# Patient Record
Sex: Female | Born: 1960 | Race: Black or African American | Hispanic: No | Smoking: Former smoker
Health system: Southern US, Community
[De-identification: ages and names within clinical notes are randomized; demographics above are authoritative.]

## PROBLEM LIST (undated history)

## (undated) DIAGNOSIS — F0390 Unspecified dementia without behavioral disturbance: Secondary | ICD-10-CM

## (undated) DIAGNOSIS — E0590 Thyrotoxicosis, unspecified without thyrotoxic crisis or storm: Secondary | ICD-10-CM

## (undated) DIAGNOSIS — I10 Essential (primary) hypertension: Secondary | ICD-10-CM

## (undated) DIAGNOSIS — E119 Type 2 diabetes mellitus without complications: Secondary | ICD-10-CM

## (undated) DIAGNOSIS — T7840XA Allergy, unspecified, initial encounter: Secondary | ICD-10-CM

## (undated) HISTORY — PX: NECK SURGERY: SHX720

## (undated) HISTORY — DX: Allergy, unspecified, initial encounter: T78.40XA

## (undated) HISTORY — DX: Essential (primary) hypertension: I10

## (undated) HISTORY — PX: KNEE SURGERY: SHX244

## (undated) HISTORY — PX: FRACTURE SURGERY: SHX138

## (undated) HISTORY — PX: SHOULDER SURGERY: SHX246

---

## 2016-05-14 NOTE — Congregational Nurse Program (Unsigned)
Congregational Nurse Program Note  Date of Encounter: 05/14/2016  Past Medical History: No past medical history on file.  Encounter Details:     CNP Questionnaire - 05/07/16 1143      Patient Demographics   Is this a new or existing patient? New   Patient is considered a/an Not Applicable   Race Caucasian/White     Patient Assistance   Location of Patient Assistance [facility]   Patient's financial/insurance status Low Income   Uninsured Patient ([place] [facility]/Care Connects) No   Food insecurities addressed Provided food supplies   Transportation assistance No   Assistance securing medications No   Educational health offerings Safety;Nutrition     Encounter Details   Primary purpose of visit Safety;Education/Health Concerns   Was an Emergency Department visit averted? Not Applicable   Does patient have a medical provider? No   Patient referred to Follow up with established PCP   Was a mental health screening completed? (GAINS tool) No   Does patient have dental issues? No   Does patient have vision issues? No   Does your patient have an abnormal blood pressure today? Yes   Since previous encounter, have you referred patient for abnormal blood pressure that resulted in a new diagnosis or medication change? No   Does your patient have an abnormal blood glucose today? No   Since previous encounter, have you referred patient for abnormal blood glucose that resulted in a new diagnosis or medication change? No   Was there a life-saving intervention made? No     Advised to see her Dr.( [place] and check on her B/p meds.  Bp 168/96  Pulse 96 [facility] Setting/Yutaka Holberg,RN  [phone]

## 2016-06-29 NOTE — Congregational Nurse Program (Unsigned)
Congregational Nurse Program Note  Date of Encounter: 06/29/2016  Past Medical History: No past medical history on file.  Encounter Details:     CNP Questionnaire - 06/26/16 1120      Patient Demographics   Is this a new or existing patient? Existing   Patient is considered a/an Not Applicable   Race Caucasian/White     Patient Assistance   Location of Patient Assistance [facility]   Patient's financial/insurance status Private Insurance Coverage   Uninsured Patient ([place] Card/Care Connects) No   Transportation assistance No   Assistance securing medications No   Product/process development scientistducational health offerings Navigating the healthcare system;Safety;Nutrition     Encounter Details   Primary purpose of visit Education/Health Concerns;Safety   Was an Emergency Department visit averted? Not Applicable   Does patient have a medical provider? No   Patient referred to [facility] Agency   Was a mental health screening completed? (GAINS tool) No   Does patient have dental issues? No   Does patient have vision issues? No   Does your patient have an abnormal blood pressure today? Yes   Since previous encounter, have you referred patient for abnormal blood pressure that resulted in a new diagnosis or medication change? Yes   Does your patient have an abnormal blood glucose today? No   Was there a life-saving intervention made? No    06/26/16- Trying to find Insurance Card(says she has 2 cards). Came here from [place]. [facility] Been walking and busy this AM  B/P 158/94 Pulse 98 . Return for recheck of B/P.  Benson SettingLeanna Lawson,RN [phone]

## 2016-09-01 ENCOUNTER — Ambulatory Visit: Payer: Self-pay | Admitting: Physician Assistant

## 2016-10-02 ENCOUNTER — Ambulatory Visit (INDEPENDENT_AMBULATORY_CARE_PROVIDER_SITE_OTHER): Payer: Self-pay | Admitting: Physician Assistant

## 2016-10-02 ENCOUNTER — Encounter (INDEPENDENT_AMBULATORY_CARE_PROVIDER_SITE_OTHER): Payer: Self-pay

## 2016-10-02 ENCOUNTER — Encounter: Payer: Self-pay | Admitting: Physician Assistant

## 2016-10-02 VITALS — BP 157/96 | HR 104 | Temp 97.7°F | Ht 67.0 in | Wt 183.8 lb

## 2016-10-02 DIAGNOSIS — I10 Essential (primary) hypertension: Secondary | ICD-10-CM | POA: Insufficient documentation

## 2016-10-02 DIAGNOSIS — T7840XA Allergy, unspecified, initial encounter: Secondary | ICD-10-CM

## 2016-10-02 DIAGNOSIS — J301 Allergic rhinitis due to pollen: Secondary | ICD-10-CM

## 2016-10-02 HISTORY — DX: Allergy, unspecified, initial encounter: T78.40XA

## 2016-10-02 MED ORDER — PROPRANOLOL HCL ER 80 MG PO CP24: 80.0000 mg | ORAL_CAPSULE | Freq: Every day | ORAL | 1 refills | Status: DC

## 2016-10-02 MED ORDER — LORATADINE 10 MG PO TABS: 10.0000 mg | ORAL_TABLET | Freq: Every day | ORAL | 11 refills | Status: DC

## 2016-10-02 NOTE — Patient Instructions (Signed)
Allergic Rhinitis Allergic rhinitis is when the mucous membranes in the nose respond to allergens. Allergens are particles in the air that cause your body to have an allergic reaction. This causes you to release allergic antibodies. Through a chain of events, these eventually cause you to release histamine into the blood stream. Although meant to protect the body, it is this release of histamine that causes your discomfort, such as frequent sneezing, congestion, and an itchy, runny nose. What are the causes? Seasonal allergic rhinitis (hay fever) is caused by pollen allergens that may come from grasses, trees, and weeds. Year-round allergic rhinitis (perennial allergic rhinitis) is caused by allergens such as house dust mites, pet dander, and mold spores. What are the signs or symptoms?  Nasal stuffiness (congestion).  Itchy, runny nose with sneezing and tearing of the eyes. How is this diagnosed? Your health care provider can help you determine the allergen or allergens that trigger your symptoms. If you and your health care provider are unable to determine the allergen, skin or blood testing may be used. Your health care provider will diagnose your condition after taking your health history and performing a physical exam. Your health care provider may assess you for other related conditions, such as asthma, pink eye, or an ear infection. How is this treated? Allergic rhinitis does not have a cure, but it can be controlled by:  Medicines that block allergy symptoms. These may include allergy shots, nasal sprays, and oral antihistamines.  Avoiding the allergen. Hay fever may often be treated with antihistamines in pill or nasal spray forms. Antihistamines block the effects of histamine. There are over-the-counter medicines that may help with nasal congestion and swelling around the eyes. Check with your health care provider before taking or giving this medicine. If avoiding the allergen or the  medicine prescribed do not work, there are many new medicines your health care provider can prescribe. Stronger medicine may be used if initial measures are ineffective. Desensitizing injections can be used if medicine and avoidance does not work. Desensitization is when a patient is given ongoing shots until the body becomes less sensitive to the allergen. Make sure you follow up with your health care provider if problems continue. Follow these instructions at home: It is not possible to completely avoid allergens, but you can reduce your symptoms by taking steps to limit your exposure to them. It helps to know exactly what you are allergic to so that you can avoid your specific triggers. Contact a health care provider if:  You have a fever.  You develop a cough that does not stop easily (persistent).  You have shortness of breath.  You start wheezing.  Symptoms interfere with normal daily activities. This information is not intended to replace advice given to you by your health care provider. Make sure you discuss any questions you have with your health care provider. Document Released: 01/13/2001 Document Revised: 12/20/2015 Document Reviewed: 12/26/2012 Elsevier Interactive Patient Education  2017 Elsevier Inc.  

## 2016-10-04 NOTE — Progress Notes (Signed)
BP (!) 157/96   Pulse (!) 104   Temp 97.7 F (36.5 C) (Oral)   Ht 5\' 7"  (1.702 m)   Wt 183 lb 12.8 oz (83.4 kg)   BMI 28.79 kg/m    Subjective:    Patient ID: Monica Ayers, female    DOB: Jul 12, 1960, 56 y.o.   MRN: [id number]  Monica Ayers is a 56 y.o. female presenting on 10/02/2016 for New Patient (Initial Visit)  HPI She is a new patient. Prior history positive for hypertension controlled by propranolol. States that she has had some rapid heart rate in the past.  Tolerated the medication well.  Also allergies are quite severe and needs claritin refilled.  There are no other complaints at this time.  Past Medical History:  Diagnosis Date  . Allergy   . Hypertension    Relevant past medical, surgical, family and social history reviewed and updated as indicated. Interim medical history since our last visit reviewed. Allergies and medications reviewed and updated.   Data reviewed from any sources in EPIC.  Review of Systems  Constitutional: Negative.  Negative for activity change, fatigue and fever.  HENT: Negative.   Eyes: Negative.   Respiratory: Negative.  Negative for cough.   Cardiovascular: Negative.  Negative for chest pain.  Gastrointestinal: Negative.  Negative for abdominal pain.  Endocrine: Negative.   Genitourinary: Negative.  Negative for dysuria.  Musculoskeletal: Negative.   Skin: Negative.   Neurological: Negative.      Social History   Social History  . Marital status: Married    Spouse name: N/A  . Number of children: N/A  . Years of education: N/A   Occupational History  . Not on file.   Social History Main Topics  . Smoking status: Current Every Day Smoker    Types: Cigarettes  . Smokeless tobacco: Never Used  . Alcohol use No  . Drug use: No  . Sexual activity: Not on file   Other Topics Concern  . Not on file   Social History Narrative  . No narrative on file    Past Surgical History:  Procedure Laterality Date  .  FRACTURE SURGERY    . KNEE SURGERY Right   . NECK SURGERY    . SHOULDER SURGERY Left     History reviewed. No pertinent family history.  Allergies as of 10/02/2016   No Known Allergies     Medication List       Accurate as of 10/02/16 11:59 PM. Always use your most recent med list.          loratadine 10 MG tablet Commonly known as:  CLARITIN Take 1 tablet (10 mg total) by mouth daily.   propranolol ER 80 MG 24 hr capsule Commonly known as:  INDERAL LA Take 1 capsule (80 mg total) by mouth daily.          Objective:    BP (!) 157/96   Pulse (!) 104   Temp 97.7 F (36.5 C) (Oral)   Ht 5\' 7"  (1.702 m)   Wt 183 lb 12.8 oz (83.4 kg)   BMI 28.79 kg/m   No Known Allergies Wt Readings from Last 3 Encounters:  10/02/16 183 lb 12.8 oz (83.4 kg)    Physical Exam  Constitutional: She is oriented to person, place, and time. She appears well-developed and well-nourished.  HENT:  Head: Normocephalic and atraumatic.  Right Ear: Tympanic membrane, external ear and ear canal normal.  Left Ear: Tympanic membrane, external ear  and ear canal normal.  Nose: Nose normal. No rhinorrhea.  Mouth/Throat: Oropharynx is clear and moist and mucous membranes are normal. No oropharyngeal exudate or posterior oropharyngeal erythema.  Eyes: Conjunctivae and EOM are normal. Pupils are equal, round, and reactive to light.  Neck: Normal range of motion. Neck supple.  Cardiovascular: Normal rate, regular rhythm, normal heart sounds and intact distal pulses.   Pulmonary/Chest: Effort normal and breath sounds normal.  Abdominal: Soft. Bowel sounds are normal.  Neurological: She is alert and oriented to person, place, and time. She has normal reflexes.  Skin: Skin is warm and dry. No rash noted.  Psychiatric: She has a normal mood and affect. Her behavior is normal. Judgment and thought content normal.  Nursing note and vitals reviewed.   No results found for this or any previous visit.      Assessment & Plan:   1. Essential hypertension - propranolol ER (INDERAL LA) 80 MG 24 hr capsule; Take 1 capsule (80 mg total) by mouth daily.  Dispense: 30 capsule; Refill: 1  2. Allergic state, initial encounter - loratadine (CLARITIN) 10 MG tablet; Take 1 tablet (10 mg total) by mouth daily.  Dispense: 30 tablet; Refill: 11   Continue all other maintenance medications as listed above. Educational handout given for hypertension  Follow up plan: Return in about 2 months (around 12/02/2016) for recheck.  Remus Loffler PA-C [facility] [address]  [place], [place] [address] [phone]   10/04/2016, 10:00 PM

## 2016-11-02 ENCOUNTER — Encounter: Payer: Self-pay | Admitting: Physician Assistant

## 2016-11-02 ENCOUNTER — Ambulatory Visit (INDEPENDENT_AMBULATORY_CARE_PROVIDER_SITE_OTHER): Payer: Medicare Other | Admitting: Physician Assistant

## 2016-11-02 VITALS — BP 146/94 | HR 109 | Temp 98.9°F | Ht 67.0 in | Wt 184.8 lb

## 2016-11-02 DIAGNOSIS — N926 Irregular menstruation, unspecified: Secondary | ICD-10-CM

## 2016-11-02 LAB — PREGNANCY, URINE: Preg Test, Ur: NEGATIVE

## 2016-11-03 NOTE — Progress Notes (Signed)
BP (!) 146/94   Pulse (!) 109   Temp 98.9 F (37.2 C) (Oral)   Ht 5\' 7"  (1.702 m)   Wt 184 lb 12.8 oz (83.8 kg)   BMI 28.94 kg/m    Subjective:    Patient ID: Monica Ayers, female    DOB: [birth year], 56 y.o.   MRN: [id number]  HPI: Monica Ayers is a 56 y.o. female presenting on 11/02/2016 for Possible Pregnancy (Last menstrual cycle years ago )  She has been 5 years without a menstrual cycle. She is newly married in the past few months. She has become sexually active again. She was concerned about being pregnant. She states her belly felt full. She has not had any bleeding whatsoever. She has not done a pregnancy test at home. Pregnancy test is performed in the office and is negative. We've had a long discussion about postmenopausal females not been able to get pregnant due to the lack of hormones and eggs being released.  Relevant past medical, surgical, family and social history reviewed and updated as indicated. Allergies and medications reviewed and updated.  Past Medical History:  Diagnosis Date  . Allergy   . Hypertension     Past Surgical History:  Procedure Laterality Date  . FRACTURE SURGERY    . KNEE SURGERY Right   . NECK SURGERY    . SHOULDER SURGERY Left     Review of Systems  Constitutional: Negative.   HENT: Negative.   Eyes: Negative.   Respiratory: Negative.   Gastrointestinal: Negative.   Genitourinary: Negative.     Allergies as of 11/02/2016   No Known Allergies     Medication List       Accurate as of 11/02/16 11:59 PM. Always use your most recent med list.          loratadine 10 MG tablet Commonly known as:  CLARITIN Take 1 tablet (10 mg total) by mouth daily.   propranolol ER 80 MG 24 hr capsule Commonly known as:  INDERAL LA Take 1 capsule (80 mg total) by mouth daily.          Objective:    BP (!) 146/94   Pulse (!) 109   Temp 98.9 F (37.2 C) (Oral)   Ht 5\' 7"  (1.702 m)   Wt 184 lb 12.8 oz (83.8 kg)   BMI 28.94  kg/m   No Known Allergies  Physical Exam  Constitutional: She is oriented to person, place, and time. She appears well-developed and well-nourished.  HENT:  Head: Normocephalic and atraumatic.  Eyes: Conjunctivae and EOM are normal. Pupils are equal, round, and reactive to light.  Cardiovascular: Normal rate, regular rhythm, normal heart sounds and intact distal pulses.   Pulmonary/Chest: Effort normal and breath sounds normal.  Abdominal: Soft. Bowel sounds are normal.  Neurological: She is alert and oriented to person, place, and time. She has normal reflexes.  Skin: Skin is warm and dry. No rash noted.  Psychiatric: She has a normal mood and affect. Her behavior is normal. Judgment and thought content normal.  Nursing note and vitals reviewed.   Results for orders placed or performed in visit on 11/02/16  Pregnancy, urine  Result Value Ref Range   Preg Test, Ur Negative Negative      Assessment & Plan:   1. Irregular menses NEG pregnancy test - Pregnancy, urine Educated on postmenopausal female not getting pregnant. Contact us if any changes in cycle  Continue all other maintenance medications as  listed above.  Follow up plan: Follow-up as needed or worsening of symptoms. Call office for any issues.   Educational handout given for survey  Remus LofflerAngel S. Kieran Nachtigal PA-C [facility] [address]  [place], [place] [address] [phone]   11/03/2016, 12:50 PM

## 2016-12-07 ENCOUNTER — Ambulatory Visit: Payer: Self-pay | Admitting: Physician Assistant

## 2016-12-08 ENCOUNTER — Encounter: Payer: Self-pay | Admitting: Physician Assistant

## 2016-12-08 ENCOUNTER — Telehealth: Payer: Self-pay | Admitting: Physician Assistant

## 2016-12-08 NOTE — Telephone Encounter (Signed)
Detailed message for patient to please call us back to reschedule appointment.

## 2017-02-05 LAB — BASIC METABOLIC PANEL: GLUCOSE: 210

## 2018-10-28 ENCOUNTER — Ambulatory Visit: Payer: Medicare Other | Admitting: Family Medicine

## 2018-10-28 NOTE — Progress Notes (Deleted)
Subjective: WP:[id number] care, hypertension HPI: Monica Ayers is a 58 y.o. female presenting to clinic today for:  1. Hypertension ***  Past Medical History:  Diagnosis Date  . Allergy   . Hypertension    Past Surgical History:  Procedure Laterality Date  . FRACTURE SURGERY    . KNEE SURGERY Right   . NECK SURGERY    . SHOULDER SURGERY Left    Social History   Socioeconomic History  . Marital status: Married    Spouse name: Not on file  . Number of children: Not on file  . Years of education: Not on file  . Highest education level: Not on file  Occupational History  . Not on file  Social Needs  . Financial resource strain: Not on file  . Food insecurity    Worry: Not on file    Inability: Not on file  . Transportation needs    Medical: Not on file    Non-medical: Not on file  Tobacco Use  . Smoking status: Current Every Day Smoker    Types: Cigarettes  . Smokeless tobacco: Never Used  Substance and Sexual Activity  . Alcohol use: No  . Drug use: No  . Sexual activity: Not on file  Lifestyle  . Physical activity    Days per week: Not on file    Minutes per session: Not on file  . Stress: Not on file  Relationships  . Social Herbalist on phone: Not on file    Gets together: Not on file    Attends religious service: Not on file    Active member of club or organization: Not on file    Attends meetings of clubs or organizations: Not on file    Relationship status: Not on file  . Intimate partner violence    Fear of current or ex partner: Not on file    Emotionally abused: Not on file    Physically abused: Not on file    Forced sexual activity: Not on file  Other Topics Concern  . Not on file  Social History Narrative  . Not on file   No outpatient medications have been marked as taking for the 10/28/18 encounter (Appointment) with Janora Norlander, DO.   No family history on file. No Known Allergies   Health Maintenance: ***  Flu Vaccine: {YES/NO/WILD NKNLZ:76734}  Tdap Vaccine: {YES/NO/WILD LPFXT:02409}  - every 31yrs - (<3 lifetime doses or unknown): all wounds -- look up need for Tetanus IG - (>=3 lifetime doses): clean/minor wound if >75yrs from previous; all other wounds if >71yrs from previous Zoster Vaccine: {YES/NO/WILD CARDS:18581} (those >50yo, once) Pneumonia Vaccine: {YES/NO/WILD BDZHG:99242} (those w/ risk factors) - (<47yr) Both: Immunocompromised, cochlear implant, CSF leak, asplenic, sickle cell, Chronic Renal Failure - (<47yr) PPSV-23 only: Heart dz, lung disease, DM, tobacco abuse, alcoholism, cirrhosis/liver disease. - (>28yr): PPSV13 then PPSV23 in 6-12mths;  - (>59yr): repeat PPSV23 once if pt received prior to 58yo and 87yrs have passed  ROS: Per HPI  Objective: Office vital signs reviewed. There were no vitals taken for this visit.  Physical Examination:  General: Awake, alert, *** nourished, No acute distress HEENT: Normal    Neck: No masses palpated. No lymphadenopathy    Ears: Tympanic membranes intact, normal light reflex, no erythema, no bulging    Eyes: PERRLA, extraocular movement in tact, sclera ***    Nose: nasal turbinates moist, *** nasal discharge    Throat: moist mucus membranes, no  erythema, *** tonsillar exudate.  Airway is patent Cardio: regular rate and rhythm, S1S2 heard, no murmurs appreciated Pulm: clear to auscultation bilaterally, no wheezes, rhonchi or rales; normal work of breathing on room air GI: soft, non-tender, non-distended, bowel sounds present x4, no hepatomegaly, no splenomegaly, no masses GU: external vaginal tissue ***, cervix ***, *** punctate lesions on cervix appreciated, *** discharge from cervical os, *** bleeding, *** cervical motion tenderness, *** abdominal/ adnexal masses Extremities: warm, well perfused, No edema, cyanosis or clubbing; +*** pulses bilaterally MSK: *** gait and *** station Skin: dry; intact; no rashes or lesions Neuro: ***  Strength and light touch sensation grossly intact, *** DTRs ***/4  Assessment/ Plan: 58 y.o. female   No problem-specific Assessment & Plan notes found for this encounter.   Monica Ayers Mruk, DO [facility] [phone]

## 2018-11-16 ENCOUNTER — Other Ambulatory Visit: Payer: Self-pay

## 2018-11-17 ENCOUNTER — Encounter (HOSPITAL_COMMUNITY): Payer: Self-pay | Admitting: Emergency Medicine

## 2018-11-17 ENCOUNTER — Ambulatory Visit: Payer: Medicare Other | Admitting: Family Medicine

## 2018-11-17 ENCOUNTER — Other Ambulatory Visit: Payer: Self-pay

## 2018-11-17 ENCOUNTER — Emergency Department (HOSPITAL_COMMUNITY): Payer: Medicare Other

## 2018-11-17 ENCOUNTER — Inpatient Hospital Stay (HOSPITAL_COMMUNITY)
Admission: EM | Admit: 2018-11-17 | Discharge: 2018-11-20 | DRG: 871 | Disposition: A | Discharge status: Home Health | Payer: Medicare Other | Attending: Internal Medicine | Admitting: Internal Medicine

## 2018-11-17 DIAGNOSIS — E119 Type 2 diabetes mellitus without complications: Secondary | ICD-10-CM

## 2018-11-17 DIAGNOSIS — Z1159 Encounter for screening for other viral diseases: Secondary | ICD-10-CM

## 2018-11-17 DIAGNOSIS — F17210 Nicotine dependence, cigarettes, uncomplicated: Secondary | ICD-10-CM | POA: Diagnosis present

## 2018-11-17 DIAGNOSIS — E872 Acidosis, unspecified: Secondary | ICD-10-CM | POA: Diagnosis present

## 2018-11-17 DIAGNOSIS — R402242 Coma scale, best verbal response, confused conversation, at arrival to emergency department: Secondary | ICD-10-CM | POA: Diagnosis present

## 2018-11-17 DIAGNOSIS — G3109 Other frontotemporal dementia: Secondary | ICD-10-CM | POA: Diagnosis present

## 2018-11-17 DIAGNOSIS — G9341 Metabolic encephalopathy: Secondary | ICD-10-CM | POA: Diagnosis present

## 2018-11-17 DIAGNOSIS — L89322 Pressure ulcer of left buttock, stage 2: Secondary | ICD-10-CM | POA: Diagnosis present

## 2018-11-17 DIAGNOSIS — A419 Sepsis, unspecified organism: Secondary | ICD-10-CM

## 2018-11-17 DIAGNOSIS — R740 Nonspecific elevation of levels of transaminase and lactic acid dehydrogenase [LDH]: Secondary | ICD-10-CM | POA: Diagnosis present

## 2018-11-17 DIAGNOSIS — E1165 Type 2 diabetes mellitus with hyperglycemia: Secondary | ICD-10-CM | POA: Diagnosis present

## 2018-11-17 DIAGNOSIS — L89312 Pressure ulcer of right buttock, stage 2: Secondary | ICD-10-CM | POA: Diagnosis present

## 2018-11-17 DIAGNOSIS — L89152 Pressure ulcer of sacral region, stage 2: Secondary | ICD-10-CM | POA: Diagnosis present

## 2018-11-17 DIAGNOSIS — F0280 Dementia in other diseases classified elsewhere without behavioral disturbance: Secondary | ICD-10-CM | POA: Diagnosis present

## 2018-11-17 DIAGNOSIS — R4182 Altered mental status, unspecified: Secondary | ICD-10-CM | POA: Diagnosis present

## 2018-11-17 DIAGNOSIS — Z7984 Long term (current) use of oral hypoglycemic drugs: Secondary | ICD-10-CM

## 2018-11-17 DIAGNOSIS — I639 Cerebral infarction, unspecified: Secondary | ICD-10-CM | POA: Diagnosis not present

## 2018-11-17 DIAGNOSIS — I10 Essential (primary) hypertension: Secondary | ICD-10-CM | POA: Diagnosis present

## 2018-11-17 DIAGNOSIS — R402362 Coma scale, best motor response, obeys commands, at arrival to emergency department: Secondary | ICD-10-CM | POA: Diagnosis present

## 2018-11-17 DIAGNOSIS — R402142 Coma scale, eyes open, spontaneous, at arrival to emergency department: Secondary | ICD-10-CM | POA: Diagnosis present

## 2018-11-17 DIAGNOSIS — R404 Transient alteration of awareness: Secondary | ICD-10-CM

## 2018-11-17 DIAGNOSIS — L8990 Pressure ulcer of unspecified site, unspecified stage: Secondary | ICD-10-CM | POA: Diagnosis present

## 2018-11-17 DIAGNOSIS — J302 Other seasonal allergic rhinitis: Secondary | ICD-10-CM | POA: Diagnosis present

## 2018-11-17 DIAGNOSIS — R7401 Elevation of levels of liver transaminase levels: Secondary | ICD-10-CM | POA: Diagnosis present

## 2018-11-17 DIAGNOSIS — Z91040 Latex allergy status: Secondary | ICD-10-CM | POA: Diagnosis not present

## 2018-11-17 HISTORY — DX: Sepsis, unspecified organism: A41.9

## 2018-11-17 HISTORY — DX: Acidosis, unspecified: E87.20

## 2018-11-17 HISTORY — DX: Type 2 diabetes mellitus without complications: E11.9

## 2018-11-17 HISTORY — DX: Acidosis: E87.2

## 2018-11-17 LAB — RAPID URINE DRUG SCREEN, HOSP PERFORMED
Amphetamines: NOT DETECTED
Barbiturates: NOT DETECTED
Benzodiazepines: NOT DETECTED
Cocaine: NOT DETECTED
Opiates: NOT DETECTED
Tetrahydrocannabinol: NOT DETECTED

## 2018-11-17 LAB — CBC WITH DIFFERENTIAL/PLATELET
Abs Immature Granulocytes: 0.13 10*3/uL — ABNORMAL HIGH (ref 0.00–0.07)
Basophils Absolute: 0.1 10*3/uL (ref 0.0–0.1)
Basophils Relative: 0 %
Eosinophils Absolute: 0 10*3/uL (ref 0.0–0.5)
Eosinophils Relative: 0 %
HCT: 44.5 % (ref 36.0–46.0)
Hemoglobin: 15.3 g/dL — ABNORMAL HIGH (ref 12.0–15.0)
Immature Granulocytes: 1 %
Lymphocytes Relative: 7 %
Lymphs Abs: 1.1 10*3/uL (ref 0.7–4.0)
MCH: 28.9 pg (ref 26.0–34.0)
MCHC: 34.4 g/dL (ref 30.0–36.0)
MCV: 84.1 fL (ref 80.0–100.0)
Monocytes Absolute: 0.8 10*3/uL (ref 0.1–1.0)
Monocytes Relative: 5 %
Neutro Abs: 13.8 10*3/uL — ABNORMAL HIGH (ref 1.7–7.7)
Neutrophils Relative %: 87 %
Platelets: 384 10*3/uL (ref 150–400)
RBC: 5.29 MIL/uL — ABNORMAL HIGH (ref 3.87–5.11)
RDW: 13.6 % (ref 11.5–15.5)
WBC: 15.9 10*3/uL — ABNORMAL HIGH (ref 4.0–10.5)
nRBC: 0 % (ref 0.0–0.2)

## 2018-11-17 LAB — COMPREHENSIVE METABOLIC PANEL
ALT: 52 U/L — ABNORMAL HIGH (ref 0–44)
AST: 56 U/L — ABNORMAL HIGH (ref 15–41)
Albumin: 3.6 g/dL (ref 3.5–5.0)
Alkaline Phosphatase: 73 U/L (ref 38–126)
Anion gap: 13 (ref 5–15)
BUN: 14 mg/dL (ref 6–20)
CO2: 21 mmol/L — ABNORMAL LOW (ref 22–32)
Calcium: 8.9 mg/dL (ref 8.9–10.3)
Chloride: 100 mmol/L (ref 98–111)
Creatinine, Ser: 0.63 mg/dL (ref 0.44–1.00)
GFR calc Af Amer: >60 mL/min (ref >60)
GFR calc non Af Amer: >60 mL/min (ref >60)
Glucose, Bld: 306 mg/dL — ABNORMAL HIGH (ref 70–99)
Potassium: 3.8 mmol/L (ref 3.5–5.1)
Sodium: 134 mmol/L — ABNORMAL LOW (ref 135–145)
Total Bilirubin: 0.8 mg/dL (ref 0.3–1.2)
Total Protein: 7 g/dL (ref 6.5–8.1)

## 2018-11-17 LAB — URINALYSIS, ROUTINE W REFLEX MICROSCOPIC
Bacteria, UA: NONE SEEN
Bilirubin Urine: NEGATIVE
Glucose, UA: >=500 mg/dL — AB
Hgb urine dipstick: NEGATIVE
Ketones, ur: NEGATIVE mg/dL
Leukocytes,Ua: NEGATIVE
Nitrite: NEGATIVE
Protein, ur: NEGATIVE mg/dL
Specific Gravity, Urine: 1.021 (ref 1.005–1.030)
pH: 5 (ref 5.0–8.0)

## 2018-11-17 LAB — LACTIC ACID, PLASMA
Lactic Acid, Venous: 3.3 mmol/L (ref 0.5–1.9)
Lactic Acid, Venous: 3.4 mmol/L (ref 0.5–1.9)

## 2018-11-17 LAB — CBG MONITORING, ED: Glucose-Capillary: 138 mg/dL — ABNORMAL HIGH (ref 70–99)

## 2018-11-17 LAB — MAGNESIUM: Magnesium: 2.1 mg/dL (ref 1.7–2.4)

## 2018-11-17 LAB — PROTIME-INR
INR: 1.1 (ref 0.8–1.2)
Prothrombin Time: 14 seconds (ref 11.4–15.2)

## 2018-11-17 LAB — APTT: aPTT: 29 seconds (ref 24–36)

## 2018-11-17 LAB — PHOSPHORUS: Phosphorus: 4.2 mg/dL (ref 2.5–4.6)

## 2018-11-17 LAB — TROPONIN I (HIGH SENSITIVITY)
Troponin I (High Sensitivity): 8 ng/L (ref <18)
Troponin I (High Sensitivity): 10 ng/L (ref <18)

## 2018-11-17 LAB — ACETAMINOPHEN LEVEL: Acetaminophen (Tylenol), Serum: <10 ug/mL — ABNORMAL LOW (ref 10–30)

## 2018-11-17 LAB — ETHANOL: Alcohol, Ethyl (B): <10 mg/dL (ref <10)

## 2018-11-17 LAB — AMMONIA: Ammonia: 17 umol/L (ref 9–35)

## 2018-11-17 LAB — SARS CORONAVIRUS 2 BY RT PCR (HOSPITAL ORDER, PERFORMED IN ~~LOC~~ HOSPITAL LAB): SARS Coronavirus 2: NEGATIVE

## 2018-11-17 LAB — SALICYLATE LEVEL: Salicylate Lvl: <7 mg/dL (ref 2.8–30.0)

## 2018-11-17 MED ORDER — VANCOMYCIN HCL IN DEXTROSE 1-5 GM/200ML-% IV SOLN: 1000.0000 mg | Freq: Two times a day (BID) | INTRAVENOUS | Status: DC

## 2018-11-17 MED ORDER — CHLORHEXIDINE GLUCONATE CLOTH 2 % EX PADS
6.0000 | MEDICATED_PAD | Freq: Every day | CUTANEOUS | Status: DC
  Administered 2018-11-18: 6 via TOPICAL

## 2018-11-17 MED ORDER — VANCOMYCIN HCL 10 G IV SOLR
1500.0000 mg | Freq: Once | INTRAVENOUS | Status: AC
  Administered 2018-11-17: 1500 mg via INTRAVENOUS
  Filled 2018-11-17 (×2): qty 1500

## 2018-11-17 MED ORDER — METRONIDAZOLE IN NACL 5-0.79 MG/ML-% IV SOLN
500.0000 mg | Freq: Three times a day (TID) | INTRAVENOUS | Status: DC
  Administered 2018-11-17 – 2018-11-18 (×2): 500 mg via INTRAVENOUS
  Filled 2018-11-17 (×2): qty 100

## 2018-11-17 MED ORDER — SODIUM CHLORIDE 0.9 % IV SOLN
2.0000 g | Freq: Once | INTRAVENOUS | Status: AC
  Administered 2018-11-17: 2 g via INTRAVENOUS
  Filled 2018-11-17: qty 2

## 2018-11-17 MED ORDER — SODIUM CHLORIDE 0.9 % IV BOLUS
1000.0000 mL | Freq: Once | INTRAVENOUS | Status: AC
  Administered 2018-11-17: 1000 mL via INTRAVENOUS

## 2018-11-17 MED ORDER — VANCOMYCIN HCL IN DEXTROSE 1-5 GM/200ML-% IV SOLN: 1000.0000 mg | Freq: Once | INTRAVENOUS | Status: DC

## 2018-11-17 MED ORDER — INSULIN ASPART 100 UNIT/ML ~~LOC~~ SOLN: 0.0000 [IU] | Freq: Three times a day (TID) | SUBCUTANEOUS | Status: DC

## 2018-11-17 MED ORDER — SODIUM CHLORIDE 0.9 % IV SOLN
2.0000 g | Freq: Three times a day (TID) | INTRAVENOUS | Status: DC
  Administered 2018-11-18: 2 g via INTRAVENOUS
  Filled 2018-11-17: qty 2

## 2018-11-17 MED ORDER — CHLORHEXIDINE GLUCONATE CLOTH 2 % EX PADS: 6.0000 | MEDICATED_PAD | Freq: Every day | CUTANEOUS | Status: DC

## 2018-11-17 MED ORDER — ENOXAPARIN SODIUM 40 MG/0.4ML ~~LOC~~ SOLN: 40.0000 mg | SUBCUTANEOUS | Status: DC

## 2018-11-17 MED ORDER — POTASSIUM CHLORIDE IN NACL 20-0.45 MEQ/L-% IV SOLN
INTRAVENOUS | Status: DC
  Administered 2018-11-17: via INTRAVENOUS
  Filled 2018-11-17: qty 1000

## 2018-11-17 MED ORDER — SODIUM CHLORIDE 0.9 % IV BOLUS (SEPSIS)
1000.0000 mL | Freq: Once | INTRAVENOUS | Status: AC
  Administered 2018-11-17: 1000 mL via INTRAVENOUS

## 2018-11-17 NOTE — H&P (Addendum)
History and Physical    Monica MassonRochelle M Ashby RUE:[id number] DOB: Sep 15, 1960 DOA: 11/17/2018  PCP: Remus LofflerJones, Angel S, PA-C   Patient coming from: Home.  I have personally briefly reviewed [facility]  Chief Complaint: AMS.  HPI: Monica Ayers is a 58 y.o. female with medical history significant of seasonal allergies, type 2 diabetes, hypertension who is coming to the emergency department brought by her husband due to 3 weeks of progressively worse mental status.  Apparently the patient has not gotten much out of bed, not eating much and not answering questions properly.  She is unable to provide further history.  Her husband denies any history of recent head trauma.  Fever, vomiting or diarrhea to his knowledge.  ED Course: Her initial vital signs were temperature  Her UDS was negative.  Urinalysis showed glucosuria more than 500 mg/dL, but was otherwise normal.  White count was 15.9 with 87% neutrophils, hemoglobin 15.3 g/dL and platelets [id number].  PT/INR/PTT within normal limits.  CMP shows a sodium of 134 and CO2 of 21 mmol/L.  The rest of the electrolytes and renal function are within normal limits.  Hepatic function shows mild elevation of AST and ALT of 56 and 52 units/L respectively, all other tests are within normal limits.  Her lactic acid has been elevated at 3.3 and later 3.4 mmol/L.  Alcohol, salicylate and acetaminophen were resulted within expected values.  SARS coronavirus swab was negative.  High-sensitivity troponin were negative.  TSH was normal.  Vitamin B12 was 315 which is low normal.  Imaging: Hypoventilatory changes and atelectasis were seen on chest radiograph, but no other cardiopulmonary abnormality.  CT head did not show any acute intracranial pathology, but show advanced frontotemporal volume loss.  She has moderate chronic small vessel ischemic disease.  Please see images and full radiology report for further detail.  Review of Systems:  As per HPI otherwise 10 point review of systems negative.   Past Medical History:  Diagnosis Date  . Allergy   . Diabetes mellitus without complication (HCC)   . Hypertension     Past Surgical History:  Procedure Laterality Date  . FRACTURE SURGERY    . KNEE SURGERY Right   . NECK SURGERY    . SHOULDER SURGERY Left      reports that she has been smoking cigarettes. She has never used smokeless tobacco. She reports that she does not drink alcohol or use drugs.  Allergies  Allergen Reactions  . Latex Rash   Family medical history. Unable to obtain.  Prior to Admission medications   Medication Sig Start Date End Date Taking? Authorizing Provider  metFORMIN (GLUCOPHAGE) 500 MG tablet Take 500 mg by mouth daily.  09/30/18 09/30/19 Yes [provider]  sulfamethoxazole-trimethoprim (BACTRIM DS) 800-160 MG tablet Take 1 tablet by mouth 2 (two) times daily. 5 day course prescribed on 11/08/2018   Yes [provider]    Physical Exam: Vitals:   11/17/18 2105 11/17/18 2115 11/17/18 2220 11/17/18 2300  BP:    (!) 151/91  Pulse: 96 100  94  Resp:    (!) 22  Temp:   98.8 F (37.1 C)   TempSrc:   Axillary   SpO2: 97% 95%  97%  Weight:   77.9 kg   Height:   5\' 7"  (1.702 m)     Constitutional: NAD, calm, comfortable Eyes: PERRL, lids and conjunctivae normal ENMT: Mucous membranes are mildly dry.  Partially absent and poor  state of repair dentition.  Posterior pharynx clear of any exudate or lesions. Neck: normal, supple, no masses, no thyromegaly Respiratory: Decreased breath sounds on bases, otherwise clear to auscultation bilaterally, no wheezing, no crackles. Normal respiratory effort. No accessory muscle use.  Cardiovascular: Regular rate and rhythm, no murmurs / rubs / gallops. No extremity edema. 2+ pedal pulses. No carotid bruits.  Abdomen: Soft, no tenderness, no masses palpated. No hepatosplenomegaly. Bowel sounds positive.  Musculoskeletal: no clubbing /  cyanosis. Good ROM, no contractures. Normal muscle tone.  Skin: Stage II  pressure injury of sacrum and buttocks. Neurologic: CN 2-12 grossly intact. Sensation intact, DTR normal. Strength 5/5 in all 4.  Psychiatric: Alert, did not answer questions appropriately.  She was unable to tell me her name.  Labs on Admission: I have personally reviewed following labs and imaging studies  CBC: Recent Labs  Lab 11/17/18 1529  WBC 15.9*  NEUTROABS 13.8*  HGB 15.3*  HCT 44.5  MCV 84.1  PLT 505   Basic Metabolic Panel: Recent Labs  Lab 11/17/18 1529  NA 134*  K 3.8  CL 100  CO2 21*  GLUCOSE 306*  BUN 14  CREATININE 0.63  CALCIUM 8.9  MG 2.1  PHOS 4.2   GFR: Estimated Creatinine Clearance: 82.4 mL/min (by C-G formula based on SCr of 0.63 mg/dL). Liver Function Tests: Recent Labs  Lab 11/17/18 1529  AST 56*  ALT 52*  ALKPHOS 73  BILITOT 0.8  PROT 7.0  ALBUMIN 3.6   No results for input(s): LIPASE, AMYLASE in the last 168 hours. Recent Labs  Lab 11/17/18 1752  AMMONIA 17   Coagulation Profile: Recent Labs  Lab 11/17/18 2000  INR 1.1   Cardiac Enzymes: No results for input(s): CKTOTAL, CKMB, CKMBINDEX, TROPONINI in the last 168 hours. BNP (last 3 results) No results for input(s): PROBNP in the last 8760 hours. HbA1C: No results for input(s): HGBA1C in the last 72 hours. CBG: Recent Labs  Lab 11/17/18 2123  GLUCAP 138*   Lipid Profile: No results for input(s): CHOL, HDL, LDLCALC, TRIG, CHOLHDL, LDLDIRECT in the last 72 hours. Thyroid Function Tests: No results for input(s): TSH, T4TOTAL, FREET4, T3FREE, THYROIDAB in the last 72 hours. Anemia Panel: No results for input(s): VITAMINB12, FOLATE, FERRITIN, TIBC, IRON, RETICCTPCT in the last 72 hours. Urine analysis:    Component Value Date/Time   COLORURINE YELLOW 11/17/2018 1747   APPEARANCEUR CLEAR 11/17/2018 1747   LABSPEC 1.021 11/17/2018 1747   PHURINE 5.0 11/17/2018 1747   GLUCOSEU >=500 (A)  11/17/2018 1747   HGBUR NEGATIVE 11/17/2018 1747   BILIRUBINUR NEGATIVE 11/17/2018 1747   KETONESUR NEGATIVE 11/17/2018 1747   PROTEINUR NEGATIVE 11/17/2018 1747   NITRITE NEGATIVE 11/17/2018 1747   LEUKOCYTESUR NEGATIVE 11/17/2018 1747    Radiological Exams on Admission: Ct Head Wo Contrast  Result Date: 11/17/2018 CLINICAL DATA:  Altered mental status. EXAM: CT HEAD WITHOUT CONTRAST TECHNIQUE: Contiguous axial images were obtained from the base of the skull through the vertex without intravenous contrast. COMPARISON:  11/05/2018 FINDINGS: Brain: There is no evidence of acute infarct, intracranial hemorrhage, mass, midline shift, or extra-axial fluid collection. Advanced bilateral frontal and temporal lobe volume loss is again seen. Patchy cerebral white matter hypodensities are unchanged and nonspecific but compatible with moderate chronic small vessel ischemic disease. 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 Vascular: Calcified atherosclerosis at the skull base. No hyperdense vessel. Skull: No fracture or focal osseous lesion. Sinuses/Orbits: Visualized paranasal sinuses and mastoid  air cells are clear. Visualized orbits are unremarkable. Other: None. IMPRESSION: 1. No evidence of acute intracranial abnormality. 2. Advanced frontotemporal volume loss. 3. Moderate chronic small vessel ischemic disease. Electronically Signed   By: Sebastian AcheAllen  Grady M.D.   On: 11/17/2018 19:05   Dg Chest Port 1 View  Result Date: 11/17/2018 CLINICAL DATA:  Altered level of consciousness EXAM: PORTABLE CHEST 1 VIEW COMPARISON:  Chest radiograph 10/06/2018 FINDINGS: Hypoventilatory changes in the lungs with vascular crowding. No consolidation, features of edema, pneumothorax, or effusion. Cardiomediastinal contours are unremarkable. No acute osseous or soft tissue abnormality. The likely postsurgical changes from prior left distal clavicular resection. Mass where the base the neck.  Cervical fusion hardware, largely collimated from view. IMPRESSION: Hypoventilatory changes and atelectasis, otherwise no acute cardiopulmonary abnormality Electronically Signed   By: Kreg ShropshirePrice  DeHay M.D.   On: 11/17/2018 16:39    EKG: Independently reviewed.  Vent. rate 100 BPM PR interval * ms QRS duration 87 ms QT/QTc 323/417 ms P-R-T axes 85 43 244 Sinus tachycardia Consider left atrial enlargement Abnormal R-wave progression, early transition Borderline repolarization abnormality  Assessment/Plan Principal Problem:   Sepsis due to undetermined organism (HCC) Admit to stepdown/inpatient. Continue IV fluids. Follow-up lactic acid level. Continue cefepime per pharmacy. Continue vancomycin per pharmacy. Continue metronidazole 500 mg IVPB every 8 hours. Follow-up blood cultures and sensitivity.  Active Problems:   Essential hypertension Not on medications at home. Monitor blood pressure. IV labetalol as needed.    Lactic acidosis Continue IV fluids. Follow-up lactic acid level.    Type 2 diabetes mellitus (HCC) Hyperglycemia has responded to IV fluids. Hold metformin due to lactic acidosis. Carbohydrate modified diet. CBG monitoring with regular insulin sliding scale.    Frontal lobe dementia (HCC) Likely the reason for the patient's inappropriate smiling/laughing. Check TSH and vitamin B12. Check MRI of brain.    Pressure injury of skin II   (present on admission) Continue local care.    Transaminasemia Follow AST and ALT.   DVT prophylaxis: On warfarin. Code Status: Full code. Family Communication: I spoke to her husband after seeing the patient in stepdown. Disposition Plan: Admit for sepsis work-up and treatment. Consults called: Admission status: Inpatient/stepdown.   Bobette Moavid Manuel Dmarius Reeder MD [facility]  If 7PM-7AM, please contact night-coverage [email or web address]  11/17/2018, 11:49 PM   This document was prepared using Dragon voice recognition  software and may contain some unintended transcription errors.

## 2018-11-17 NOTE — ED Provider Notes (Addendum)
[facility] EMERGENCY DEPARTMENT Provider Note   CSN: [id number] Arrival date & time: 11/17/18  1437     History   Chief Complaint Chief Complaint  Patient presents with  . Altered Mental Status    HPI Monica Ayers is a 58 y.o. female.     Patient is a 58 year old female with past medical history of NIDDM, HTN.  She was sent here from her PCP's office for evaluation of ALOC, possible UTI.  Patient appears confused and adds little useful history.  From what I understand, she [facility] and by her pcp for similar symptoms.  She appeared confused there, then was sent here.  Am I also told she is sleeping a lot at home and has been urinating on herself.  The history is provided by the patient.  Altered Mental Status Presenting symptoms: behavior changes and disorientation   Severity:  Moderate Episode history:  Continuous Timing:  Constant Progression:  Worsening Chronicity:  New   Past Medical History:  Diagnosis Date  . Allergy   . Diabetes mellitus without complication (HCC)   . Hypertension     Patient Active Problem List   Diagnosis Date Noted  . Essential hypertension 10/02/2016  . Allergic state 10/02/2016    Past Surgical History:  Procedure Laterality Date  . FRACTURE SURGERY    . KNEE SURGERY Right   . NECK SURGERY    . SHOULDER SURGERY Left      OB History   No obstetric history on file.      Home Medications    Prior to Admission medications   Medication Sig Start Date End Date Taking? Authorizing Provider  metFORMIN (GLUCOPHAGE) 500 MG tablet Take by mouth. 09/30/18 09/30/19 Yes [provider]  loratadine (CLARITIN) 10 MG tablet Take 1 tablet (10 mg total) by mouth daily. 10/02/16   Remus LofflerJones, Angel S, PA-C  propranolol ER (INDERAL LA) 80 MG 24 hr capsule Take 1 capsule (80 mg total) by mouth daily. 10/02/16   Remus LofflerJones, Angel S, PA-C    Family History No family history on file.  Social History Social History    Tobacco Use  . Smoking status: Current Every Day Smoker    Types: Cigarettes  . Smokeless tobacco: Never Used  Substance Use Topics  . Alcohol use: No  . Drug use: No     Allergies   Latex   Review of Systems Review of Systems  Unable to perform ROS: Mental status change     Physical Exam Updated Vital Signs BP (!) 145/84 (BP Location: Right Arm)   Pulse (!) 123   Temp 97.6 F (36.4 C) (Oral)   Resp 16   Ht 5\' 7"  (1.702 m)   Wt 83.9 kg   SpO2 96%   BMI 28.98 kg/m   Physical Exam Vitals signs and nursing note reviewed.  Constitutional:      General: She is not in acute distress.    Appearance: She is well-developed. She is ill-appearing and diaphoretic.  HENT:     Head: Normocephalic and atraumatic.     Mouth/Throat:     Mouth: Mucous membranes are moist.     Pharynx: No oropharyngeal exudate.  Neck:     Musculoskeletal: Normal range of motion and neck supple.  Cardiovascular:     Rate and Rhythm: Normal rate and regular rhythm.     Heart sounds: No murmur. No friction rub. No gallop.   Pulmonary:  Effort: Pulmonary effort is normal. No respiratory distress.     Breath sounds: Normal breath sounds. No wheezing.  Abdominal:     General: Bowel sounds are normal. There is no distension.     Palpations: Abdomen is soft.     Tenderness: There is no abdominal tenderness.  Musculoskeletal: Normal range of motion.  Skin:    General: Skin is warm.  Neurological:     Mental Status: She is alert.     Comments: Patient is awake and alert, but appears confused.  She moves all extremities, but remainder of neuro exam difficult secondary to aloc.      ED Treatments / Results  Labs (all labs ordered are listed, but only abnormal results are displayed) Labs Reviewed  COMPREHENSIVE METABOLIC PANEL  CBC WITH DIFFERENTIAL/PLATELET  ETHANOL  SALICYLATE LEVEL  ACETAMINOPHEN LEVEL  LACTIC ACID, PLASMA  LACTIC ACID, PLASMA  URINALYSIS, ROUTINE W REFLEX  MICROSCOPIC  RAPID URINE DRUG SCREEN, HOSP PERFORMED    EKG EKG Interpretation  Date/Time:  Thursday November 17 2018 20:18:41 EDT Ventricular Rate:  100 PR Interval:    QRS Duration: 87 QT Interval:  323 QTC Calculation: 417 R Axis:   43 Text Interpretation:  Sinus tachycardia Consider left atrial enlargement Abnormal R-wave progression, early transition Borderline repolarization abnormality Confirmed by Geoffery LyonseLo, Almena Hokenson ([id number]) on 11/17/2018 8:47:10 PM   Radiology No results found.  Procedures Procedures (including critical care time)  Medications Ordered in ED Medications  sodium chloride 0.9 % bolus 1,000 mL (has no administration in time range)     Initial Impression / Assessment and Plan / ED Course  I have reviewed the triage vital signs and the nursing notes.  Pertinent labs & imaging results that were available during my care of the patient were reviewed by me and considered in my medical decision making (see chart for details).  Patient is a 58 year old female presenting with a 3-week history of progressive weakness and confusion.  According to the patient's husband, she has not gotten out of bed in 3 weeks.  At one point, he attempted to get her to the couch in the living room, however she was so weak she fell over and landed on the floor.  Patient was seen at the doctor's office today, then referred here for further evaluation.  Upon my initial assessment, the patient appeared very pale and diaphoretic.  She has been tachycardic, but not febrile or hypotensive.  Patient's neurologic exam shows generalized slowing and her affect is somewhat unusual.  She has episodes of laughter that are somewhat inappropriate, but neurologic exam is otherwise nonfocal.  Her work-up shows no obvious abnormality.  Her blood sugar is somewhat elevated, but electrolytes are otherwise unremarkable.  She does have a white count of 15.9, the significance of which I am uncertain.  Her chest x-ray is  clear and urinalysis shows no infection.  She also has no drugs of abuse showing positive in her urine and alcohol level is undetectable.  At this point, patient has altered mental status, the etiology of which I am uncertain.  It does not appear as though this patient can go home secondary to the degree of her weakness.  I have discussed the care with Dr. Robb Matarrtiz who agrees to admit for further work-up and observation.  Final Clinical Impressions(s) / ED Diagnoses   Final diagnoses:  None    ED Discharge Orders    None       Geoffery Lyonselo, Lc Joynt, MD 11/17/18 2042  Veryl Speak, MD 11/17/18 2047

## 2018-11-17 NOTE — ED Triage Notes (Signed)
Pt brought in for aloc she is not walking she just voids on herself she was seen at [facility] on 11/08/2018 dx with a uti her doctor sent her over to be checked out

## 2018-11-17 NOTE — Progress Notes (Signed)
Lab called to make aware pts Lactic acid is critical at 3.4- from 1830. Heather RN, made aware via this nurse. Pt is due for another lactic acid any time.

## 2018-11-17 NOTE — ED Notes (Addendum)
CRITICAL VALUE ALERT  Critical Value:  Lactic 3.3  Date & Time Notied:  11/17/2018 1620  Provider Notified: Dr. Stark Jock  Orders Received/Actions taken: None yet

## 2018-11-17 NOTE — Progress Notes (Signed)
Pharmacy Antibiotic Note  MAIMOUNA RONDEAU is a 58 y.o. female admitted on 11/17/2018 with infection- unknown source.  Pharmacy has been consulted for Vancomycin and Cefepime dosing.  Plan: Vancomycin 1500 mg IV x 1 dose. Vancomycin 1000 mg IV every 12 hours.  Goal trough 15-20 mcg/mL. Cefepime 2000 mg IV every 8 hours. Monitor labs, c/s, and vanco levels as indicated.  Height: 5\' 7"  (170.2 cm) Weight: 185 lb (83.9 kg) IBW/kg (Calculated) : 61.6  Temp (24hrs), Avg:97.6 F (36.4 C), Min:97.6 F (36.4 C), Max:97.6 F (36.4 C)  Recent Labs  Lab 11/17/18 1529  WBC 15.9*  CREATININE 0.63  LATICACIDVEN 3.3*    Estimated Creatinine Clearance: 85.3 mL/min (by C-G formula based on SCr of 0.63 mg/dL).    Allergies  Allergen Reactions  . Latex Rash    Antimicrobials this admission: Vanco 7/16 >>  Cefepime 7/16 >>  Flagyl 7/16 >>  Dose adjustments this admission: N/A  Microbiology results: 7/16 BCx: pending     Thank you for allowing pharmacy to be a part of this patient's care.  Ramond Craver 11/17/2018 9:30 PM

## 2018-11-18 ENCOUNTER — Inpatient Hospital Stay (HOSPITAL_COMMUNITY): Payer: Medicare Other

## 2018-11-18 DIAGNOSIS — R7401 Elevation of levels of liver transaminase levels: Secondary | ICD-10-CM | POA: Diagnosis present

## 2018-11-18 DIAGNOSIS — I639 Cerebral infarction, unspecified: Secondary | ICD-10-CM

## 2018-11-18 DIAGNOSIS — L8990 Pressure ulcer of unspecified site, unspecified stage: Secondary | ICD-10-CM | POA: Diagnosis present

## 2018-11-18 HISTORY — DX: Pressure ulcer of unspecified site, unspecified stage: L89.90

## 2018-11-18 LAB — COMPREHENSIVE METABOLIC PANEL
ALT: 42 U/L (ref 0–44)
AST: 40 U/L (ref 15–41)
Albumin: 3.5 g/dL (ref 3.5–5.0)
Alkaline Phosphatase: 66 U/L (ref 38–126)
Anion gap: 10 (ref 5–15)
BUN: 7 mg/dL (ref 6–20)
CO2: 21 mmol/L — ABNORMAL LOW (ref 22–32)
Calcium: 8.4 mg/dL — ABNORMAL LOW (ref 8.9–10.3)
Chloride: 108 mmol/L (ref 98–111)
Creatinine, Ser: 0.51 mg/dL (ref 0.44–1.00)
GFR calc Af Amer: >60 mL/min (ref >60)
GFR calc non Af Amer: >60 mL/min (ref >60)
Glucose, Bld: 169 mg/dL — ABNORMAL HIGH (ref 70–99)
Potassium: 3.9 mmol/L (ref 3.5–5.1)
Sodium: 139 mmol/L (ref 135–145)
Total Bilirubin: 0.5 mg/dL (ref 0.3–1.2)
Total Protein: 7 g/dL (ref 6.5–8.1)

## 2018-11-18 LAB — BLOOD CULTURE ID PANEL (REFLEXED)

## 2018-11-18 LAB — LIPID PANEL
Cholesterol: 184 mg/dL (ref 0–200)
HDL: 34 mg/dL — ABNORMAL LOW (ref >40)
LDL Cholesterol: 111 mg/dL — ABNORMAL HIGH (ref 0–99)
Total CHOL/HDL Ratio: 5.4 RATIO
Triglycerides: 195 mg/dL — ABNORMAL HIGH (ref <150)
VLDL: 39 mg/dL (ref 0–40)

## 2018-11-18 LAB — GLUCOSE, CAPILLARY
Glucose-Capillary: 162 mg/dL — ABNORMAL HIGH (ref 70–99)
Glucose-Capillary: 186 mg/dL — ABNORMAL HIGH (ref 70–99)
Glucose-Capillary: 148 mg/dL — ABNORMAL HIGH (ref 70–99)
Glucose-Capillary: 146 mg/dL — ABNORMAL HIGH (ref 70–99)

## 2018-11-18 LAB — CBC WITH DIFFERENTIAL/PLATELET
Abs Immature Granulocytes: 0.06 10*3/uL (ref 0.00–0.07)
Basophils Absolute: 0 10*3/uL (ref 0.0–0.1)
Basophils Relative: 0 %
Eosinophils Absolute: 0.1 10*3/uL (ref 0.0–0.5)
Eosinophils Relative: 1 %
HCT: 42.3 % (ref 36.0–46.0)
Hemoglobin: 13.8 g/dL (ref 12.0–15.0)
Immature Granulocytes: 1 %
Lymphocytes Relative: 19 %
Lymphs Abs: 1.6 10*3/uL (ref 0.7–4.0)
MCH: 28.2 pg (ref 26.0–34.0)
MCHC: 32.6 g/dL (ref 30.0–36.0)
MCV: 86.5 fL (ref 80.0–100.0)
Monocytes Absolute: 0.6 10*3/uL (ref 0.1–1.0)
Monocytes Relative: 7 %
Neutro Abs: 6.1 10*3/uL (ref 1.7–7.7)
Neutrophils Relative %: 72 %
Platelets: 341 10*3/uL (ref 150–400)
RBC: 4.89 MIL/uL (ref 3.87–5.11)
RDW: 13.8 % (ref 11.5–15.5)
WBC: 8.5 10*3/uL (ref 4.0–10.5)
nRBC: 0 % (ref 0.0–0.2)

## 2018-11-18 LAB — MRSA PCR SCREENING: MRSA by PCR: NEGATIVE

## 2018-11-18 LAB — HEMOGLOBIN A1C
Hgb A1c MFr Bld: 6.7 % — ABNORMAL HIGH (ref 4.8–5.6)
Mean Plasma Glucose: 145.59 mg/dL

## 2018-11-18 LAB — LACTIC ACID, PLASMA
Lactic Acid, Venous: 1 mmol/L (ref 0.5–1.9)
Lactic Acid, Venous: 1.6 mmol/L (ref 0.5–1.9)

## 2018-11-18 LAB — PROCALCITONIN: Procalcitonin: <0.1 ng/mL

## 2018-11-18 LAB — VITAMIN B12: Vitamin B-12: 315 pg/mL (ref 180–914)

## 2018-11-18 LAB — TSH: TSH: 0.427 u[IU]/mL (ref 0.350–4.500)

## 2018-11-18 MED ORDER — INSULIN ASPART 100 UNIT/ML ~~LOC~~ SOLN
0.0000 [IU] | Freq: Three times a day (TID) | SUBCUTANEOUS | Status: DC
  Administered 2018-11-18: 1 [IU] via SUBCUTANEOUS
  Administered 2018-11-19: 2 [IU] via SUBCUTANEOUS
  Administered 2018-11-19: 3 [IU] via SUBCUTANEOUS
  Administered 2018-11-20: 2 [IU] via SUBCUTANEOUS

## 2018-11-18 MED ORDER — INSULIN ASPART 100 UNIT/ML ~~LOC~~ SOLN: 0.0000 [IU] | Freq: Every day | SUBCUTANEOUS | Status: DC

## 2018-11-18 MED ORDER — ASPIRIN EC 325 MG PO TBEC
325.0000 mg | DELAYED_RELEASE_TABLET | Freq: Every day | ORAL | Status: DC
  Administered 2018-11-18 – 2018-11-20 (×3): 325 mg via ORAL
  Filled 2018-11-18 (×3): qty 1

## 2018-11-18 MED ORDER — LORAZEPAM 2 MG/ML IJ SOLN
1.0000 mg | Freq: Once | INTRAMUSCULAR | Status: AC
  Administered 2018-11-18: 1 mg via INTRAMUSCULAR
  Filled 2018-11-18: qty 1

## 2018-11-18 MED ORDER — SODIUM CHLORIDE 0.9 % IV SOLN: INTRAVENOUS | Status: DC | PRN

## 2018-11-18 MED ORDER — ENOXAPARIN SODIUM 40 MG/0.4ML ~~LOC~~ SOLN
40.0000 mg | SUBCUTANEOUS | Status: DC
  Administered 2018-11-18 – 2018-11-19 (×2): 40 mg via SUBCUTANEOUS
  Filled 2018-11-18 (×2): qty 0.4

## 2018-11-18 MED ORDER — ATORVASTATIN CALCIUM 40 MG PO TABS
40.0000 mg | ORAL_TABLET | Freq: Every day | ORAL | Status: DC
  Administered 2018-11-18 – 2018-11-19 (×2): 40 mg via ORAL
  Filled 2018-11-18 (×2): qty 1

## 2018-11-18 MED ORDER — SODIUM CHLORIDE 0.9 % IV SOLN
INTRAVENOUS | Status: DC | PRN
  Administered 2018-11-18: 500 mL via INTRAVENOUS

## 2018-11-18 NOTE — Progress Notes (Signed)
PROGRESS NOTE    Monica MassonRochelle M Raus  ZOX:[id number] DOB: 06/24/1960 DOA: 11/17/2018 PCP: Remus LofflerJones, Angel S, PA-C   Brief Narrative:  Per HPI: Monica Ayers is a 58 y.o. female with medical history significant of seasonal allergies, type 2 diabetes, hypertension who is coming to the emergency department brought by her husband due to 3 weeks of progressively worse mental status.  Apparently the patient has not gotten much out of bed, not eating much and not answering questions properly.  She is unable to provide further history.  Her husband denies any history of recent head trauma.  Fever, vomiting or diarrhea to his knowledge.  Patient was admitted with acute encephalopathy suspected to be metabolic secondary to sepsis and was started on cefepime, vancomycin, and Flagyl.  She was noted to have elevated lactic acidosis as well.  She had remained on some IV fluids.  She has undergone MRI of the brain demonstrating acute, deep white matter CVAs this morning.  She is already noted to have frontal lobe dementia with prior closed head injury.  Assessment & Plan:   Principal Problem:   Sepsis due to undetermined organism [facility](HCC) Active Problems:   Essential hypertension   Lactic acidosis   Type 2 diabetes mellitus (HCC)   Frontal lobe dementia (HCC)   Pressure injury of skin   Transaminasemia   Acute encephalopathy secondary to bilateral deep white matter CVA in the setting of frontal lobe dementia -Further evaluation with 2D echocardiogram, carotid ultrasound, and lipid panel -Hemoglobin A1c noted to be 6.7% -We will start on full dose aspirin as well as statin -Appreciate neurology evaluation -Vitamin B12 and TSH within normal limits  Lactic acidosis-improving -Likely secondary to above with no sign of sepsis currently noted -We will continue to trend and discontinue antibiotics -Maintain on IV fluid  Type 2 diabetes with mild hyperglycemia -Continue to hold metformin due to lactic  acidosis -Maintain on carb modified diet -Started SSI  Essential hypertension -Allow permissive hypertension for now with no medications noted at home  Transaminitis -Improved on repeat labs and will start statin -Recheck levels in a.m.   DVT prophylaxis: Lovenox Code Status: Full Family Communication: We will plan to call husband Disposition Plan: Continue CVA evaluation with neurology consulted.  Discontinue antibiotics.   Consultants:   Neurology  Procedures:   Brain MRI 7/17  Antimicrobials:  Anti-infectives (From admission, onward)   Start     Dose/Rate Route Frequency Ordered Stop   11/18/18 1100  vancomycin (VANCOCIN) IVPB 1000 mg/200 mL premix  Status:  Discontinued     1,000 mg 200 mL/hr over 60 Minutes Intravenous Every 12 hours 11/17/18 2126 11/18/18 0942   11/18/18 0600  ceFEPIme (MAXIPIME) 2 g in sodium chloride 0.9 % 100 mL IVPB  Status:  Discontinued     2 g 200 mL/hr over 30 Minutes Intravenous Every 8 hours 11/17/18 2123 11/18/18 1206   11/17/18 2100  ceFEPIme (MAXIPIME) 2 g in sodium chloride 0.9 % 100 mL IVPB     2 g 200 mL/hr over 30 Minutes Intravenous  Once 11/17/18 2009 11/17/18 2335   11/17/18 2100  metroNIDAZOLE (FLAGYL) IVPB 500 mg  Status:  Discontinued     500 mg 100 mL/hr over 60 Minutes Intravenous Every 8 hours 11/17/18 2009 11/18/18 1206   11/17/18 2030  vancomycin (VANCOCIN) IVPB 1000 mg/200 mL premix  Status:  Discontinued     1,000 mg 200 mL/hr over 60 Minutes Intravenous  Once 11/17/18 2009 11/17/18 2015  11/17/18 2015  vancomycin (VANCOCIN) 1,500 mg in sodium chloride 0.9 % 500 mL IVPB     1,500 mg 250 mL/hr over 120 Minutes Intravenous  Once 11/17/18 2015 11/18/18 0111      Subjective: Patient seen and evaluated today and appears to be in good spirits.  Difficult to assess actual clinical condition given patient condition.  Objective: Vitals:   11/18/18 0600 11/18/18 0700 11/18/18 0815 11/18/18 1049  BP: (!) 155/83 (!)  164/79  (!) 167/86  Pulse: 89 89  94  Resp: 15 15  20   Temp:   98.2 F (36.8 C) 98.4 F (36.9 C)  TempSrc:   Oral   SpO2: 95% 94%  98%  Weight:      Height:        Intake/Output Summary (Last 24 hours) at 11/18/2018 1211 Last data filed at 11/18/2018 0900 Gross per 24 hour  Intake 2761.84 ml  Output --  Net 2761.84 ml   Filed Weights   11/17/18 1441 11/17/18 2220 11/18/18 0500  Weight: 83.9 kg 77.9 kg 77.9 kg    Examination:  General exam: Appears calm and comfortable, appears confused Respiratory system: Clear to auscultation. Respiratory effort normal. Cardiovascular system: S1 & S2 heard, RRR. No JVD, murmurs, rubs, gallops or clicks. No pedal edema. Gastrointestinal system: Abdomen is nondistended, soft and nontender. No organomegaly or masses felt. Normal bowel sounds heard. Central nervous system: Alert and awake.  Confused Extremities: Symmetric 5 x 5 power. Skin: No rashes, lesions or ulcers Psychiatry: Difficult to assess    Data Reviewed: I have personally reviewed following labs and imaging studies  CBC: Recent Labs  Lab 11/17/18 1529 11/18/18 0800  WBC 15.9* 8.5  NEUTROABS 13.8* 6.1  HGB 15.3* 13.8  HCT 44.5 42.3  MCV 84.1 86.5  PLT 384 341   Basic Metabolic Panel: Recent Labs  Lab 11/17/18 1529 11/18/18 0800  NA 134* 139  K 3.8 3.9  CL 100 108  CO2 21* 21*  GLUCOSE 306* 169*  BUN 14 7  CREATININE 0.63 0.51  CALCIUM 8.9 8.4*  MG 2.1  --   PHOS 4.2  --    GFR: Estimated Creatinine Clearance: 82.4 mL/min (by C-G formula based on SCr of 0.51 mg/dL). Liver Function Tests: Recent Labs  Lab 11/17/18 1529 11/18/18 0800  AST 56* 40  ALT 52* 42  ALKPHOS 73 66  BILITOT 0.8 0.5  PROT 7.0 7.0  ALBUMIN 3.6 3.5   No results for input(s): LIPASE, AMYLASE in the last 168 hours. Recent Labs  Lab 11/17/18 1752  AMMONIA 17   Coagulation Profile: Recent Labs  Lab 11/17/18 2000  INR 1.1   Cardiac Enzymes: No results for input(s):  CKTOTAL, CKMB, CKMBINDEX, TROPONINI in the last 168 hours. BNP (last 3 results) No results for input(s): PROBNP in the last 8760 hours. HbA1C: Recent Labs    11/17/18 1529  HGBA1C 6.7*   CBG: Recent Labs  Lab 11/17/18 2123 11/18/18 0806 11/18/18 1122  GLUCAP 138* 162* 186*   Lipid Profile: No results for input(s): CHOL, HDL, LDLCALC, TRIG, CHOLHDL, LDLDIRECT in the last 72 hours. Thyroid Function Tests: Recent Labs    11/17/18 2059  TSH 0.427   Anemia Panel: Recent Labs    11/17/18 2059  VITAMINB12 315   Sepsis Labs: Recent Labs  Lab 11/17/18 1529 11/17/18 2057 11/18/18 0225 11/18/18 0800  PROCALCITON  --   --   --  <0.10  LATICACIDVEN 3.3* 3.4* 1.0 1.6    Recent  Results (from the past 240 hour(s))  SARS Coronavirus 2 (CEPHEID - Performed in [facility] hospital lab), Hosp Order     Status: None   Collection Time: 11/17/18  8:40 PM   Specimen: Nasopharyngeal Swab  Result Value Ref Range Status   SARS Coronavirus 2 NEGATIVE NEGATIVE Final    Comment: (NOTE) If result is NEGATIVE SARS-CoV-2 target nucleic acids are NOT DETECTED. The SARS-CoV-2 RNA is generally detectable in upper and lower  respiratory specimens during the acute phase of infection. The lowest  concentration of SARS-CoV-2 viral copies this assay can detect is 250  copies / mL. A negative result does not preclude SARS-CoV-2 infection  and should not be used as the sole basis for treatment or other  patient management decisions.  A negative result may occur with  improper specimen collection / handling, submission of specimen other  than nasopharyngeal swab, presence of viral mutation(s) within the  areas targeted by this assay, and inadequate number of viral copies  (<250 copies / mL). A negative result must be combined with clinical  observations, patient history, and epidemiological information. If result is POSITIVE SARS-CoV-2 target nucleic acids are DETECTED. The SARS-CoV-2 RNA is  generally detectable in upper and lower  respiratory specimens dur ing the acute phase of infection.  Positive  results are indicative of active infection with SARS-CoV-2.  Clinical  correlation with patient history and other diagnostic information is  necessary to determine patient infection status.  Positive results do  not rule out bacterial infection or co-infection with other viruses. If result is PRESUMPTIVE POSTIVE SARS-CoV-2 nucleic acids MAY BE PRESENT.   A presumptive positive result was obtained on the submitted specimen  and confirmed on repeat testing.  While 2019 novel coronavirus  (SARS-CoV-2) nucleic acids may be present in the submitted sample  additional confirmatory testing may be necessary for epidemiological  and / or clinical management purposes  to differentiate between  SARS-CoV-2 and other Sarbecovirus currently known to infect humans.  If clinically indicated additional testing with an alternate test  methodology [id number]) is advised. The SARS-CoV-2 RNA is generally  detectable in upper and lower respiratory sp ecimens during the acute  phase of infection. The expected result is Negative. Fact Sheet for Patients:  BoilerBrush.com.cy Fact Sheet for Healthcare Providers: https://pope.com/ This test is not yet approved or cleared by the [place] FDA and has been authorized for detection and/or diagnosis of SARS-CoV-2 by FDA under an Emergency Use Authorization (EUA).  This EUA will remain in effect (meaning this test can be used) for the duration of the COVID-19 declaration under Section 564(b)(1) of the Act, 21 U.S.C. section 360bbb-3(b)(1), unless the authorization is terminated or revoked sooner. Performed at [facility], [address] [place], [place] [address]   Culture, blood (x 2)     Status: None (Preliminary result)   Collection Time: 11/17/18  8:57 PM   Specimen: BLOOD  Result Value Ref Range  Status   Specimen Description BLOOD LEFT ANTECUBITAL  Final   Special Requests   Final    BOTTLES DRAWN AEROBIC AND ANAEROBIC Blood Culture adequate volume   Culture   Final    NO GROWTH < 12 HOURS Performed at [facility], [address] [place], [place] [address]    Report Status PENDING  Incomplete  Culture, blood (x 2)     Status: None (Preliminary result)   Collection Time: 11/17/18  8:59 PM   Specimen: BLOOD  Result Value Ref Range Status  Specimen Description BLOOD BLOOD RIGHT WRIST  Final   Special Requests   Final    BOTTLES DRAWN AEROBIC AND ANAEROBIC Blood Culture adequate volume   Culture   Final    NO GROWTH < 12 HOURS Performed at [facility], [address], [place], [place] [address]    Report Status PENDING  Incomplete  MRSA PCR Screening     Status: None   Collection Time: 11/17/18 10:22 PM   Specimen: Nasal Mucosa; Nasopharyngeal  Result Value Ref Range Status   MRSA by PCR NEGATIVE NEGATIVE Final    Comment:        The GeneXpert MRSA Assay (FDA approved for NASAL specimens only), is one component of a comprehensive MRSA colonization surveillance program. It is not intended to diagnose MRSA infection nor to guide or monitor treatment for MRSA infections. Performed at [facility], [address] [place], [place] [address]          Radiology Studies: Ct Head Wo Contrast  Result Date: 11/17/2018 CLINICAL DATA:  Altered mental status. EXAM: CT HEAD WITHOUT CONTRAST TECHNIQUE: Contiguous axial images were obtained from the base of the skull through the vertex without intravenous contrast. COMPARISON:  11/05/2018 FINDINGS: Brain: There is no evidence of acute infarct, intracranial hemorrhage, mass, midline shift, or extra-axial fluid collection. Advanced bilateral frontal and temporal lobe volume loss is again seen. Patchy cerebral white matter hypodensities are unchanged and nonspecific but compatible with moderate chronic small vessel ischemic disease.  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 Vascular: Calcified atherosclerosis at the skull base. No hyperdense vessel. Skull: No fracture or focal osseous lesion. Sinuses/Orbits: Visualized paranasal sinuses and mastoid air cells are clear. Visualized orbits are unremarkable. Other: None. IMPRESSION: 1. No evidence of acute intracranial abnormality. 2. Advanced frontotemporal volume loss. 3. Moderate chronic small vessel ischemic disease. Electronically Signed   By: Sebastian Ache M.D.   On: 11/17/2018 19:05   Mr Angio Head Wo Contrast  Result Date: 11/18/2018 CLINICAL DATA:  Acute presentation yesterday with altered mental status. EXAM: MRI HEAD WITHOUT CONTRAST MRA HEAD WITHOUT CONTRAST TECHNIQUE: Multiplanar, multiecho pulse sequences of the brain and surrounding structures were obtained without intravenous contrast. Angiographic images of the head were obtained using MRA technique without contrast. COMPARISON:  Head CT 11/17/2018 FINDINGS: MRI HEAD FINDINGS Brain: The study suffers from considerable motion degradation. Diffusion imaging suggests some acute or subacute deep white matter infarctions in both cerebral hemispheres in the parietal regions as might be seen with hypoperfusion/watershed infarction. There may be some T2 shine through effect, but I think at least 1 focus in the right parietal region convincingly relates to acute white matter infarction. No focal insult affects the brainstem or cerebellum. Cerebral hemispheres elsewhere show pronounced atrophy and gliosis of both frontal lobes and temporal tips suggesting previous close head injury. Chronic small-vessel ischemic changes affect the cerebral hemispheric white matter elsewhere. No evidence of obstructive hydrocephalus or extra-axial collection. Vascular: Major vessels at the base of the brain show flow. Skull and upper cervical spine: Negative Sinuses/Orbits: Clear/normal Other: None MRA HEAD FINDINGS Both  internal carotid arteries are patent through the skull base and siphon regions. There is artifactual signal loss in the circle-of-Willis region due to motion visible on the source images. I believe the anterior and middle cerebral vessels are widely patent. Both vertebral arteries are patent to the basilar. No basilar stenosis. As above, posterior circulation branch vessels appear patent but are poorly seen because of motion  artifact. IMPRESSION: Scattered foci restricted diffusion in the parietal white matter on both sides consistent with acute deep white matter infarctions. Bilateral nature suggests that this could be due to hypoperfusion or watershed insults. Some of the appearance may relate to T2 shine through, but at least 1 of the foci in the right parietal region show definite low signal on the ADC map. 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 Background pattern of chronic small vessel ischemic changes of the white matter as seen previously. Motion degraded MR angiography. I do not think there is any large or medium vessel occlusion or true proximal stenosis. Signal loss in the circle-of-Willis region relates to motion on the source images. Electronically Signed   By: Nelson Chimes M.D.   On: 11/18/2018 10:28   Mr Brain Wo Contrast  Result Date: 11/18/2018 CLINICAL DATA:  Acute presentation yesterday with altered mental status. EXAM: MRI HEAD WITHOUT CONTRAST MRA HEAD WITHOUT CONTRAST TECHNIQUE: Multiplanar, multiecho pulse sequences of the brain and surrounding structures were obtained without intravenous contrast. Angiographic images of the head were obtained using MRA technique without contrast. COMPARISON:  Head CT 11/17/2018 FINDINGS: MRI HEAD FINDINGS Brain: The study suffers from considerable motion degradation. Diffusion imaging suggests some acute or subacute deep white matter infarctions in both cerebral hemispheres in the parietal regions  as might be seen with hypoperfusion/watershed infarction. There may be some T2 shine through effect, but I think at least 1 focus in the right parietal region convincingly relates to acute white matter infarction. No focal insult affects the brainstem or cerebellum. Cerebral hemispheres elsewhere show pronounced atrophy and gliosis of both frontal lobes and temporal tips suggesting previous close head injury. Chronic small-vessel ischemic changes affect the cerebral hemispheric white matter elsewhere. No evidence of obstructive hydrocephalus or extra-axial collection. Vascular: Major vessels at the base of the brain show flow. Skull and upper cervical spine: Negative Sinuses/Orbits: Clear/normal Other: None MRA HEAD FINDINGS Both internal carotid arteries are patent through the skull base and siphon regions. There is artifactual signal loss in the circle-of-Willis region due to motion visible on the source images. I believe the anterior and middle cerebral vessels are widely patent. Both vertebral arteries are patent to the basilar. No basilar stenosis. As above, posterior circulation branch vessels appear patent but are poorly seen because of motion artifact. IMPRESSION: Scattered foci restricted diffusion in the parietal white matter on both sides consistent with acute deep white matter infarctions. Bilateral nature suggests that this could be due to hypoperfusion or watershed insults. Some of the appearance may relate to T2 shine through, but at least 1 of the foci in the right parietal region show definite low signal on the ADC map. 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 Background pattern of chronic small vessel ischemic changes of the white matter as seen previously. Motion degraded MR angiography. I do not think there is any large or medium vessel occlusion or true proximal stenosis. Signal loss in the circle-of-Willis region relates to motion on the  source images. Electronically Signed   By: Nelson Chimes M.D.   On: 11/18/2018 10:28   Dg Chest Port 1 View  Result Date: 11/17/2018 CLINICAL DATA:  Altered level of consciousness EXAM: PORTABLE CHEST 1 VIEW COMPARISON:  Chest radiograph 10/06/2018 FINDINGS: Hypoventilatory changes in the lungs with vascular crowding. No consolidation, features of edema, pneumothorax, or effusion. Cardiomediastinal contours are unremarkable. No acute osseous or soft tissue  abnormality. The likely postsurgical changes from prior left distal clavicular resection. Mass where the base the neck. Cervical fusion hardware, largely collimated from view. IMPRESSION: Hypoventilatory changes and atelectasis, otherwise no acute cardiopulmonary abnormality Electronically Signed   By: Kreg ShropshirePrice  DeHay M.D.   On: 11/17/2018 16:39        Scheduled Meds:  aspirin EC  325 mg Oral Daily   atorvastatin  40 mg Oral q1800   Chlorhexidine Gluconate Cloth  6 each Topical Q0600   insulin aspart  0-5 Units Subcutaneous QHS   insulin aspart  0-9 Units Subcutaneous TID WC   Continuous Infusions:  sodium chloride       LOS: 1 day    Time spent: 30 minutes    Cortlandt Capuano Hoover Brunette Lemon Whitacre, DO [facility] Pager [phone]  If 7PM-7AM, please contact night-coverage [email or web address] Password [email or web address] 11/18/2018, 12:11 PM

## 2018-11-18 NOTE — TOC Initial Note (Signed)
Transition of Care [facility]) - Initial/Assessment Note    Patient Details  Name: Monica Ayers MRN: [id number] Date of Birth: Mar 21, 1961  Transition of Care [facility]) CM/SW Contact:    Boneta Lucks, RN Phone Number: 11/18/2018, 9:38 AM  Clinical Narrative:  Patient admitted with sepsis. Patient has been confused, Spoke with husband. She lives at home with him.  Disabled by usually ambulatory. Patient has no DME's,  He is requesting a wheelchair due to weakness and needing to get her from room to room. PT will evaluated. Spouse also interested in [facility], has no preferences, list reviewed. CM called Tim with [facility], TOC will update him with plan of care and discharge date.                    Expected Discharge Plan: [place] Barriers to Discharge: Continued Medical Work up   Patient Goals and CMS Choice Patient states their goals for this hospitalization and ongoing recovery are:: to go home. CMS Medicare.gov Compare Post Acute Care list provided to:: Other (Comment Required)(husband) Choice offered to / list presented to : Spouse  Expected Discharge Plan and Services Expected Discharge Plan: [place]   Discharge Planning Services: CM Consult Post Acute Care Choice: [place] arrangements for the past 2 months: Single Family Home                           HH Arranged: RN, PT [place] Agency: [facility] (formerly [employer]) Date [place]: 11/18/18 Time Bud: [phone] Representative spoke with at [place]: Richrd Prime  Prior Living Arrangements/Services Living arrangements for the past 2 months: [place] with:: Spouse Patient language and need for interpreter reviewed:: Yes Do you feel safe going back to the place where you live?: Yes      Need for Family Participation in Patient Care: Yes (Comment) Care giver support system in place?: Yes (comment)   Criminal Activity/Legal  Involvement Pertinent to Current Situation/Hospitalization: No - Comment as needed  Activities of Daily Living      Permission Sought/Granted Permission sought to share information with : Case Manager, Family Supports, Other (comment)(HH)    Share Information with NAME: Automotive engineer  Permission granted to share info w AGENCY: [facility]        Emotional Assessment       Orientation: : Oriented to Self Alcohol / Substance Use: Not Applicable Psych Involvement: No (comment)  Admission diagnosis:  Altered consciousness [R40.4] Altered mental status, unspecified altered mental status type [R41.82] Patient Active Problem List   Diagnosis Date Noted  . Pressure injury of skin 11/18/2018  . Transaminasemia 11/18/2018  . Sepsis due to undetermined organism ([place]) 11/17/2018  . Lactic acidosis 11/17/2018  . Type 2 diabetes mellitus ([place]) 11/17/2018  . Frontal lobe dementia ([place]) 11/17/2018  . Essential hypertension 10/02/2016  . Allergic state 10/02/2016   PCP:  Terald Sleeper, PA-C Pharmacy:   [place], [place] [address] Phone: [phone] Fax: [phone]        Readmission Risk Interventions No flowsheet data found.

## 2018-11-18 NOTE — Care Management Important Message (Signed)
Important Message  Patient Details  Name: Monica Ayers MRN: [id number] Date of Birth: 1960/10/03   Medicare Important Message Given:  Yes     [name] 11/18/2018, 3:50 PM

## 2018-11-18 NOTE — Progress Notes (Signed)
CRITICAL VALUE ALERT  Critical Value:  Blood culture: gram positive cocci, staph positive, MSSA positive per lab  Date & Time Notied:  11/18/2018 [place]  Provider Notified: Dr. Manuella Ghazi  Orders Received/Actions taken: awaiting further orders

## 2018-11-18 NOTE — Progress Notes (Signed)
PT Cancellation Note  Patient Details Name: EVAMARIE RAETZ MRN: [id number] DOB: 1960/11/06   Cancelled Treatment:    Reason Eval/Treat Not Completed: Patient at procedure or test/unavailable.  Unable to see patient secondary to having a procedure.     3:39 PM, 11/18/18 Lonell Grandchild, MPT Physical Therapist with [facility] [phone] office [phone] mobile phone

## 2018-11-18 NOTE — Progress Notes (Signed)
*  PRELIMINARY RESULTS* Echocardiogram 2D Echocardiogram has been performed with saline bubble study.  Samuel Germany 11/18/2018, 3:22 PM

## 2018-11-18 NOTE — Consult Note (Signed)
[facility] Alanta Scobey A. Gerilyn Pilgrimoonquah, MD     [email or web address]          Vicente MassonRochelle M Alvi is an 58 y.o. female.   ASSESSMENT/PLAN: 1.  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  She does have bilateral small infarcts suggestive of embolic phenomena.  These are potentially causative but unlikely to explain the severe encephalopathy on examination.  It is more that the contribute to the encephalopathy. Aspirin 350 mg has been initiated which is appropriate.  Consider stenting also.  Additional labs for dementia will also be obtained.  She does have low vitamin B12 and I would suggest replacing this.  An EEG will also be obtained.  2.  There is some subtle suggestion of increased tone in the legs and leg weakness suggestive of myelopathy.  Cervical spine MRI will be considered.  3.  Remote bilateral infarcts  The patient is a 58 year old white female who presents with a 2 to 3-week history of progressive alteration in mentation.  The history is obtained from the husband via phone and also reviewing the chart.  It appears that she essentially did not want to get out of bed.  She also refused to eat and was overall not engaging in usual activity.  The husband tells me that he has been married to her for about 2 years.  She seems to have episodic spells of confusion over the last 2 years but this time it seems to be much worse.  There is no other significant history available.  She has 3 kids but they do not keep in contact with her.  They apparently have their own issues with drug abuse.  No other family history is available at this time.  She may have had a history of a head injury in the past but this is unclear.  The husband cannot really describe this.  He essentially knows very little about her medical history.  The patient is quite confused at this time and is only able to provide a adequate history.  GENERAL: Patient is being fed by  the nursing assistant.  She is quite confused and resist the evaluation at times but at times she does cooperate with the evaluation.  HEENT: The neck is supple and no obvious trauma appreciated.  ABDOMEN: soft  EXTREMITIES: No edema   BACK: Normal  SKIN: Normal by inspection.    MENTAL STATUS: She is awake and alert but but speech is completely nonsensical and completely disoriented.  She has significant limitation in comprehending and does not follow commands consistently.  She is unable to name any objects largely because she has severe impairment in comprehension.  There appears to be mild dysarthria.  CRANIAL NERVES: Pupils are equal, round and reactive to light and accomodation; extra ocular movements are full, there is no significant nystagmus; visual fields are full; upper and lower facial muscles are normal in strength and symmetric, there is no flattening of the nasolabial folds; tongue is midline; uvula is midline; shoulder elevation is normal.  MOTOR: Moves both sides well and spontaneously.  There appears to be somewhat increased tone involving the legs.  She does move them to painful stimuli.  She does not follow commands and therefore we are unable to assess strength.  COORDINATION: No tremors are noted.  There is no rigidity, cogwheeling or bradykinesia.  No dysmetria or myoclonus noted.  REFLEXES: Deep tendon reflexes are symmetrical and normal in the upper extremities but brisk in the legs.  Plantar reflexes are flexor bilaterally.   SENSATION: She responds to painful stimuli bilaterally.     Blood pressure (!) 167/86, pulse 94, temperature 98.4 F (36.9 C), resp. rate 20, height 5\' 7"  (1.702 m), weight 77.9 kg, SpO2 98 %.  Past Medical History:  Diagnosis Date  . Allergy   . Diabetes mellitus without complication ([place])   . Hypertension     Past Surgical History:  Procedure Laterality Date  . FRACTURE SURGERY    . KNEE SURGERY Right   . NECK SURGERY    .  SHOULDER SURGERY Left     History reviewed. No pertinent family history.  Social History:  reports that she has been smoking cigarettes. She has never used smokeless tobacco. She reports that she does not drink alcohol or use drugs.  Allergies:  Allergies  Allergen Reactions  . Latex Rash    Medications: Prior to Admission medications   Medication Sig Start Date End Date Taking? Authorizing Provider  metFORMIN (GLUCOPHAGE) 500 MG tablet Take 500 mg by mouth daily.  09/30/18 09/30/19 Yes [provider]  sulfamethoxazole-trimethoprim (BACTRIM DS) 800-160 MG tablet Take 1 tablet by mouth 2 (two) times daily. 5 day course prescribed on 11/08/2018   Yes [provider]    Scheduled Meds: . aspirin EC  325 mg Oral Daily  . atorvastatin  40 mg Oral q1800  . Chlorhexidine Gluconate Cloth  6 each Topical Q0600  . enoxaparin (LOVENOX) injection  40 mg Subcutaneous Q24H  . insulin aspart  0-5 Units Subcutaneous QHS  . insulin aspart  0-9 Units Subcutaneous TID WC   Continuous Infusions: . sodium chloride     PRN Meds:.sodium chloride     Results for orders placed or performed during the hospital encounter of 11/17/18 (from the past 48 hour(s))  Comprehensive metabolic panel     Status: Abnormal   Collection Time: 11/17/18  3:29 PM  Result Value Ref Range   Sodium 134 (L) 135 - 145 mmol/L   Potassium 3.8 3.5 - 5.1 mmol/L   Chloride 100 98 - 111 mmol/L   CO2 21 (L) 22 - 32 mmol/L   Glucose, Bld 306 (H) 70 - 99 mg/dL   BUN 14 6 - 20 mg/dL   Creatinine, Ser 0.63 0.44 - 1.00 mg/dL   Calcium 8.9 8.9 - 10.3 mg/dL   Total Protein 7.0 6.5 - 8.1 g/dL   Albumin 3.6 3.5 - 5.0 g/dL   AST 56 (H) 15 - 41 U/L   ALT 52 (H) 0 - 44 U/L   Alkaline Phosphatase 73 38 - 126 U/L   Total Bilirubin 0.8 0.3 - 1.2 mg/dL   GFR calc non Af Amer >60 >60 mL/min   GFR calc Af Amer >60 >60 mL/min   Anion gap 13 5 - 15    Comment: Performed at [facility], [address], [place],  [place] [address]  CBC with Differential     Status: Abnormal   Collection Time: 11/17/18  3:29 PM  Result Value Ref Range   WBC 15.9 (H) 4.0 - 10.5 K/uL   RBC 5.29 (H) 3.87 - 5.11 MIL/uL   Hemoglobin 15.3 (H) 12.0 - 15.0 g/dL   HCT 44.5 36.0 - 46.0 %   MCV 84.1 80.0 - 100.0 fL   MCH 28.9 26.0 - 34.0 pg   MCHC 34.4 30.0 - 36.0 g/dL   RDW 13.6 11.5 - 15.5 %   Platelets 384 150 - 400 K/uL   nRBC 0.0  0.0 - 0.2 %   Neutrophils Relative % 87 %   Neutro Abs 13.8 (H) 1.7 - 7.7 K/uL   Lymphocytes Relative 7 %   Lymphs Abs 1.1 0.7 - 4.0 K/uL   Monocytes Relative 5 %   Monocytes Absolute 0.8 0.1 - 1.0 K/uL   Eosinophils Relative 0 %   Eosinophils Absolute 0.0 0.0 - 0.5 K/uL   Basophils Relative 0 %   Basophils Absolute 0.1 0.0 - 0.1 K/uL   Immature Granulocytes 1 %   Abs Immature Granulocytes 0.13 (H) 0.00 - 0.07 K/uL    Comment: Performed at [facility], [address] [place], [place] [address]  Ethanol     Status: None   Collection Time: 11/17/18  3:29 PM  Result Value Ref Range   Alcohol, Ethyl (B) <10 <10 mg/dL    Comment: (NOTE) Lowest detectable limit for serum alcohol is 10 mg/dL. For medical purposes only. Performed at [facility], [address] [place], [place] [address]   Salicylate level     Status: None   Collection Time: 11/17/18  3:29 PM  Result Value Ref Range   Salicylate Lvl <7.0 2.8 - 30.0 mg/dL    Comment: Performed at [facility], [address], [place], [place] [address]  Acetaminophen level     Status: Abnormal   Collection Time: 11/17/18  3:29 PM  Result Value Ref Range   Acetaminophen (Tylenol), Serum <10 (L) 10 - 30 ug/mL    Comment: (NOTE) Therapeutic concentrations vary significantly. A range of 10-30 ug/mL  may be an effective concentration for many patients. However, some  are best treated at concentrations outside of this range. Acetaminophen concentrations >150 ug/mL at 4 hours after ingestion  and >50 ug/mL at 12 hours after ingestion are often  associated with  toxic reactions. Performed at [facility], [address] [place], [place] [address]   Lactic acid, plasma     Status: Abnormal   Collection Time: 11/17/18  3:29 PM  Result Value Ref Range   Lactic Acid, Venous 3.3 (HH) 0.5 - 1.9 mmol/L    Comment: CRITICAL RESULT CALLED TO, READ BACK BY AND VERIFIED WITH: WILEY,E ON 11/17/18 AT 1620 BY LOY,C Performed at [facility], [address], [place], [place] [address]   Magnesium     Status: None   Collection Time: 11/17/18  3:29 PM  Result Value Ref Range   Magnesium 2.1 1.7 - 2.4 mg/dL    Comment: Performed at [facility], [address] [place], [place] [address]  Phosphorus     Status: None   Collection Time: 11/17/18  3:29 PM  Result Value Ref Range   Phosphorus 4.2 2.5 - 4.6 mg/dL    Comment: Performed at [facility], [address] [place], [place] [address]  Hemoglobin A1c     Status: Abnormal   Collection Time: 11/17/18  3:29 PM  Result Value Ref Range   Hgb A1c MFr Bld 6.7 (H) 4.8 - 5.6 %    Comment: (NOTE) Pre diabetes:          5.7%-6.4% Diabetes:              >6.4% Glycemic control for   <7.0% adults with diabetes    Mean Plasma Glucose 145.59 mg/dL    Comment: Performed at [facility], [address] [place], [place] [address]  Troponin I (High Sensitivity)     Status: None   Collection Time: 11/17/18  3:33 PM  Result  Value Ref Range   Troponin I (High Sensitivity) 8.00 <18 ng/L    Comment: (NOTE) Elevated high sensitivity troponin I (hsTnI) values and significant  changes across serial measurements may suggest ACS but many other  chronic and acute conditions are known to elevate hsTnI results.  Refer to the "Links" section for chest pain algorithms and additional  guidance. Performed at [facility], [address] [place], [place] [address]   Urinalysis, Routine w reflex microscopic     Status: Abnormal   Collection Time: 11/17/18  5:47 PM  Result Value Ref Range   Color, Urine  YELLOW YELLOW   APPearance CLEAR CLEAR   Specific Gravity, Urine 1.021 1.005 - 1.030   pH 5.0 5.0 - 8.0   Glucose, UA >=500 (A) NEGATIVE mg/dL   Hgb urine dipstick NEGATIVE NEGATIVE   Bilirubin Urine NEGATIVE NEGATIVE   Ketones, ur NEGATIVE NEGATIVE mg/dL   Protein, ur NEGATIVE NEGATIVE mg/dL   Nitrite NEGATIVE NEGATIVE   Leukocytes,Ua NEGATIVE NEGATIVE   RBC / HPF 0-5 0 - 5 RBC/hpf   WBC, UA 0-5 0 - 5 WBC/hpf   Bacteria, UA NONE SEEN NONE SEEN   Squamous Epithelial / LPF 0-5 0 - 5   Mucus PRESENT    Ca Oxalate Crys, UA PRESENT     Comment: Performed at [facility], [address] [place], [place] [address]  Urine rapid drug screen (hosp performed)     Status: None   Collection Time: 11/17/18  5:47 PM  Result Value Ref Range   Opiates NONE DETECTED NONE DETECTED   Cocaine NONE DETECTED NONE DETECTED   Benzodiazepines NONE DETECTED NONE DETECTED   Amphetamines NONE DETECTED NONE DETECTED   Tetrahydrocannabinol NONE DETECTED NONE DETECTED   Barbiturates NONE DETECTED NONE DETECTED    Comment: (NOTE) DRUG SCREEN FOR MEDICAL PURPOSES ONLY.  IF CONFIRMATION IS NEEDED FOR ANY PURPOSE, NOTIFY LAB WITHIN 5 DAYS. LOWEST DETECTABLE LIMITS FOR URINE DRUG SCREEN Drug Class                     Cutoff (ng/mL) Amphetamine and metabolites    1000 Barbiturate and metabolites    200 Benzodiazepine                 200 Tricyclics and metabolites     300 Opiates and metabolites        300 Cocaine and metabolites        300 THC                            50 Performed at [facility], [address] [place], [place] [address]   Troponin I (High Sensitivity)     Status: None   Collection Time: 11/17/18  5:52 PM  Result Value Ref Range   Troponin I (High Sensitivity) 10.00 <18 ng/L    Comment: (NOTE) Elevated high sensitivity troponin I (hsTnI) values and significant  changes across serial measurements may suggest ACS but many other  chronic and acute conditions are known to elevate hsTnI  results.  Refer to the "Links" section for chest pain algorithms and additional  guidance. Performed at [facility], [address], [place], [place] [address]   Ammonia     Status: None   Collection Time: 11/17/18  5:52 PM  Result Value Ref Range   Ammonia 17 9 - 35 umol/L    Comment: Performed at [facility], [address]  [place], [place] [address]  Protime-INR     Status: None   Collection Time: 11/17/18  8:00 PM  Result Value Ref Range   Prothrombin Time 14.0 11.4 - 15.2 seconds   INR 1.1 0.8 - 1.2    Comment: (NOTE) INR goal varies based on device and disease states. Performed at [facility], [address], [place], [place] [address]   APTT     Status: None   Collection Time: 11/17/18  8:00 PM  Result Value Ref Range   aPTT 29 24 - 36 seconds    Comment: Performed at [facility], [address], [place], [place] [address]  SARS Coronavirus 2 (CEPHEID - Performed in [facility] hospital lab), Hosp Order     Status: None   Collection Time: 11/17/18  8:40 PM   Specimen: Nasopharyngeal Swab  Result Value Ref Range   SARS Coronavirus 2 NEGATIVE NEGATIVE    Comment: (NOTE) If result is NEGATIVE SARS-CoV-2 target nucleic acids are NOT DETECTED. The SARS-CoV-2 RNA is generally detectable in upper and lower  respiratory specimens during the acute phase of infection. The lowest  concentration of SARS-CoV-2 viral copies this assay can detect is 250  copies / mL. A negative result does not preclude SARS-CoV-2 infection  and should not be used as the sole basis for treatment or other  patient management decisions.  A negative result may occur with  improper specimen collection / handling, submission of specimen other  than nasopharyngeal swab, presence of viral mutation(s) within the  areas targeted by this assay, and inadequate number of viral copies  (<250 copies / mL). A negative result must be combined with clinical  observations, patient history, and epidemiological  information. If result is POSITIVE SARS-CoV-2 target nucleic acids are DETECTED. The SARS-CoV-2 RNA is generally detectable in upper and lower  respiratory specimens dur ing the acute phase of infection.  Positive  results are indicative of active infection with SARS-CoV-2.  Clinical  correlation with patient history and other diagnostic information is  necessary to determine patient infection status.  Positive results do  not rule out bacterial infection or co-infection with other viruses. If result is PRESUMPTIVE POSTIVE SARS-CoV-2 nucleic acids MAY BE PRESENT.   A presumptive positive result was obtained on the submitted specimen  and confirmed on repeat testing.  While 2019 novel coronavirus  (SARS-CoV-2) nucleic acids may be present in the submitted sample  additional confirmatory testing may be necessary for epidemiological  and / or clinical management purposes  to differentiate between  SARS-CoV-2 and other Sarbecovirus currently known to infect humans.  If clinically indicated additional testing with an alternate test  methodology [id number]) is advised. The SARS-CoV-2 RNA is generally  detectable in upper and lower respiratory sp ecimens during the acute  phase of infection. The expected result is Negative. Fact Sheet for Patients:  BoilerBrush.com.cy Fact Sheet for Healthcare Providers: https://pope.com/ This test is not yet approved or cleared by the [place] FDA and has been authorized for detection and/or diagnosis of SARS-CoV-2 by FDA under an Emergency Use Authorization (EUA).  This EUA will remain in effect (meaning this test can be used) for the duration of the COVID-19 declaration under Section 564(b)(1) of the Act, 21 U.S.C. section 360bbb-3(b)(1), unless the authorization is terminated or revoked sooner. Performed at [facility], [address] [place], [place] [address]   Lactic acid, plasma     Status:  Abnormal   Collection Time: 11/17/18  8:57 PM  Result Value  Ref Range   Lactic Acid, Venous 3.4 (HH) 0.5 - 1.9 mmol/L    Comment: CRITICAL RESULT CALLED TO, READ BACK BY AND VERIFIED WITH: AMBURN,A ON 11/17/18 AT 2240 BY LOY,C Performed at [facility], [address] [place], [place] [address]   Culture, blood (x 2)     Status: None (Preliminary result)   Collection Time: 11/17/18  8:57 PM   Specimen: BLOOD  Result Value Ref Range   Specimen Description BLOOD LEFT ANTECUBITAL    Special Requests      BOTTLES DRAWN AEROBIC AND ANAEROBIC Blood Culture adequate volume   Culture      NO GROWTH < 12 HOURS Performed at [facility], [address] [place], [place] [address]    Report Status PENDING   Culture, blood (x 2)     Status: None (Preliminary result)   Collection Time: 11/17/18  8:59 PM   Specimen: BLOOD  Result Value Ref Range   Specimen Description      BLOOD BLOOD RIGHT WRIST Performed at [facility], [address] [place], [place] [address]    Special Requests      BOTTLES DRAWN AEROBIC AND ANAEROBIC Blood Culture adequate volume Performed at [facility], [address] [place], [place] [address]    Culture  Setup Time      GRAM POSITIVE COCCI ANAEROBIC BOTTLE Gram Stain Report Called to,Read Back By and Verified With: BENGSTON P. AT 1402 BY THOMPSON S. Organism ID to follow Performed at [facility], [address] [place], [place] [address]    Culture PENDING    Report Status PENDING   TSH     Status: None   Collection Time: 11/17/18  8:59 PM  Result Value Ref Range   TSH 0.427 0.350 - 4.500 uIU/mL    Comment: Performed by a 3rd Generation assay with a functional sensitivity of <=0.01 uIU/mL. Performed at [facility], [address] [place], [place] [address]   Vitamin B12     Status: None   Collection Time: 11/17/18  8:59 PM  Result Value Ref Range   Vitamin B-12 315 180 - 914 pg/mL    Comment: (NOTE) This assay is not validated for testing  neonatal or myeloproliferative syndrome specimens for Vitamin B12 levels. Performed at [facility], [address] [place], [place] [address]   CBG monitoring, ED     Status: Abnormal   Collection Time: 11/17/18  9:23 PM  Result Value Ref Range   Glucose-Capillary 138 (H) 70 - 99 mg/dL  MRSA PCR Screening     Status: None   Collection Time: 11/17/18 10:22 PM   Specimen: Nasal Mucosa; Nasopharyngeal  Result Value Ref Range   MRSA by PCR NEGATIVE NEGATIVE    Comment:        The GeneXpert MRSA Assay (FDA approved for NASAL specimens only), is one component of a comprehensive MRSA colonization surveillance program. It is not intended to diagnose MRSA infection nor to guide or monitor treatment for MRSA infections. Performed at [facility], [address] [place], [place] [address]   Lactic acid, plasma     Status: None   Collection Time: 11/18/18  2:25 AM  Result Value Ref Range   Lactic Acid, Venous 1.0 0.5 - 1.9 mmol/L    Comment: Performed at [facility], [address] [place], [place] [address]  CBC with Differential     Status: None   Collection Time: 11/18/18  8:00 AM  Result Value Ref  Range   WBC 8.5 4.0 - 10.5 K/uL   RBC 4.89 3.87 - 5.11 MIL/uL   Hemoglobin 13.8 12.0 - 15.0 g/dL   HCT 65.742.3 84.636.0 - 96.246.0 %   MCV 86.5 80.0 - 100.0 fL   MCH 28.2 26.0 - 34.0 pg   MCHC 32.6 30.0 - 36.0 g/dL   RDW 95.213.8 84.111.5 - 32.415.5 %   Platelets 341 150 - 400 K/uL   nRBC 0.0 0.0 - 0.2 %   Neutrophils Relative % 72 %   Neutro Abs 6.1 1.7 - 7.7 K/uL   Lymphocytes Relative 19 %   Lymphs Abs 1.6 0.7 - 4.0 K/uL   Monocytes Relative 7 %   Monocytes Absolute 0.6 0.1 - 1.0 K/uL   Eosinophils Relative 1 %   Eosinophils Absolute 0.1 0.0 - 0.5 K/uL   Basophils Relative 0 %   Basophils Absolute 0.0 0.0 - 0.1 K/uL   Immature Granulocytes 1 %   Abs Immature Granulocytes 0.06 0.00 - 0.07 K/uL    Comment: Performed at [facility], [address] [place], [place] [address]  Comprehensive  metabolic panel     Status: Abnormal   Collection Time: 11/18/18  8:00 AM  Result Value Ref Range   Sodium 139 135 - 145 mmol/L   Potassium 3.9 3.5 - 5.1 mmol/L   Chloride 108 98 - 111 mmol/L   CO2 21 (L) 22 - 32 mmol/L   Glucose, Bld 169 (H) 70 - 99 mg/dL   BUN 7 6 - 20 mg/dL   Creatinine, Ser 7.250.51 0.44 - 1.00 mg/dL   Calcium 8.4 (L) 8.9 - 10.3 mg/dL   Total Protein 7.0 6.5 - 8.1 g/dL   Albumin 3.5 3.5 - 5.0 g/dL   AST 40 15 - 41 U/L   ALT 42 0 - 44 U/L   Alkaline Phosphatase 66 38 - 126 U/L   Total Bilirubin 0.5 0.3 - 1.2 mg/dL   GFR calc non Af Amer >60 >60 mL/min   GFR calc Af Amer >60 >60 mL/min   Anion gap 10 5 - 15    Comment: Performed at [facility], [address] [place], [place] [address]  Lactic acid, plasma     Status: None   Collection Time: 11/18/18  8:00 AM  Result Value Ref Range   Lactic Acid, Venous 1.6 0.5 - 1.9 mmol/L    Comment: Performed at [facility], [address] [place], [place] [address]  Procalcitonin - Baseline     Status: None   Collection Time: 11/18/18  8:00 AM  Result Value Ref Range   Procalcitonin <0.10 ng/mL    Comment:        Interpretation: PCT (Procalcitonin) <= 0.5 ng/mL: Systemic infection (sepsis) is not likely. Local bacterial infection is possible. (NOTE)       Sepsis PCT Algorithm           Lower Respiratory Tract                                      Infection PCT Algorithm    ----------------------------     ----------------------------         PCT < 0.25 ng/mL                PCT < 0.10 ng/mL         Strongly encourage  Strongly discourage   discontinuation of antibiotics    initiation of antibiotics    ----------------------------     -----------------------------       PCT 0.25 - 0.50 ng/mL            PCT 0.10 - 0.25 ng/mL               OR       >80% decrease in PCT            Discourage initiation of                                            antibiotics      Encourage discontinuation           of  antibiotics    ----------------------------     -----------------------------         PCT >= 0.50 ng/mL              PCT 0.26 - 0.50 ng/mL               AND        <80% decrease in PCT             Encourage initiation of                                             antibiotics       Encourage continuation           of antibiotics    ----------------------------     -----------------------------        PCT >= 0.50 ng/mL                  PCT > 0.50 ng/mL               AND         increase in PCT                  Strongly encourage                                      initiation of antibiotics    Strongly encourage escalation           of antibiotics                                     -----------------------------                                           PCT <= 0.25 ng/mL                                                 OR                                        >  80% decrease in PCT                                     Discontinue / Do not initiate                                             antibiotics Performed at [facility], [address], [place], [place] [address]   Lipid panel     Status: Abnormal   Collection Time: 11/18/18  8:00 AM  Result Value Ref Range   Cholesterol 184 0 - 200 mg/dL   Triglycerides 191 (H) <150 mg/dL   HDL 34 (L) >47 mg/dL   Total CHOL/HDL Ratio 5.4 RATIO   VLDL 39 0 - 40 mg/dL   LDL Cholesterol 829 (H) 0 - 99 mg/dL    Comment:        Total Cholesterol/HDL:CHD Risk Coronary Heart Disease Risk Table                     Men   Women  1/2 Average Risk   3.4   3.3  Average Risk       5.0   4.4  2 X Average Risk   9.6   7.1  3 X Average Risk  23.4   11.0        Use the calculated Patient Ratio above and the CHD Risk Table to determine the patient's CHD Risk.        ATP III CLASSIFICATION (LDL):  <100     mg/dL   Optimal  562-130  mg/dL   Near or Above                    Optimal  130-159  mg/dL   Borderline  865-784  mg/dL   High  >696     mg/dL    Very High Performed at [facility], [address] [place], [place] [address]   Glucose, capillary     Status: Abnormal   Collection Time: 11/18/18  8:06 AM  Result Value Ref Range   Glucose-Capillary 162 (H) 70 - 99 mg/dL  Glucose, capillary     Status: Abnormal   Collection Time: 11/18/18 11:22 AM  Result Value Ref Range   Glucose-Capillary 186 (H) 70 - 99 mg/dL  Glucose, capillary     Status: Abnormal   Collection Time: 11/18/18  4:21 PM  Result Value Ref Range   Glucose-Capillary 148 (H) 70 - 99 mg/dL    Studies/Results:   BRAIN MRI MRA FINDINGS: MRI HEAD FINDINGS  Brain: The study suffers from considerable motion degradation. Diffusion imaging suggests some acute or subacute deep white matter infarctions in both cerebral hemispheres in the parietal regions as might be seen with hypoperfusion/watershed infarction. There may be some T2 shine through effect, but I think at least 1 focus in the right parietal region convincingly relates to acute white matter infarction. No focal insult affects the brainstem or cerebellum. Cerebral hemispheres elsewhere show pronounced atrophy and gliosis of both frontal lobes and temporal tips suggesting previous close head injury. Chronic small-vessel ischemic changes affect the cerebral hemispheric white matter elsewhere. No evidence of obstructive hydrocephalus or extra-axial collection.  Vascular: Major vessels at the base of the brain show flow.  Skull and upper  cervical spine: Negative  Sinuses/Orbits: Clear/normal  Other: None  MRA HEAD FINDINGS  Both internal carotid arteries are patent through the skull base and siphon regions. There is artifactual signal loss in the circle-of-Willis region due to motion visible on the source images. I believe the anterior and middle cerebral vessels are widely patent. Both vertebral arteries are patent to the basilar. No basilar stenosis. As above, posterior circulation branch  vessels appear patent but are poorly seen because of motion artifact.  IMPRESSION: Scattered foci restricted diffusion in the parietal white matter on both sides consistent with acute deep white matter infarctions. Bilateral nature suggests that this could be due to hypoperfusion or watershed insults. Some of the appearance may relate to T2 shine through, but at least 1 of the foci in the right parietal region show definite low signal on the ADC map.  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  Background pattern of chronic small vessel ischemic changes of the white matter as seen previously.  Motion degraded MR angiography. I do not think there is any large or medium vessel occlusion or true proximal stenosis. Signal loss in the circle-of-Willis region relates to motion on the source images.      CAROTID DOP IMPRESSION: 1. Mild (1-49%) stenosis proximal right internal carotid artery secondary to mild heterogeneous atherosclerotic plaque. 2. Mild (1-49%) stenosis proximal left internal carotid artery secondary to mild heterogeneous atherosclerotic plaque. 3. Vertebral arteries are patent with normal antegrade flow.  TTE   1. The left ventricle has normal systolic function with an ejection fraction of 60-65%. The cavity size was normal. There is mildly increased left ventricular wall thickness. Left ventricular diastolic parameters were normal.  2. The right ventricle has normal systolic function. The cavity was normal. There is no increase in right ventricular wall thickness.  3. Mild thickening of the mitral valve leaflet. Mild calcification of the mitral valve leaflet.  4. The aortic valve is tricuspid. Mild thickening of the aortic valve. Mild calcification of the aortic valve. Aortic valve regurgitation is trivial by color flow Doppler.  5. The aortic root is normal in size and structure.      Th MRI scan is  reviewed and shows rather severe global atrophy.  The findings are more profound involving the frontotemporal regions bilaterally.  There are several areas of reduced signal seen on T1 indicating remote infarcts.  These are involving the centrum semiovale some of the perpendicular regions of the ventricular system.  The largest one involves the right parietal region.  There are a few increased signal seen on DWI involving the centrum semiovale bilaterally especially in the parietal regions suggestive of small bilateral infarcts.  MRA is somewhat degraded but there seems to be reduced signal involving the M1 segment of the MCA on the left side.   Gabrien Mentink A. Gerilyn Pilgrim, M.D.  Diplomate, Biomedical engineer of Psychiatry and Neurology ( Neurology). 11/18/2018, 4:32 PM

## 2018-11-19 LAB — CBC
HCT: 40.7 % (ref 36.0–46.0)
Hemoglobin: 13.6 g/dL (ref 12.0–15.0)
MCH: 28.6 pg (ref 26.0–34.0)
MCHC: 33.4 g/dL (ref 30.0–36.0)
MCV: 85.5 fL (ref 80.0–100.0)
Platelets: 348 10*3/uL (ref 150–400)
RBC: 4.76 MIL/uL (ref 3.87–5.11)
RDW: 13.4 % (ref 11.5–15.5)
WBC: 8.8 10*3/uL (ref 4.0–10.5)
nRBC: 0 % (ref 0.0–0.2)

## 2018-11-19 LAB — COMPREHENSIVE METABOLIC PANEL
ALT: 38 U/L (ref 0–44)
AST: 34 U/L (ref 15–41)
Albumin: 3.4 g/dL — ABNORMAL LOW (ref 3.5–5.0)
Alkaline Phosphatase: 64 U/L (ref 38–126)
Anion gap: 10 (ref 5–15)
BUN: 6 mg/dL (ref 6–20)
CO2: 26 mmol/L (ref 22–32)
Calcium: 9 mg/dL (ref 8.9–10.3)
Chloride: 104 mmol/L (ref 98–111)
Creatinine, Ser: 0.43 mg/dL — ABNORMAL LOW (ref 0.44–1.00)
GFR calc Af Amer: >60 mL/min (ref >60)
GFR calc non Af Amer: >60 mL/min (ref >60)
Glucose, Bld: 181 mg/dL — ABNORMAL HIGH (ref 70–99)
Potassium: 3.6 mmol/L (ref 3.5–5.1)
Sodium: 140 mmol/L (ref 135–145)
Total Bilirubin: 0.7 mg/dL (ref 0.3–1.2)
Total Protein: 6.6 g/dL (ref 6.5–8.1)

## 2018-11-19 LAB — GLUCOSE, CAPILLARY
Glucose-Capillary: 150 mg/dL — ABNORMAL HIGH (ref 70–99)
Glucose-Capillary: 171 mg/dL — ABNORMAL HIGH (ref 70–99)
Glucose-Capillary: 229 mg/dL — ABNORMAL HIGH (ref 70–99)
Glucose-Capillary: 132 mg/dL — ABNORMAL HIGH (ref 70–99)

## 2018-11-19 LAB — RPR: RPR Ser Ql: NONREACTIVE

## 2018-11-19 LAB — LACTIC ACID, PLASMA: Lactic Acid, Venous: 0.9 mmol/L (ref 0.5–1.9)

## 2018-11-19 MED ORDER — CYANOCOBALAMIN 1000 MCG/ML IJ SOLN
1000.0000 ug | Freq: Once | INTRAMUSCULAR | Status: AC
  Administered 2018-11-19: 1000 ug via INTRAMUSCULAR
  Filled 2018-11-19: qty 1

## 2018-11-19 MED ORDER — HYDRALAZINE HCL 20 MG/ML IJ SOLN: 10.0000 mg | INTRAMUSCULAR | Status: DC | PRN

## 2018-11-19 NOTE — Progress Notes (Signed)
Called patient's husband from room so patient could talk with husband.

## 2018-11-19 NOTE — Progress Notes (Signed)
PROGRESS NOTE    Monica Ayers  ONG:[id number] DOB: [birth year] DOA: 11/17/2018 PCP: Terald Sleeper, PA-C   Brief Narrative:  Per HPI: Monica Ayers VERNONis a 58 y.o.femalewith medical history significant ofseasonal allergies, type 2 diabetes, hypertension who is coming to the emergency department brought by her husband due to 3 weeks of progressively worse mental status. Apparently the patient has not gotten much out of bed, not eating much and not answering questions properly. She is unable to provide further history. Her husband denies any history of recent head trauma. Fever, vomiting or diarrhea to his knowledge.  Patient was admitted with acute encephalopathy suspected to be metabolic secondary to sepsis and was started on cefepime, vancomycin, and Flagyl.  She was noted to have elevated lactic acidosis as well.  She had remained on some IV fluids.  She has undergone MRI of the brain demonstrating acute, deep white matter CVAs this morning.  She is already noted to have frontal lobe dementia with prior closed head injury.  She has been seen by neurology on 7/17 with cervical MRI performed with no acute findings.  EEG currently pending as well as PT evaluation.   Assessment & Plan:   Principal Problem:   Sepsis due to undetermined organism [facility]) Active Problems:   Essential hypertension   Lactic acidosis   Type 2 diabetes mellitus (HCC)   Frontal lobe dementia (HCC)   Pressure injury of skin   Transaminasemia   Acute encephalopathy secondary to bilateral deep white matter CVA in the setting of frontal lobe dementia -Further evaluation with 2D echocardiogram and carotid ultrasound with no acute abnormalities.  LVEF 60-65% -Hemoglobin A1c noted to be 6.7%, LDL 111 -We will continue on full dose aspirin as well as statin -Appreciate neurology evaluation with EEG pending -Vitamin B12 and TSH within normal limits, will give B12 shot -PT evaluation pending  Lactic  acidosis-improving -Likely secondary to above with no sign of sepsis currently noted -Antibiotics discontinued -No further need for IV fluid -Coag negative MRSA in 1 bottle seem to be contaminant  Type 2 diabetes with mild hyperglycemia -Continue to hold metformin due to lactic acidosis -Maintain on carb modified diet -Started SSI  Essential hypertension -Allow permissive hypertension for now with no medications noted at home -We will order hydralazine for severe elevations  Transaminitis-improved -Improved on repeat labs and will start statin -Recheck levels in a.m.   DVT prophylaxis: Lovenox Code Status: Full Family Communication: We will plan to call husband Disposition Plan:  Continue further evaluation of encephalopathy with EEG.  PT evaluation pending.  Anticipate discharge in a.m. if studies are negative and can go home with home health.   Consultants:   Neurology  Procedures:   Brain MRI 7/17  Antimicrobials:  Anti-infectives (From admission, onward)   Start     Dose/Rate Route Frequency Ordered Stop   11/18/18 1100  vancomycin (VANCOCIN) IVPB 1000 mg/200 mL premix  Status:  Discontinued     1,000 mg 200 mL/hr over 60 Minutes Intravenous Every 12 hours 11/17/18 2126 11/18/18 0942   11/18/18 0600  ceFEPIme (MAXIPIME) 2 g in sodium chloride 0.9 % 100 mL IVPB  Status:  Discontinued     2 g 200 mL/hr over 30 Minutes Intravenous Every 8 hours 11/17/18 2123 11/18/18 1206   11/17/18 2100  ceFEPIme (MAXIPIME) 2 g in sodium chloride 0.9 % 100 mL IVPB     2 g 200 mL/hr over 30 Minutes Intravenous  Once 11/17/18 2009 11/17/18 2335  11/17/18 2100  metroNIDAZOLE (FLAGYL) IVPB 500 mg  Status:  Discontinued     500 mg 100 mL/hr over 60 Minutes Intravenous Every 8 hours 11/17/18 2009 11/18/18 1206   11/17/18 2030  vancomycin (VANCOCIN) IVPB 1000 mg/200 mL premix  Status:  Discontinued     1,000 mg 200 mL/hr over 60 Minutes Intravenous  Once 11/17/18 2009 11/17/18  2015   11/17/18 2015  vancomycin (VANCOCIN) 1,500 mg in sodium chloride 0.9 % 500 mL IVPB     1,500 mg 250 mL/hr over 120 Minutes Intravenous  Once 11/17/18 2015 11/18/18 0111       Subjective: Patient seen and evaluated today with no new acute complaints or concerns. No acute concerns or events noted overnight.  She still appears confused.  Objective: Vitals:   11/18/18 1330 11/18/18 2034 11/18/18 2133 11/19/18 0540  BP: (!) 163/92  (!) 163/112 (!) 175/93  Pulse: 90 90 94 95  Resp: 18 16 18 18   Temp: 98.1 F (36.7 C)  98.8 F (37.1 C) 99 F (37.2 C)  TempSrc: Oral  Oral Oral  SpO2: 97% 94% 99% 97%  Weight:      Height:        Intake/Output Summary (Last 24 hours) at 11/19/2018 1236 Last data filed at 11/19/2018 0900 Gross per 24 hour  Intake 480 ml  Output --  Net 480 ml   Filed Weights   11/17/18 1441 11/17/18 2220 11/18/18 0500  Weight: 83.9 kg 77.9 kg 77.9 kg    Examination:  General exam: Appears calm and comfortable  Respiratory system: Clear to auscultation. Respiratory effort normal. Cardiovascular system: S1 & S2 heard, RRR. No JVD, murmurs, rubs, gallops or clicks. No pedal edema. Gastrointestinal system: Abdomen is nondistended, soft and nontender. No organomegaly or masses felt. Normal bowel sounds heard. Central nervous system: Alert and oriented. No focal neurological deficits. Extremities: Symmetric 5 x 5 power. Skin: No rashes, lesions or ulcers Psychiatry: Judgement and insight appear normal. Mood & affect appropriate.     Data Reviewed: I have personally reviewed following labs and imaging studies  CBC: Recent Labs  Lab 11/17/18 1529 11/18/18 0800 11/19/18 0538  WBC 15.9* 8.5 8.8  NEUTROABS 13.8* 6.1  --   HGB 15.3* 13.8 13.6  HCT 44.5 42.3 40.7  MCV 84.1 86.5 85.5  PLT 384 341 348   Basic Metabolic Panel: Recent Labs  Lab 11/17/18 1529 11/18/18 0800 11/19/18 0538  NA 134* 139 140  K 3.8 3.9 3.6  CL 100 108 104  CO2 21* 21*  26  GLUCOSE 306* 169* 181*  BUN 14 7 6   CREATININE 0.63 0.51 0.43*  CALCIUM 8.9 8.4* 9.0  MG 2.1  --   --   PHOS 4.2  --   --    GFR: Estimated Creatinine Clearance: 82.4 mL/min (A) (by C-G formula based on SCr of 0.43 mg/dL (L)). Liver Function Tests: Recent Labs  Lab 11/17/18 1529 11/18/18 0800 11/19/18 0538  AST 56* 40 34  ALT 52* 42 38  ALKPHOS 73 66 64  BILITOT 0.8 0.5 0.7  PROT 7.0 7.0 6.6  ALBUMIN 3.6 3.5 3.4*   No results for input(s): LIPASE, AMYLASE in the last 168 hours. Recent Labs  Lab 11/17/18 1752  AMMONIA 17   Coagulation Profile: Recent Labs  Lab 11/17/18 2000  INR 1.1   Cardiac Enzymes: No results for input(s): CKTOTAL, CKMB, CKMBINDEX, TROPONINI in the last 168 hours. BNP (last 3 results) No results for input(s): PROBNP in  the last 8760 hours. HbA1C: Recent Labs    11/17/18 1529  HGBA1C 6.7*   CBG: Recent Labs  Lab 11/18/18 1122 11/18/18 1621 11/18/18 2217 11/19/18 0714 11/19/18 1107  GLUCAP 186* 148* 146* 150* 171*   Lipid Profile: Recent Labs    11/18/18 0800  CHOL 184  HDL 34*  LDLCALC 111*  TRIG 195*  CHOLHDL 5.4   Thyroid Function Tests: Recent Labs    11/17/18 2059  TSH 0.427   Anemia Panel: Recent Labs    11/17/18 2059  VITAMINB12 315   Sepsis Labs: Recent Labs  Lab 11/17/18 2057 11/18/18 0225 11/18/18 0800 11/19/18 0538  PROCALCITON  --   --  <0.10  --   LATICACIDVEN 3.4* 1.0 1.6 0.9    Recent Results (from the past 240 hour(s))  SARS Coronavirus 2 (CEPHEID - Performed in [facility] hospital lab), Hosp Order     Status: None   Collection Time: 11/17/18  8:40 PM   Specimen: Nasopharyngeal Swab  Result Value Ref Range Status   SARS Coronavirus 2 NEGATIVE NEGATIVE Final    Comment: (NOTE) If result is NEGATIVE SARS-CoV-2 target nucleic acids are NOT DETECTED. The SARS-CoV-2 RNA is generally detectable in upper and lower  respiratory specimens during the acute phase of infection. The lowest   concentration of SARS-CoV-2 viral copies this assay can detect is 250  copies / mL. A negative result does not preclude SARS-CoV-2 infection  and should not be used as the sole basis for treatment or other  patient management decisions.  A negative result may occur with  improper specimen collection / handling, submission of specimen other  than nasopharyngeal swab, presence of viral mutation(s) within the  areas targeted by this assay, and inadequate number of viral copies  (<250 copies / mL). A negative result must be combined with clinical  observations, patient history, and epidemiological information. If result is POSITIVE SARS-CoV-2 target nucleic acids are DETECTED. The SARS-CoV-2 RNA is generally detectable in upper and lower  respiratory specimens dur ing the acute phase of infection.  Positive  results are indicative of active infection with SARS-CoV-2.  Clinical  correlation with patient history and other diagnostic information is  necessary to determine patient infection status.  Positive results do  not rule out bacterial infection or co-infection with other viruses. If result is PRESUMPTIVE POSTIVE SARS-CoV-2 nucleic acids MAY BE PRESENT.   A presumptive positive result was obtained on the submitted specimen  and confirmed on repeat testing.  While 2019 novel coronavirus  (SARS-CoV-2) nucleic acids may be present in the submitted sample  additional confirmatory testing may be necessary for epidemiological  and / or clinical management purposes  to differentiate between  SARS-CoV-2 and other Sarbecovirus currently known to infect humans.  If clinically indicated additional testing with an alternate test  methodology [phone]([id number]) is advised. The SARS-CoV-2 RNA is generally  detectable in upper and lower respiratory sp ecimens during the acute  phase of infection. The expected result is Negative. Fact Sheet for Patients:  BoilerBrush.com.cyhttps://www.fda.gov/media/136312/download Fact Sheet  for Healthcare Providers: https://pope.com/https://www.fda.gov/media/136313/download This test is not yet approved or cleared by the [place] FDA and has been authorized for detection and/or diagnosis of SARS-CoV-2 by FDA under an Emergency Use Authorization (EUA).  This EUA will remain in effect (meaning this test can be used) for the duration of the COVID-19 declaration under Section 564(b)(1) of the Act, 21 U.S.C. section 360bbb-3(b)(1), unless the authorization is terminated or revoked sooner. Performed at [facility], [address], [place], [place] [address]   Culture, blood (x 2)     Status: None (Preliminary result)   Collection Time: 11/17/18  8:57 PM   Specimen: BLOOD  Result Value Ref Range Status   Specimen Description BLOOD LEFT ANTECUBITAL  Final   Special Requests   Final    BOTTLES DRAWN AEROBIC AND ANAEROBIC Blood Culture adequate volume   Culture   Final    NO GROWTH 2 DAYS Performed at [facility], [address], [place], [place] [address]    Report Status PENDING  Incomplete  Culture, blood (x 2)     Status: None (Preliminary result)   Collection Time: 11/17/18  8:59 PM   Specimen: BLOOD  Result Value Ref Range Status   Specimen Description   Final    BLOOD BLOOD RIGHT WRIST Performed at [facility], [address], [place], [place] [address]    Special Requests   Final    BOTTLES DRAWN AEROBIC AND ANAEROBIC Blood Culture adequate volume Performed at [facility], [address], [place], [place] [address]    Culture  Setup Time   Final    GRAM POSITIVE COCCI ANAEROBIC BOTTLE ONLY Gram Stain Report Called to,Read Back By and Verified With: BENGSTON P. AT 1402 BY THOMPSON S. CRITICAL RESULT CALLED TO, READ BACK BY AND VERIFIED WITH: Jean Rosenthal RN 1747 11/18/18 A BROWNING    Culture   Final    GRAM POSITIVE COCCI CULTURE REINCUBATED FOR BETTER GROWTH Performed at [facility], [address] [place], [place] [address]    Report Status PENDING  Incomplete    Blood Culture ID Panel (Reflexed)     Status: Abnormal   Collection Time: 11/17/18  8:59 PM  Result Value Ref Range Status   Enterococcus species NOT DETECTED NOT DETECTED Final   Listeria monocytogenes NOT DETECTED NOT DETECTED Final   Staphylococcus species DETECTED (A) NOT DETECTED Final    Comment: Methicillin (oxacillin) resistant coagulase negative staphylococcus. Possible blood culture contaminant (unless isolated from more than one blood culture draw or clinical case suggests pathogenicity). No antibiotic treatment is indicated for blood  culture contaminants. CRITICAL RESULT CALLED TO, READ BACK BY AND VERIFIED WITH: Jean Rosenthal RN 1747 11/18/18 A BROWNING    Staphylococcus aureus (BCID) NOT DETECTED NOT DETECTED Final   Methicillin resistance DETECTED (A) NOT DETECTED Final    Comment: CRITICAL RESULT CALLED TO, READ BACK BY AND VERIFIED WITH: P BENGTSON RN 1747 11/18/18 A BROWNING    Streptococcus species NOT DETECTED NOT DETECTED Final   Streptococcus agalactiae NOT DETECTED NOT DETECTED Final   Streptococcus pneumoniae NOT DETECTED NOT DETECTED Final   Streptococcus pyogenes NOT DETECTED NOT DETECTED Final   Acinetobacter baumannii NOT DETECTED NOT DETECTED Final   Enterobacteriaceae species NOT DETECTED NOT DETECTED Final   Enterobacter cloacae complex NOT DETECTED NOT DETECTED Final   Escherichia coli NOT DETECTED NOT DETECTED Final   Klebsiella oxytoca NOT DETECTED NOT DETECTED Final   Klebsiella pneumoniae NOT DETECTED NOT DETECTED Final   Proteus species NOT DETECTED NOT DETECTED Final   Serratia marcescens NOT DETECTED NOT DETECTED Final   Haemophilus influenzae NOT DETECTED NOT DETECTED Final   Neisseria meningitidis NOT DETECTED NOT DETECTED Final   Pseudomonas aeruginosa NOT DETECTED NOT DETECTED Final   Candida albicans NOT DETECTED NOT DETECTED Final   Candida glabrata NOT DETECTED NOT DETECTED Final   Candida krusei NOT DETECTED NOT DETECTED Final   Candida  parapsilosis NOT  DETECTED NOT DETECTED Final   Candida tropicalis NOT DETECTED NOT DETECTED Final    Comment: Performed at [facility], [address] [place], [place] [address]  MRSA PCR Screening     Status: None   Collection Time: 11/17/18 10:22 PM   Specimen: Nasal Mucosa; Nasopharyngeal  Result Value Ref Range Status   MRSA by PCR NEGATIVE NEGATIVE Final    Comment:        The GeneXpert MRSA Assay (FDA approved for NASAL specimens only), is one component of a comprehensive MRSA colonization surveillance program. It is not intended to diagnose MRSA infection nor to guide or monitor treatment for MRSA infections. Performed at [facility], [address] [place], [place] [address]          Radiology Studies: Ct Head Wo Contrast  Result Date: 11/17/2018 CLINICAL DATA:  Altered mental status. EXAM: CT HEAD WITHOUT CONTRAST TECHNIQUE: Contiguous axial images were obtained from the base of the skull through the vertex without intravenous contrast. COMPARISON:  11/05/2018 FINDINGS: Brain: There is no evidence of acute infarct, intracranial hemorrhage, mass, midline shift, or extra-axial fluid collection. Advanced bilateral frontal and temporal lobe volume loss is again seen. Patchy cerebral white matter hypodensities are unchanged and nonspecific but compatible with moderate chronic small vessel ischemic disease. 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 Vascular: Calcified atherosclerosis at the skull base. No hyperdense vessel. Skull: No fracture or focal osseous lesion. Sinuses/Orbits: Visualized paranasal sinuses and mastoid air cells are clear. Visualized orbits are unremarkable. Other: None. IMPRESSION: 1. No evidence of acute intracranial abnormality. 2. Advanced frontotemporal volume loss. 3. Moderate chronic small vessel ischemic disease. Electronically Signed   By: Sebastian Ache M.D.   On: 11/17/2018 19:05   Mr Angio Head Wo  Contrast  Result Date: 11/18/2018 CLINICAL DATA:  Acute presentation yesterday with altered mental status. EXAM: MRI HEAD WITHOUT CONTRAST MRA HEAD WITHOUT CONTRAST TECHNIQUE: Multiplanar, multiecho pulse sequences of the brain and surrounding structures were obtained without intravenous contrast. Angiographic images of the head were obtained using MRA technique without contrast. COMPARISON:  Head CT 11/17/2018 FINDINGS: MRI HEAD FINDINGS Brain: The study suffers from considerable motion degradation. Diffusion imaging suggests some acute or subacute deep white matter infarctions in both cerebral hemispheres in the parietal regions as might be seen with hypoperfusion/watershed infarction. There may be some T2 shine through effect, but I think at least 1 focus in the right parietal region convincingly relates to acute white matter infarction. No focal insult affects the brainstem or cerebellum. Cerebral hemispheres elsewhere show pronounced atrophy and gliosis of both frontal lobes and temporal tips suggesting previous close head injury. Chronic small-vessel ischemic changes affect the cerebral hemispheric white matter elsewhere. No evidence of obstructive hydrocephalus or extra-axial collection. Vascular: Major vessels at the base of the brain show flow. Skull and upper cervical spine: Negative Sinuses/Orbits: Clear/normal Other: None MRA HEAD FINDINGS Both internal carotid arteries are patent through the skull base and siphon regions. There is artifactual signal loss in the circle-of-Willis region due to motion visible on the source images. I believe the anterior and middle cerebral vessels are widely patent. Both vertebral arteries are patent to the basilar. No basilar stenosis. As above, posterior circulation branch vessels appear patent but are poorly seen because of motion artifact. IMPRESSION: Scattered foci restricted diffusion in the parietal white matter on both sides consistent with acute deep white  matter infarctions. Bilateral nature suggests that this could be due to  hypoperfusion or watershed insults. Some of the appearance may relate to T2 shine through, but at least 1 of the foci in the right parietal region show definite low signal on the ADC map. 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 Background pattern of chronic small vessel ischemic changes of the white matter as seen previously. Motion degraded MR angiography. I do not think there is any large or medium vessel occlusion or true proximal stenosis. Signal loss in the circle-of-Willis region relates to motion on the source images. Electronically Signed   By: Paulina FusiMark  Shogry M.D.   On: 11/18/2018 10:28   Mr Brain Wo Contrast  Result Date: 11/18/2018 CLINICAL DATA:  Acute presentation yesterday with altered mental status. EXAM: MRI HEAD WITHOUT CONTRAST MRA HEAD WITHOUT CONTRAST TECHNIQUE: Multiplanar, multiecho pulse sequences of the brain and surrounding structures were obtained without intravenous contrast. Angiographic images of the head were obtained using MRA technique without contrast. COMPARISON:  Head CT 11/17/2018 FINDINGS: MRI HEAD FINDINGS Brain: The study suffers from considerable motion degradation. Diffusion imaging suggests some acute or subacute deep white matter infarctions in both cerebral hemispheres in the parietal regions as might be seen with hypoperfusion/watershed infarction. There may be some T2 shine through effect, but I think at least 1 focus in the right parietal region convincingly relates to acute white matter infarction. No focal insult affects the brainstem or cerebellum. Cerebral hemispheres elsewhere show pronounced atrophy and gliosis of both frontal lobes and temporal tips suggesting previous close head injury. Chronic small-vessel ischemic changes affect the cerebral hemispheric white matter elsewhere. No evidence of obstructive hydrocephalus or extra-axial  collection. Vascular: Major vessels at the base of the brain show flow. Skull and upper cervical spine: Negative Sinuses/Orbits: Clear/normal Other: None MRA HEAD FINDINGS Both internal carotid arteries are patent through the skull base and siphon regions. There is artifactual signal loss in the circle-of-Willis region due to motion visible on the source images. I believe the anterior and middle cerebral vessels are widely patent. Both vertebral arteries are patent to the basilar. No basilar stenosis. As above, posterior circulation branch vessels appear patent but are poorly seen because of motion artifact. IMPRESSION: Scattered foci restricted diffusion in the parietal white matter on both sides consistent with acute deep white matter infarctions. Bilateral nature suggests that this could be due to hypoperfusion or watershed insults. Some of the appearance may relate to T2 shine through, but at least 1 of the foci in the right parietal region show definite low signal on the ADC map. 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 Background pattern of chronic small vessel ischemic changes of the white matter as seen previously. Motion degraded MR angiography. I do not think there is any large or medium vessel occlusion or true proximal stenosis. Signal loss in the circle-of-Willis region relates to motion on the source images. Electronically Signed   By: Paulina FusiMark  Shogry M.D.   On: 11/18/2018 10:28   Mr Cervical Spine Wo Contrast  Result Date: 11/18/2018 CLINICAL DATA:  Myelopathy.  Neck pain. EXAM: MRI CERVICAL SPINE WITHOUT CONTRAST TECHNIQUE: Multiplanar, multisequence MR imaging of the cervical spine was performed. No intravenous contrast was administered. COMPARISON:  None. FINDINGS: Despite sedation and repeated imaging attempts, the study is significantly motion degraded throughout. The axial T2 gradient echo sequence is severely degraded and of no diagnostic  value. Alignment: Normal. Vertebrae: No fracture, suspicious osseous lesion, or significant marrow edema. Previous C5-6  ACDF. Cord: No spinal cord signal abnormality identified within limitations of motion artifact. Posterior Fossa, vertebral arteries, paraspinal tissues: Grossly unremarkable. Disc levels: C2-3 through C4-5: No gross disc herniation. Widely patent spinal canal. No gross neural foraminal stenosis. C5-6: Prior ACDF. Widely patent spinal canal. No evidence of compressive neural foraminal stenosis. C6-7: No evidence of disc herniation or stenosis. C7-T1: No evidence of disc herniation or stenosis. IMPRESSION: 1. Severely motion degraded examination. 2. C5-6 ACDF with widely patent spinal canal. 3. No gross disc herniation or stenosis. Electronically Signed   By: Sebastian Ache M.D.   On: 11/18/2018 19:43   US Carotid Bilateral  Result Date: 11/18/2018 CLINICAL DATA:  58 year old female with altered mental status x3 weeks. EXAM: BILATERAL CAROTID DUPLEX ULTRASOUND TECHNIQUE: Wallace Cullens scale imaging, color Doppler and duplex ultrasound were performed of bilateral carotid and vertebral arteries in the neck. COMPARISON:  None. FINDINGS: Criteria: Quantification of carotid stenosis is based on velocity parameters that correlate the residual internal carotid diameter with NASCET-based stenosis levels, using the diameter of the distal internal carotid lumen as the denominator for stenosis measurement. The following velocity measurements were obtained: RIGHT ICA: 66/16 cm/sec CCA: 89/16 cm/sec SYSTOLIC ICA/CCA RATIO:  0.6 ECA:  130 cm/sec LEFT ICA: 74/23 cm/sec CCA: 82/13 cm/sec SYSTOLIC ICA/CCA RATIO:  0.9 ECA:  104 cm/sec RIGHT CAROTID ARTERY: Heterogeneous atherosclerotic plaque in the proximal internal carotid artery. By peak systolic velocity criteria, the estimated stenosis was less than 50%. RIGHT VERTEBRAL ARTERY:  Patent with normal antegrade flow. LEFT CAROTID ARTERY: Mild heterogeneous atherosclerotic  plaque in the proximal internal carotid artery. By peak systolic velocity criteria, the estimated stenosis is less than 50%. LEFT VERTEBRAL ARTERY:  Patent with normal antegrade flow. IMPRESSION: 1. Mild (1-49%) stenosis proximal right internal carotid artery secondary to mild heterogeneous atherosclerotic plaque. 2. Mild (1-49%) stenosis proximal left internal carotid artery secondary to mild heterogeneous atherosclerotic plaque. 3. Vertebral arteries are patent with normal antegrade flow. Signed, Sterling Big, MD, RPVI [facility] Radiology Electronically Signed   By: Malachy Moan M.D.   On: 11/18/2018 16:23   Dg Chest Port 1 View  Result Date: 11/17/2018 CLINICAL DATA:  Altered level of consciousness EXAM: PORTABLE CHEST 1 VIEW COMPARISON:  Chest radiograph 10/06/2018 FINDINGS: Hypoventilatory changes in the lungs with vascular crowding. No consolidation, features of edema, pneumothorax, or effusion. Cardiomediastinal contours are unremarkable. No acute osseous or soft tissue abnormality. The likely postsurgical changes from prior left distal clavicular resection. Mass where the base the neck. Cervical fusion hardware, largely collimated from view. IMPRESSION: Hypoventilatory changes and atelectasis, otherwise no acute cardiopulmonary abnormality Electronically Signed   By: Kreg Shropshire M.D.   On: 11/17/2018 16:39        Scheduled Meds:  aspirin EC  325 mg Oral Daily   atorvastatin  40 mg Oral q1800   Chlorhexidine Gluconate Cloth  6 each Topical Q0600   cyanocobalamin  1,000 mcg Intramuscular Once   enoxaparin (LOVENOX) injection  40 mg Subcutaneous Q24H   insulin aspart  0-5 Units Subcutaneous QHS   insulin aspart  0-9 Units Subcutaneous TID WC   Continuous Infusions:   LOS: 2 days    Time spent: 30 minutes    Larron Armor Hoover Brunette, DO [facility] Pager [phone]  If 7PM-7AM, please contact  night-coverage [email or web address] Password [facility] 11/19/2018, 12:36 PM

## 2018-11-20 LAB — CULTURE, BLOOD (ROUTINE X 2): Special Requests: ADEQUATE

## 2018-11-20 LAB — GLUCOSE, CAPILLARY: Glucose-Capillary: 152 mg/dL — ABNORMAL HIGH (ref 70–99)

## 2018-11-20 LAB — HIV ANTIBODY (ROUTINE TESTING W REFLEX): HIV Screen 4th Generation wRfx: NONREACTIVE

## 2018-11-20 MED ORDER — ATORVASTATIN CALCIUM 40 MG PO TABS: 40.0000 mg | ORAL_TABLET | Freq: Every day | ORAL | 3 refills | Status: DC

## 2018-11-20 MED ORDER — ASPIRIN 325 MG PO TBEC: 325.0000 mg | DELAYED_RELEASE_TABLET | Freq: Every day | ORAL | 3 refills | Status: DC

## 2018-11-20 NOTE — Plan of Care (Signed)
  Problem: Acute Rehab PT Goals(only PT should resolve) Goal: Pt will Roll Supine to Side Outcome: Progressing Flowsheets (Taken 11/20/2018 0917) Pt will Roll Supine to Side: min guard Goal: Pt Will Go Sit To Supine/Side Outcome: Progressing Flowsheets (Taken 11/20/2018 0917) Pt will go Sit to Supine/Side: with supervision Goal: Patient Will Perform Sitting Balance Outcome: Progressing Flowsheets (Taken 11/20/2018 0917) Patient will perform sitting balance: with supervision Goal: Patient Will Transfer Sit To/From Stand Outcome: Progressing Flowsheets (Taken 11/20/2018 0917) Patient will transfer sit to/from stand: with supervision Goal: Pt Will Transfer Bed To Chair/Chair To Bed Outcome: Progressing Flowsheets (Taken 11/20/2018 0917) Pt will Transfer Bed to Chair/Chair to Bed: with supervision Goal: Pt Will Ambulate Outcome: Progressing Flowsheets (Taken 11/20/2018 0917) Pt will Ambulate:  with least restrictive assistive device  25 feet Goal: Pt Will Go Up/Down Stairs Outcome: Progressing Flowsheets (Taken 11/20/2018 0917) Pt will Go Up / Down Stairs:  3-5 stairs  with minimal assist    Clarene Critchley PT, DPT 9:18 AM, 11/20/18 [phone]

## 2018-11-20 NOTE — Progress Notes (Signed)
Walked patient with husband to bathroom and to door.

## 2018-11-20 NOTE — Evaluation (Signed)
Physical Therapy Evaluation Patient Details Name: Monica MassonRochelle M Corsello MRN: [id number] DOB: 05-12-1960 Today's Date: 11/20/2018   History of Present Illness  Monica Ayers is a 58 y.o. female with medical history significant of seasonal allergies, type 2 diabetes, hypertension who is coming to the emergency department brought by her husband due to 3 weeks of progressively worse mental status.  Apparently the patient has not gotten much out of bed, not eating much and not answering questions properly.  She is unable to provide further history.  Her husband denies any history of recent head trauma.  Fever, vomiting or diarrhea to his knowledge.    Clinical Impression  Patient awake and alert upon entering room. Patient is not oriented to person, place or time. Therapist provided 1 step commands to sit up. Demonstrated sitting up and patient said "yes" that she would do it, but then did not move. Therapist demonstrated walking with and without a walker and stated "let's walk" "walk with me" "walk over here" "get up" patient moved LEs to edge of bed and requested assistance for assistance at her trunk, but would not allow therapist to assist at lower extremities and yelled anytime therapist touched them. After several attempts, attempted performing exercises in bed. Patient inconsistently followed 1-step commands. Patient would perform the exercise and then stop performing. After performing heel slides, patient would not perform any other exercises despite maximal verbal cues, demonstration and encouragement. Discussed with RN, she also came to room, spoke with husband about patient's status and baseline and he plans to come later today to assess patient's mobility. Therapist currently recommending venue below as patient requires further skilled therapy to improve activity tolerance, strength, and overall functional independence in order for patient to return home safely.    Follow Up Recommendations SNF    Equipment Recommendations  Other (comment)(May be determined at next venue of care)    Recommendations for Other Services       Precautions / Restrictions Precautions Precautions: Fall Restrictions Weight Bearing Restrictions: No      Mobility  Bed Mobility Overal bed mobility: Needs Assistance Bed Mobility: Supine to Sit     Supine to sit: HOB elevated     General bed mobility comments: Patient requiring assistance to lift trunk off of bed in an effort to come to EOB, patient would not allow therapist to assist with patient's lower extremities. Patient yelling when therapist attempted.  Transfers                 General transfer comment: Deferred due to patient non-participatory  Ambulation/Gait                Stairs            Wheelchair Mobility    Modified Rankin (Stroke Patients Only)       Balance Overall balance assessment: (Not able to assess as patient would not follow commands to come to sitting)                                           Pertinent Vitals/Pain Pain Assessment: No/denies pain    Home Living Family/patient expects to be discharged to:: Private residence Living Arrangements: Spouse/significant other Available Help at Discharge: Family;Available 24 hours/day Type of Home: Mobile home Home Access: Stairs to enter Entrance Stairs-Rails: Can reach both Entrance Stairs-Number of Steps: Unknown Home Layout: One level Home  Equipment: Gilford Rile - 2 wheels      Prior Function Level of Independence: Needs assistance   Gait / Transfers Assistance Needed: Required supervision and encouragement per husband report           Hand Dominance        Extremity/Trunk Assessment                Communication   Communication: Other (comment)(Cognitive impairments limiting communication)  Cognition Arousal/Alertness: Awake/alert Behavior During Therapy: Agitated(Patient agitated when therapist  attempts to assist patient) Overall Cognitive Status: No family/caregiver present to determine baseline cognitive functioning                                 General Comments: Patient not oriented to person place or time      General Comments      Exercises General Exercises - Lower Extremity Ankle Circles/Pumps: Other (comment)(Attempted, but patient would not participate and would not allow assistance for PROM) Heel Slides: 10 reps;Right;Left;Supine;Other (comment)(Maximal verbal cues and demonstration) Hip ABduction/ADduction: Other (comment)(Attempted, but patient would not participate and would not allow assistance for PROM)   Assessment/Plan    PT Assessment Patient needs continued PT services  PT Problem List Decreased strength;Decreased mobility;Decreased safety awareness;Decreased coordination;Decreased activity tolerance       PT Treatment Interventions DME instruction;Gait training;Stair training;Functional mobility training;Therapeutic activities;Therapeutic exercise;Balance training;Neuromuscular re-education;Patient/family education;Wheelchair mobility training    PT Goals (Current goals can be found in the Care Plan section)  Acute Rehab PT Goals Patient Stated Goal: Patient's husband wishes for patient to return home. PT Goal Formulation: With family(Husband over the phone) Time For Goal Achievement: 11/20/18 Potential to Achieve Goals: Fair    Frequency 7X/week   Barriers to discharge        Co-evaluation               AM-PAC PT "6 Clicks" Mobility  Outcome Measure Help needed turning from your back to your side while in a flat bed without using bedrails?: Total Help needed moving from lying on your back to sitting on the side of a flat bed without using bedrails?: Total Help needed moving to and from a bed to a chair (including a wheelchair)?: Total Help needed standing up from a chair using your arms (e.g., wheelchair or bedside  chair)?: Total Help needed to walk in hospital room?: Total Help needed climbing 3-5 steps with a railing? : Total 6 Click Score: 6    End of Session   Activity Tolerance: Treatment limited secondary to agitation Patient left: in bed;with call bell/phone within reach;with nursing/sitter in room;with bed alarm set Nurse Communication: Mobility status PT Visit Diagnosis: Muscle weakness (generalized) (M62.81);Difficulty in walking, not elsewhere classified (R26.2)    Time: 0822-0900 PT Time Calculation (min) (ACUTE ONLY): 38 min   Charges:   PT Evaluation $PT Eval Moderate Complexity: 1 Mod         Clarene Critchley PT, DPT 9:17 AM, 11/20/18 [phone]

## 2018-11-20 NOTE — TOC Transition Note (Signed)
Transition of Care [facility]) - CM/SW Discharge Note   Patient Details  Name: Monica Ayers MRN: [id number] Date of Birth: [birth year]  Transition of Care [facility]) CM/SW Contact:  Latanya Maudlin, RN Phone Number: 11/20/2018, 10:56 AM   Clinical Narrative:  Patient to be discharged per MD order. Orders in place for home health services. Referral was previously given to [facility]. Notified Helene Kelp of pending discharge. No DME needed. Family to transport.     Final next level of care: [place] Barriers to Discharge: No Barriers Identified   Patient Goals and CMS Choice Patient states their goals for this hospitalization and ongoing recovery are:: to go home. CMS Medicare.gov Compare Post Acute Care list provided to:: Other (Comment Required)(husband) Choice offered to / list presented to : Patient  Discharge Placement                       Discharge Plan and Services   Discharge Planning Services: CM Consult Post Acute Care Choice: Home Health                    HH Arranged: PT [place]: [facility] Date [place]: 11/20/18 Time [place]: 1056 Representative spoke with at [place]: [place] ([place]) Interventions     Readmission Risk Interventions Readmission Risk Prevention Plan 11/20/2018  Post Dischage Appt Complete  Medication Screening Complete  Transportation Screening Complete

## 2018-11-20 NOTE — Plan of Care (Signed)
  Problem: Education: Goal: Knowledge of General Education information will improve Description: Including pain rating scale, medication(s)/side effects and non-pharmacologic comfort measures 11/20/2018 1120 by Santa Lighter, RN Outcome: Adequate for Discharge 11/20/2018 1119 by Santa Lighter, RN Outcome: Progressing   Problem: Clinical Measurements: Goal: Ability to maintain clinical measurements within normal limits will improve Outcome: Adequate for Discharge Goal: Will remain free from infection Outcome: Adequate for Discharge Goal: Diagnostic test results will improve Outcome: Adequate for Discharge Goal: Respiratory complications will improve Outcome: Adequate for Discharge Goal: Cardiovascular complication will be avoided Outcome: Adequate for Discharge   Problem: Activity: Goal: Risk for activity intolerance will decrease Outcome: Adequate for Discharge   Problem: Nutrition: Goal: Adequate nutrition will be maintained Outcome: Adequate for Discharge   Problem: Coping: Goal: Level of anxiety will decrease Outcome: Adequate for Discharge   Problem: Elimination: Goal: Will not experience complications related to bowel motility Outcome: Adequate for Discharge Goal: Will not experience complications related to urinary retention Outcome: Adequate for Discharge   Problem: Pain Managment: Goal: General experience of comfort will improve Outcome: Adequate for Discharge   Problem: Safety: Goal: Ability to remain free from injury will improve Outcome: Adequate for Discharge   Problem: Skin Integrity: Goal: Risk for impaired skin integrity will decrease Outcome: Adequate for Discharge

## 2018-11-20 NOTE — Plan of Care (Signed)
  Problem: Education: Goal: Knowledge of General Education information will improve Description Including pain rating scale, medication(s)/side effects and non-pharmacologic comfort measures Outcome: Progressing   Problem: Health Behavior/Discharge Planning: Goal: Ability to manage health-related needs will improve Outcome: Progressing   

## 2018-11-20 NOTE — Progress Notes (Signed)
Nsg Discharge Note  Admit Date:  11/17/2018 Discharge date: 11/20/2018   Fidela Salisbury Schmid to be D/C'd Home per MD order.  AVS completed.  Copy for chart, and copy for patient signed, and dated. Patient/caregiver able to verbalize understanding. Removed IV-clean, dry, intact. Reviewed d/c paperwork with patient and husband. Wheeled stable patient and belongings to ss entrance.  Discharge Medication: Allergies as of 11/20/2018      Reactions   Latex Rash      Medication List    STOP taking these medications   sulfamethoxazole-trimethoprim 800-160 MG tablet Commonly known as: BACTRIM DS     TAKE these medications   aspirin 325 MG EC tablet Take 1 tablet (325 mg total) by mouth daily. Start taking on: November 21, 2018   atorvastatin 40 MG tablet Commonly known as: LIPITOR Take 1 tablet (40 mg total) by mouth daily at 6 PM.   metFORMIN 500 MG tablet Commonly known as: GLUCOPHAGE Take 500 mg by mouth daily.       Discharge Assessment: Vitals:   11/19/18 2123 11/20/18 0636  BP: (!) 177/90 (!) 179/98  Pulse: 97 88  Resp: 18   Temp: 99.5 F (37.5 C)   SpO2: 100% 97%   Skin clean, dry and intact without evidence of skin break down, no evidence of skin tears noted. IV catheter discontinued intact. Site without signs and symptoms of complications - no redness or edema noted at insertion site, patient denies c/o pain - only slight tenderness at site.  Dressing with slight pressure applied.  D/c Instructions-Education: Discharge instructions given to patient/family with verbalized understanding. D/c education completed with patient/family including follow up instructions, medication list, d/c activities limitations if indicated, with other d/c instructions as indicated by MD - patient able to verbalize understanding, all questions fully answered. Patient instructed to return to ED, call 911, or call MD for any changes in condition.  Patient escorted via [place], and D/C home via private  auto.  Santa Lighter, RN 11/20/2018 11:59 AM

## 2018-11-20 NOTE — Discharge Summary (Signed)
Physician Discharge Summary  Monica MassonRochelle M Ayers NUU:[id number] DOB: 07-12-1960 DOA: Ayers  PCP: Monica Ayers  Admit date: [facility]  Discharge date: 11/20/2018  Admitted From:Home  Disposition:  Home  Recommendations for Outpatient Follow-up:  1. Follow up with PCP in 1-2 weeks 2. Continue to remain on full dose aspirin as well as statin as prescribed 3. Follow-up with Dr. Gerilyn Ayers in 4 weeks outpatient to consider EEG as needed  Home Health: Yes with PT  Equipment/Devices: None  Discharge Condition: Stable  CODE STATUS: Full  Diet recommendation: Heart Healthy/carb modified  Brief/Interim Summary: Per HPI: Monica StallRochelle M VERNONis a 58 y.o.femalewith medical history significant ofseasonal allergies, type 2 diabetes, hypertension who is coming to the emergency department brought by her husband due to 3 weeks of progressively worse mental status. Apparently the patient has not gotten much out of bed, not eating much and not answering questions properly. She is unable to provide further history. Her husband denies any history of recent head trauma. Fever, vomiting or diarrhea to his knowledge.  Patient was admitted with acute encephalopathy suspected to be metabolic secondary to sepsis and was started on cefepime, vancomycin, and Flagyl. She was noted to have elevated lactic acidosis as well. She had remained on some IV fluids. She has undergone MRI of the brain demonstrating acute, deep white matter CVAs this morning. She is already noted to have frontal lobe dementia with prior closed head injury.  She has been seen by neurology on 7/17 with cervical MRI performed with no acute findings.  Patient was not very cooperative with physical therapy and therefore, it was uncertain whether or not patient required SNF placement, but with husband at bedside she is able to stand up and ambulate more and appears to be stable for discharge with home health physical therapy.  EEG  was also recommended by neurology and this can be considered in the outpatient setting as patient is back to her usual baseline according to the husband at bedside.  She is to remain on full dose aspirin as well as statin as prescribed on discharge and follow-up with neurology in 4 weeks.  She will have home health physical therapy as well.  No other acute events noted throughout the course of this admission.  Discharge Diagnoses:  Principal Problem:   Sepsis due to undetermined organism [facility](HCC) Active Problems:   Essential hypertension   Lactic acidosis   Type 2 diabetes mellitus (HCC)   Frontal lobe dementia (HCC)   Pressure injury of skin   Transaminasemia  Principal discharge diagnoses: Acute on chronic encephalopathy secondary to bilateral deep white matter CVA in the setting of known frontal lobe dementia.  Discharge Instructions  Discharge Instructions    Diet - low sodium heart healthy   Complete by: As directed    Increase activity slowly   Complete by: As directed      Allergies as of 11/20/2018      Reactions   Latex Rash      Medication List    STOP taking these medications   sulfamethoxazole-trimethoprim 800-160 MG tablet Commonly known as: BACTRIM DS     TAKE these medications   aspirin 325 MG EC tablet Take 1 tablet (325 mg total) by mouth daily. Start taking on: November 21, 2018   atorvastatin 40 MG tablet Commonly known as: LIPITOR Take 1 tablet (40 mg total) by mouth daily at 6 PM.   metFORMIN 500 MG tablet Commonly known as: GLUCOPHAGE Take 500 mg by mouth  daily.      Follow-up Information    Monica Loffler, Ayers Follow up in 1 week(s).   Specialties: Physician Assistant, Family Medicine Contact information: [address] [place] [address] [phone]        Monica Ayers Follow up in 4 week(s).   Specialty: Neurology Contact information: [address] [place] [address] [phone]          Allergies  Allergen  Reactions  . Latex Rash    Consultations:  Neurology   Procedures/Studies: Ct Head Wo Contrast  Result Date: Ayers CLINICAL DATA:  Altered mental status. EXAM: CT HEAD WITHOUT CONTRAST TECHNIQUE: Contiguous axial images were obtained from the base of the skull through the vertex without intravenous contrast. COMPARISON:  11/05/2018 FINDINGS: Brain: There is no evidence of acute infarct, intracranial hemorrhage, mass, midline shift, or extra-axial fluid collection. Advanced bilateral frontal and temporal lobe volume loss is again seen. Patchy cerebral white matter hypodensities are unchanged and nonspecific but compatible with moderate chronic small vessel ischemic disease. 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 Vascular: Calcified atherosclerosis at the skull base. No hyperdense vessel. Skull: No fracture or focal osseous lesion. Sinuses/Orbits: Visualized paranasal sinuses and mastoid air cells are clear. Visualized orbits are unremarkable. Other: None. IMPRESSION: 1. No evidence of acute intracranial abnormality. 2. Advanced frontotemporal volume loss. 3. Moderate chronic small vessel ischemic disease. Electronically Signed   By: Monica Ache M.D.   On: 11/17/2018 19:05   Mr Angio Head Wo Contrast  Result Date: 11/18/2018 CLINICAL DATA:  Acute presentation yesterday with altered mental status. EXAM: MRI HEAD WITHOUT CONTRAST MRA HEAD WITHOUT CONTRAST TECHNIQUE: Multiplanar, multiecho pulse sequences of the brain and surrounding structures were obtained without intravenous contrast. Angiographic images of the head were obtained using MRA technique without contrast. COMPARISON:  Head CT 11/17/2018 FINDINGS: MRI HEAD FINDINGS Brain: The study suffers from considerable motion degradation. Diffusion imaging suggests some acute or subacute deep white matter infarctions in both cerebral hemispheres in the parietal regions as might be seen with  hypoperfusion/watershed infarction. There may be some T2 shine through effect, but I think at least 1 focus in the right parietal region convincingly relates to acute white matter infarction. No focal insult affects the brainstem or cerebellum. Cerebral hemispheres elsewhere show pronounced atrophy and gliosis of both frontal lobes and temporal tips suggesting previous close head injury. Chronic small-vessel ischemic changes affect the cerebral hemispheric white matter elsewhere. No evidence of obstructive hydrocephalus or extra-axial collection. Vascular: Major vessels at the base of the brain show flow. Skull and upper cervical spine: Negative Sinuses/Orbits: Clear/normal Other: None MRA HEAD FINDINGS Both internal carotid arteries are patent through the skull base and siphon regions. There is artifactual signal loss in the circle-of-Willis region due to motion visible on the source images. I believe the anterior and middle cerebral vessels are widely patent. Both vertebral arteries are patent to the basilar. No basilar stenosis. As above, posterior circulation branch vessels appear patent but are poorly seen because of motion artifact. IMPRESSION: Scattered foci restricted diffusion in the parietal white matter on both sides consistent with acute deep white matter infarctions. Bilateral nature suggests that this could be due to hypoperfusion or watershed insults. Some of the appearance may relate to T2 shine through, but at least 1 of the foci in the right parietal region show definite low signal on the ADC map. Bilateral frontal and temporal atrophy and gliosis consistent with previous closed  head injury. Background pattern of chronic small vessel ischemic changes of the white matter as seen previously. Motion degraded MR angiography. I do not think there is any large or medium vessel occlusion or true proximal stenosis. Signal loss in the circle-of-Willis region relates to motion on the source images.  Electronically Signed   By: Paulina Fusi M.D.   On: 11/18/2018 10:28   Mr Brain Wo Contrast  Result Date: 11/18/2018 CLINICAL DATA:  Acute presentation yesterday with altered mental status. EXAM: MRI HEAD WITHOUT CONTRAST MRA HEAD WITHOUT CONTRAST TECHNIQUE: Multiplanar, multiecho pulse sequences of the brain and surrounding structures were obtained without intravenous contrast. Angiographic images of the head were obtained using MRA technique without contrast. COMPARISON:  Head CT 11/17/2018 FINDINGS: MRI HEAD FINDINGS Brain: The study suffers from considerable motion degradation. Diffusion imaging suggests some acute or subacute deep white matter infarctions in both cerebral hemispheres in the parietal regions as might be seen with hypoperfusion/watershed infarction. There may be some T2 shine through effect, but I think at least 1 focus in the right parietal region convincingly relates to acute white matter infarction. No focal insult affects the brainstem or cerebellum. Cerebral hemispheres elsewhere show pronounced atrophy and gliosis of both frontal lobes and temporal tips suggesting previous close head injury. Chronic small-vessel ischemic changes affect the cerebral hemispheric white matter elsewhere. No evidence of obstructive hydrocephalus or extra-axial collection. Vascular: Major vessels at the base of the brain show flow. Skull and upper cervical spine: Negative Sinuses/Orbits: Clear/normal Other: None MRA HEAD FINDINGS Both internal carotid arteries are patent through the skull base and siphon regions. There is artifactual signal loss in the circle-of-Willis region due to motion visible on the source images. I believe the anterior and middle cerebral vessels are widely patent. Both vertebral arteries are patent to the basilar. No basilar stenosis. As above, posterior circulation branch vessels appear patent but are poorly seen because of motion artifact. IMPRESSION: Scattered foci restricted  diffusion in the parietal white matter on both sides consistent with acute deep white matter infarctions. Bilateral nature suggests that this could be due to hypoperfusion or watershed insults. Some of the appearance may relate to T2 shine through, but at least 1 of the foci in the right parietal region show definite low signal on the ADC map. 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 Background pattern of chronic small vessel ischemic changes of the white matter as seen previously. Motion degraded MR angiography. I do not think there is any large or medium vessel occlusion or true proximal stenosis. Signal loss in the circle-of-Willis region relates to motion on the source images. Electronically Signed   By: Paulina Fusi M.D.   On: 11/18/2018 10:28   Mr Cervical Spine Wo Contrast  Result Date: 11/18/2018 CLINICAL DATA:  Myelopathy.  Neck pain. EXAM: MRI CERVICAL SPINE WITHOUT CONTRAST TECHNIQUE: Multiplanar, multisequence MR imaging of the cervical spine was performed. No intravenous contrast was administered. COMPARISON:  None. FINDINGS: Despite sedation and repeated imaging attempts, the study is significantly motion degraded throughout. The axial T2 gradient echo sequence is severely degraded and of no diagnostic value. Alignment: Normal. Vertebrae: No fracture, suspicious osseous lesion, or significant marrow edema. Previous C5-6 ACDF. Cord: No spinal cord signal abnormality identified within limitations of motion artifact. Posterior Fossa, vertebral arteries, paraspinal tissues: Grossly unremarkable. Disc levels: C2-3 through C4-5: No gross disc herniation. Widely patent spinal canal. No gross neural foraminal stenosis. C5-6: Prior ACDF. Widely patent spinal  canal. No evidence of compressive neural foraminal stenosis. C6-7: No evidence of disc herniation or stenosis. C7-T1: No evidence of disc herniation or stenosis. IMPRESSION: 1. Severely motion  degraded examination. 2. C5-6 ACDF with widely patent spinal canal. 3. No gross disc herniation or stenosis. Electronically Signed   By: Monica AcheAllen  Grady M.D.   On: 11/18/2018 19:43   [place] Carotid Bilateral  Result Date: 11/18/2018 CLINICAL DATA:  58 year old female with altered mental status x3 weeks. EXAM: BILATERAL CAROTID DUPLEX ULTRASOUND TECHNIQUE: Wallace CullensGray scale imaging, color Doppler and duplex ultrasound were performed of bilateral carotid and vertebral arteries in the neck. COMPARISON:  None. FINDINGS: Criteria: Quantification of carotid stenosis is based on velocity parameters that correlate the residual internal carotid diameter with NASCET-based stenosis levels, using the diameter of the distal internal carotid lumen as the denominator for stenosis measurement. The following velocity measurements were obtained: RIGHT ICA: 66/16 cm/sec CCA: 89/16 cm/sec SYSTOLIC ICA/CCA RATIO:  0.6 ECA:  130 cm/sec LEFT ICA: 74/23 cm/sec CCA: 82/13 cm/sec SYSTOLIC ICA/CCA RATIO:  0.9 ECA:  104 cm/sec RIGHT CAROTID ARTERY: Heterogeneous atherosclerotic plaque in the proximal internal carotid artery. By peak systolic velocity criteria, the estimated stenosis was less than 50%. RIGHT VERTEBRAL ARTERY:  Patent with normal antegrade flow. LEFT CAROTID ARTERY: Mild heterogeneous atherosclerotic plaque in the proximal internal carotid artery. By peak systolic velocity criteria, the estimated stenosis is less than 50%. LEFT VERTEBRAL ARTERY:  Patent with normal antegrade flow. IMPRESSION: 1. Mild (1-49%) stenosis proximal right internal carotid artery secondary to mild heterogeneous atherosclerotic plaque. 2. Mild (1-49%) stenosis proximal left internal carotid artery secondary to mild heterogeneous atherosclerotic plaque. 3. Vertebral arteries are patent with normal antegrade flow. Signed, Sterling BigHeath K. McCullough, Ayers, RPVI [facility] Electronically Signed   By: Malachy MoanHeath  McCullough  M.D.   On: 11/18/2018 16:23   Dg Chest Port 1 View  Result Date: Ayers CLINICAL DATA:  Altered level of consciousness EXAM: PORTABLE CHEST 1 VIEW COMPARISON:  Chest radiograph 10/06/2018 FINDINGS: Hypoventilatory changes in the lungs with vascular crowding. No consolidation, features of edema, pneumothorax, or effusion. Cardiomediastinal contours are unremarkable. No acute osseous or soft tissue abnormality. The likely postsurgical changes from prior left distal clavicular resection. Mass where the base the neck. Cervical fusion hardware, largely collimated from view. IMPRESSION: Hypoventilatory changes and atelectasis, otherwise no acute cardiopulmonary abnormality Electronically Signed   By: Kreg ShropshirePrice  DeHay M.D.   On: 11/17/2018 16:39     Discharge Exam: Vitals:   11/19/18 2123 11/20/18 0636  BP: (!) 177/90 (!) 179/98  Pulse: 97 88  Resp: 18   Temp: 99.5 F (37.5 C)   SpO2: 100% 97%   Vitals:   11/19/18 0540 11/19/18 2121 11/19/18 2123 11/20/18 0636  BP: (!) 175/93  (!) 177/90 (!) 179/98  Pulse: 95 95 97 88  Resp: 18  18   Temp: 99 F (37.2 C)  99.5 F (37.5 C)   TempSrc: Oral  Oral   SpO2: 97% 97% 100% 97%  Weight:      Height:        General: Pt is alert, awake, not in acute distress, baseline confusion noted Cardiovascular: RRR, S1/S2 +, no rubs, no gallops Respiratory: CTA bilaterally, no wheezing, no rhonchi Abdominal: Soft, NT, ND, bowel sounds + Extremities: no edema, no cyanosis    The results of significant diagnostics from this hospitalization (including imaging, microbiology, ancillary and laboratory) are listed below for reference.     Microbiology: Recent Results (  from the past 240 hour(s))  SARS Coronavirus 2 (CEPHEID - Performed in [facility] hospital lab), Hosp Order     Status: None   Collection Time: 11/17/18  8:40 PM   Specimen: Nasopharyngeal Swab  Result Value Ref Range Status   SARS Coronavirus 2 NEGATIVE NEGATIVE Final    Comment:  (NOTE) If result is NEGATIVE SARS-CoV-2 target nucleic acids are NOT DETECTED. The SARS-CoV-2 RNA is generally detectable in upper and lower  respiratory specimens during the acute phase of infection. The lowest  concentration of SARS-CoV-2 viral copies this assay can detect is 250  copies / mL. A negative result does not preclude SARS-CoV-2 infection  and should not be used as the sole basis for treatment or other  patient management decisions.  A negative result may occur with  improper specimen collection / handling, submission of specimen other  than nasopharyngeal swab, presence of viral mutation(s) within the  areas targeted by this assay, and inadequate number of viral copies  (<250 copies / mL). A negative result must be combined with clinical  observations, patient history, and epidemiological information. If result is POSITIVE SARS-CoV-2 target nucleic acids are DETECTED. The SARS-CoV-2 RNA is generally detectable in upper and lower  respiratory specimens dur ing the acute phase of infection.  Positive  results are indicative of active infection with SARS-CoV-2.  Clinical  correlation with patient history and other diagnostic information is  necessary to determine patient infection status.  Positive results do  not rule out bacterial infection or co-infection with other viruses. If result is PRESUMPTIVE POSTIVE SARS-CoV-2 nucleic acids MAY BE PRESENT.   A presumptive positive result was obtained on the submitted specimen  and confirmed on repeat testing.  While 2019 novel coronavirus  (SARS-CoV-2) nucleic acids may be present in the submitted sample  additional confirmatory testing may be necessary for epidemiological  and / or clinical management purposes  to differentiate between  SARS-CoV-2 and other Sarbecovirus currently known to infect humans.  If clinically indicated additional testing with an alternate test  methodology [phone]) is advised. The SARS-CoV-2 RNA is  generally  detectable in upper and lower respiratory sp ecimens during the acute  phase of infection. The expected result is Negative. Fact Sheet for Patients:  BoilerBrush.com.cy Fact Sheet for Healthcare Providers: https://pope.com/ This test is not yet approved or cleared by the [place] FDA and has been authorized for detection and/or diagnosis of SARS-CoV-2 by FDA under an Emergency Use Authorization (EUA).  This EUA will remain in effect (meaning this test can be used) for the duration of the COVID-19 declaration under Section 564(b)(1) of the Act, 21 U.S.C. section 360bbb-3(b)(1), unless the authorization is terminated or revoked sooner. Performed at [facility], [address], [place], [place] [address]   Culture, blood (x 2)     Status: None (Preliminary result)   Collection Time: 11/17/18  8:57 PM   Specimen: BLOOD  Result Value Ref Range Status   Specimen Description BLOOD LEFT ANTECUBITAL  Final   Special Requests   Final    BOTTLES DRAWN AEROBIC AND ANAEROBIC Blood Culture adequate volume   Culture   Final    NO GROWTH 3 DAYS Performed at [facility], [address] [place], [place] [address]    Report Status PENDING  Incomplete  Culture, blood (x 2)     Status: None (Preliminary result)   Collection Time: 11/17/18  8:59 PM   Specimen: BLOOD  Result Value Ref Range Status  Specimen Description   Final    BLOOD BLOOD RIGHT WRIST Performed at [facility], [address], [place], [place] [address]    Special Requests   Final    BOTTLES DRAWN AEROBIC AND ANAEROBIC Blood Culture adequate volume Performed at [facility], [address], [place], [place] [address]    Culture  Setup Time   Final    GRAM POSITIVE COCCI ANAEROBIC BOTTLE ONLY Gram Stain Report Called to,Read Back By and Verified With: BENGSTON P. AT 1402 BY THOMPSON S. CRITICAL RESULT CALLED TO, READ BACK BY AND VERIFIED WITH: Jean Rosenthal RN 1747  11/18/18 A BROWNING    Culture   Final    GRAM POSITIVE COCCI IDENTIFICATION TO FOLLOW Performed at [facility], [address] [place], [place] [address]    Report Status PENDING  Incomplete  Blood Culture ID Panel (Reflexed)     Status: Abnormal   Collection Time: 11/17/18  8:59 PM  Result Value Ref Range Status   Enterococcus species NOT DETECTED NOT DETECTED Final   Listeria monocytogenes NOT DETECTED NOT DETECTED Final   Staphylococcus species DETECTED (A) NOT DETECTED Final    Comment: Methicillin (oxacillin) resistant coagulase negative staphylococcus. Possible blood culture contaminant (unless isolated from more than one blood culture draw or clinical case suggests pathogenicity). No antibiotic treatment is indicated for blood  culture contaminants. CRITICAL RESULT CALLED TO, READ BACK BY AND VERIFIED WITH: Jean Rosenthal RN 1747 11/18/18 A BROWNING    Staphylococcus aureus (BCID) NOT DETECTED NOT DETECTED Final   Methicillin resistance DETECTED (A) NOT DETECTED Final    Comment: CRITICAL RESULT CALLED TO, READ BACK BY AND VERIFIED WITH: P BENGTSON RN 1747 11/18/18 A BROWNING    Streptococcus species NOT DETECTED NOT DETECTED Final   Streptococcus agalactiae NOT DETECTED NOT DETECTED Final   Streptococcus pneumoniae NOT DETECTED NOT DETECTED Final   Streptococcus pyogenes NOT DETECTED NOT DETECTED Final   Acinetobacter baumannii NOT DETECTED NOT DETECTED Final   Enterobacteriaceae species NOT DETECTED NOT DETECTED Final   Enterobacter cloacae complex NOT DETECTED NOT DETECTED Final   Escherichia coli NOT DETECTED NOT DETECTED Final   Klebsiella oxytoca NOT DETECTED NOT DETECTED Final   Klebsiella pneumoniae NOT DETECTED NOT DETECTED Final   Proteus species NOT DETECTED NOT DETECTED Final   Serratia marcescens NOT DETECTED NOT DETECTED Final   Haemophilus influenzae NOT DETECTED NOT DETECTED Final   Neisseria meningitidis NOT DETECTED NOT DETECTED Final   Pseudomonas  aeruginosa NOT DETECTED NOT DETECTED Final   Candida albicans NOT DETECTED NOT DETECTED Final   Candida glabrata NOT DETECTED NOT DETECTED Final   Candida krusei NOT DETECTED NOT DETECTED Final   Candida parapsilosis NOT DETECTED NOT DETECTED Final   Candida tropicalis NOT DETECTED NOT DETECTED Final    Comment: Performed at [facility], [address], [place], [place] [address]  MRSA PCR Screening     Status: None   Collection Time: 11/17/18 10:22 PM   Specimen: Nasal Mucosa; Nasopharyngeal  Result Value Ref Range Status   MRSA by PCR NEGATIVE NEGATIVE Final    Comment:        The GeneXpert MRSA Assay (FDA approved for NASAL specimens only), is one component of a comprehensive MRSA colonization surveillance program. It is not intended to diagnose MRSA infection nor to guide or monitor treatment for MRSA infections. Performed at [facility], [address] [place], [place] [address]      Labs: BNP (last 3 results)  No results for input(s): BNP in the last 8760 hours. Basic Metabolic Panel: Recent Labs  Lab 11/17/18 1529 11/18/18 0800 11/19/18 0538  NA 134* 139 140  K 3.8 3.9 3.6  CL 100 108 104  CO2 21* 21* 26  GLUCOSE 306* 169* 181*  BUN CREATININE 0.63 0.51 0.43*  CALCIUM 8.9 8.4* 9.0  MG 2.1  --   --   PHOS 4.2  --   --    Liver Function Tests: Recent Labs  Lab 11/17/18 1529 11/18/18 0800 11/19/18 0538  AST 56* 40 34  ALT 52* 42 38  ALKPHOS 73 66 64  BILITOT 0.8 0.5 0.7  PROT 7.0 7.0 6.6  ALBUMIN 3.6 3.5 3.4*   No results for input(s): LIPASE, AMYLASE in the last 168 hours. Recent Labs  Lab 11/17/18 1752  AMMONIA 17   CBC: Recent Labs  Lab 11/17/18 1529 11/18/18 0800 11/19/18 0538  WBC 15.9* 8.5 8.8  NEUTROABS 13.8* 6.1  --   HGB 15.3* 13.8 13.6  HCT 44.5 42.3 40.7  MCV 84.1 86.5 85.5  PLT 384 341 348   Cardiac Enzymes: No results for input(s): CKTOTAL, CKMB, CKMBINDEX, TROPONINI in the last 168 hours. BNP: Invalid  input(s): POCBNP CBG: Recent Labs  Lab 11/19/18 0714 11/19/18 1107 11/19/18 1639 11/19/18 2125 11/20/18 0733  GLUCAP 150* 171* 229* 132* 152*   D-Dimer No results for input(s): DDIMER in the last 72 hours. Hgb A1c Recent Labs    11/17/18 1529  HGBA1C 6.7*   Lipid Profile Recent Labs    11/18/18 0800  CHOL 184  HDL 34*  LDLCALC 111*  TRIG 195*  CHOLHDL 5.4   Thyroid function studies Recent Labs    11/17/18 2059  TSH 0.427   Anemia work up Recent Labs    11/17/18 2059  VITAMINB12 315   Urinalysis    Component Value Date/Time   COLORURINE YELLOW 11/17/2018 1747   APPEARANCEUR CLEAR 11/17/2018 1747   LABSPEC 1.021 11/17/2018 1747   PHURINE 5.0 11/17/2018 1747   GLUCOSEU >=500 (A) 11/17/2018 1747   HGBUR NEGATIVE 11/17/2018 1747   BILIRUBINUR NEGATIVE 11/17/2018 1747   KETONESUR NEGATIVE 11/17/2018 1747   PROTEINUR NEGATIVE 11/17/2018 1747   NITRITE NEGATIVE 11/17/2018 1747   LEUKOCYTESUR NEGATIVE 11/17/2018 1747   Sepsis Labs Invalid input(s): PROCALCITONIN,  WBC,  LACTICIDVEN Microbiology Recent Results (from the past 240 hour(s))  SARS Coronavirus 2 (CEPHEID - Performed in [facility] hospital lab), Hosp Order     Status: None   Collection Time: 11/17/18  8:40 PM   Specimen: Nasopharyngeal Swab  Result Value Ref Range Status   SARS Coronavirus 2 NEGATIVE NEGATIVE Final    Comment: (NOTE) If result is NEGATIVE SARS-CoV-2 target nucleic acids are NOT DETECTED. The SARS-CoV-2 RNA is generally detectable in upper and lower  respiratory specimens during the acute phase of infection. The lowest  concentration of SARS-CoV-2 viral copies this assay can detect is 250  copies / mL. A negative result does not preclude SARS-CoV-2 infection  and should not be used as the sole basis for treatment or other  patient management decisions.  A negative result may occur with  improper specimen collection / handling, submission of specimen other  than  nasopharyngeal swab, presence of viral mutation(s) within the  areas targeted by this assay, and inadequate number of viral copies  (<250 copies / mL). A negative result must be combined with clinical  observations, patient history, and epidemiological information. If  result is POSITIVE SARS-CoV-2 target nucleic acids are DETECTED. The SARS-CoV-2 RNA is generally detectable in upper and lower  respiratory specimens dur ing the acute phase of infection.  Positive  results are indicative of active infection with SARS-CoV-2.  Clinical  correlation with patient history and other diagnostic information is  necessary to determine patient infection status.  Positive results do  not rule out bacterial infection or co-infection with other viruses. If result is PRESUMPTIVE POSTIVE SARS-CoV-2 nucleic acids MAY BE PRESENT.   A presumptive positive result was obtained on the submitted specimen  and confirmed on repeat testing.  While 2019 novel coronavirus  (SARS-CoV-2) nucleic acids may be present in the submitted sample  additional confirmatory testing may be necessary for epidemiological  and / or clinical management purposes  to differentiate between  SARS-CoV-2 and other Sarbecovirus currently known to infect humans.  If clinically indicated additional testing with an alternate test  methodology [phone]) is advised. The SARS-CoV-2 RNA is generally  detectable in upper and lower respiratory sp ecimens during the acute  phase of infection. The expected result is Negative. Fact Sheet for Patients:  StrictlyIdeas.no Fact Sheet for Healthcare Providers: BankingDealers.co.za This test is not yet approved or cleared by the [place] FDA and has been authorized for detection and/or diagnosis of SARS-CoV-2 by FDA under an Emergency Use Authorization (EUA).  This EUA will remain in effect (meaning this test can be used) for the duration of  the COVID-19 declaration under Section 564(b)(1) of the Act, 21 U.S.C. section 360bbb-3(b)(1), unless the authorization is terminated or revoked sooner. Performed at [facility], [address], [place], [place] [address]   Culture, blood (x 2)     Status: None (Preliminary result)   Collection Time: 11/17/18  8:57 PM   Specimen: BLOOD  Result Value Ref Range Status   Specimen Description BLOOD LEFT ANTECUBITAL  Final   Special Requests   Final    BOTTLES DRAWN AEROBIC AND ANAEROBIC Blood Culture adequate volume   Culture   Final    NO GROWTH 3 DAYS Performed at [facility], [address] [place], [place] [address]    Report Status PENDING  Incomplete  Culture, blood (x 2)     Status: None (Preliminary result)   Collection Time: 11/17/18  8:59 PM   Specimen: BLOOD  Result Value Ref Range Status   Specimen Description   Final    BLOOD BLOOD RIGHT WRIST Performed at [facility], [address] [place], [place] [address]    Special Requests   Final    BOTTLES DRAWN AEROBIC AND ANAEROBIC Blood Culture adequate volume Performed at [facility], [address], [place], [place] [address]    Culture  Setup Time   Final    GRAM POSITIVE COCCI ANAEROBIC BOTTLE ONLY Gram Stain Report Called to,Read Back By and Verified With: BENGSTON P. AT 1402 BY THOMPSON S. CRITICAL RESULT CALLED TO, READ BACK BY AND VERIFIED WITH: Virl Axe RN 6789 11/18/18 A BROWNING    Culture   Final    GRAM POSITIVE COCCI IDENTIFICATION TO FOLLOW Performed at [facility], [place] [address], [place], [place] [address]    Report Status PENDING  Incomplete  Blood Culture ID Panel (Reflexed)     Status: Abnormal   Collection Time: 11/17/18  8:59 PM  Result Value Ref Range Status   Enterococcus species NOT DETECTED NOT DETECTED Final   Listeria monocytogenes NOT DETECTED NOT DETECTED Final   Staphylococcus species  DETECTED (A) NOT DETECTED Final    Comment: Methicillin (oxacillin) resistant coagulase  negative staphylococcus. Possible blood culture contaminant (unless isolated from more than one blood culture draw or clinical case suggests pathogenicity). No antibiotic treatment is indicated for blood  culture contaminants. CRITICAL RESULT CALLED TO, READ BACK BY AND VERIFIED WITH: Jean Rosenthal BENGTSON RN 1747 11/18/18 A BROWNING    Staphylococcus aureus (BCID) NOT DETECTED NOT DETECTED Final   Methicillin resistance DETECTED (A) NOT DETECTED Final    Comment: CRITICAL RESULT CALLED TO, READ BACK BY AND VERIFIED WITH: P BENGTSON RN 1747 11/18/18 A BROWNING    Streptococcus species NOT DETECTED NOT DETECTED Final   Streptococcus agalactiae NOT DETECTED NOT DETECTED Final   Streptococcus pneumoniae NOT DETECTED NOT DETECTED Final   Streptococcus pyogenes NOT DETECTED NOT DETECTED Final   Acinetobacter baumannii NOT DETECTED NOT DETECTED Final   Enterobacteriaceae species NOT DETECTED NOT DETECTED Final   Enterobacter cloacae complex NOT DETECTED NOT DETECTED Final   Escherichia coli NOT DETECTED NOT DETECTED Final   Klebsiella oxytoca NOT DETECTED NOT DETECTED Final   Klebsiella pneumoniae NOT DETECTED NOT DETECTED Final   Proteus species NOT DETECTED NOT DETECTED Final   Serratia marcescens NOT DETECTED NOT DETECTED Final   Haemophilus influenzae NOT DETECTED NOT DETECTED Final   Neisseria meningitidis NOT DETECTED NOT DETECTED Final   Pseudomonas aeruginosa NOT DETECTED NOT DETECTED Final   Candida albicans NOT DETECTED NOT DETECTED Final   Candida glabrata NOT DETECTED NOT DETECTED Final   Candida krusei NOT DETECTED NOT DETECTED Final   Candida parapsilosis NOT DETECTED NOT DETECTED Final   Candida tropicalis NOT DETECTED NOT DETECTED Final    Comment: Performed at [facility], [address] [place], [place] [address]  MRSA PCR Screening     Status: None   Collection Time: 11/17/18 10:22 PM   Specimen: Nasal Mucosa; Nasopharyngeal  Result Value Ref Range Status   MRSA by PCR  NEGATIVE NEGATIVE Final    Comment:        The GeneXpert MRSA Assay (FDA approved for NASAL specimens only), is one component of a comprehensive MRSA colonization surveillance program. It is not intended to diagnose MRSA infection nor to guide or monitor treatment for MRSA infections. Performed at [facility], [address] [place], [place] [address]      Time coordinating discharge: 35 minutes  SIGNED:   Erick BlinksPratik D Shah, DO [facility] 11/20/2018, 10:46 AM  If 7PM-7AM, please contact night-coverage [email or web address] Password [email or web address]

## 2018-11-21 LAB — HOMOCYSTEINE: Homocysteine: 10.2 umol/L (ref 0.0–14.5)

## 2018-11-22 LAB — CULTURE, BLOOD (ROUTINE X 2)
Culture: NO GROWTH
Special Requests: ADEQUATE

## 2018-12-06 ENCOUNTER — Other Ambulatory Visit: Payer: Self-pay

## 2018-12-07 ENCOUNTER — Ambulatory Visit: Payer: Medicare Other | Admitting: Family Medicine

## 2018-12-12 ENCOUNTER — Ambulatory Visit (INDEPENDENT_AMBULATORY_CARE_PROVIDER_SITE_OTHER): Payer: Medicare Other | Admitting: Family Medicine

## 2018-12-12 ENCOUNTER — Other Ambulatory Visit: Payer: Self-pay

## 2018-12-12 ENCOUNTER — Encounter: Payer: Self-pay | Admitting: Family Medicine

## 2018-12-12 VITALS — BP 150/90 | HR 100 | Temp 97.9°F | Resp 12 | Ht 67.0 in | Wt 162.0 lb

## 2018-12-12 DIAGNOSIS — Z1159 Encounter for screening for other viral diseases: Secondary | ICD-10-CM

## 2018-12-12 DIAGNOSIS — G3109 Other frontotemporal dementia: Secondary | ICD-10-CM

## 2018-12-12 DIAGNOSIS — I10 Essential (primary) hypertension: Secondary | ICD-10-CM

## 2018-12-12 DIAGNOSIS — R296 Repeated falls: Secondary | ICD-10-CM

## 2018-12-12 DIAGNOSIS — E559 Vitamin D deficiency, unspecified: Secondary | ICD-10-CM

## 2018-12-12 DIAGNOSIS — R2681 Unsteadiness on feet: Secondary | ICD-10-CM

## 2018-12-12 DIAGNOSIS — F0280 Dementia in other diseases classified elsewhere without behavioral disturbance: Secondary | ICD-10-CM

## 2018-12-12 MED ORDER — LISINOPRIL 5 MG PO TABS: 5.0000 mg | ORAL_TABLET | Freq: Every day | ORAL | 1 refills | Status: DC

## 2018-12-12 MED ORDER — LORAZEPAM 0.5 MG PO TABS: 0.5000 mg | ORAL_TABLET | Freq: Every day | ORAL | 0 refills | Status: DC | PRN

## 2018-12-12 NOTE — Patient Instructions (Addendum)
    Thank you for coming into the office today. I appreciate the opportunity to provide you with the care for your health and wellness. Today we discussed: establish care  Follow Up: 2 weeks for follow up   Labs today  Referral for neurologist placed today  Low dose Ativan given to help prior to hygiene needs  Please continue to practice social distancing to keep you, your family, and our community safe.  If you must go out, please wear a Mask and practice good handwashing.  [place] YOUR HANDS WELL AND FREQUENTLY. AVOID TOUCHING YOUR FACE, UNLESS YOUR HANDS ARE FRESHLY WASHED.  GET FRESH AIR DAILY. STAY HYDRATED WITH WATER.   It was a pleasure to see you and I look forward to continuing to work together on your health and well-being. Please do not hesitate to call the office if you need care or have questions about your care.  Have a wonderful day and week. With Gratitude, Cherly Beach, DNP, AGNP-BC
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
## 2018-12-13 ENCOUNTER — Encounter: Payer: Self-pay | Admitting: Family Medicine

## 2018-12-13 ENCOUNTER — Other Ambulatory Visit: Payer: Self-pay | Admitting: Family Medicine

## 2018-12-13 DIAGNOSIS — R296 Repeated falls: Secondary | ICD-10-CM | POA: Insufficient documentation

## 2018-12-13 DIAGNOSIS — R2681 Unsteadiness on feet: Secondary | ICD-10-CM | POA: Insufficient documentation

## 2018-12-13 DIAGNOSIS — E559 Vitamin D deficiency, unspecified: Secondary | ICD-10-CM

## 2018-12-13 MED ORDER — VITAMIN D (ERGOCALCIFEROL) 1.25 MG (50000 UNIT) PO CAPS: 50000.0000 [IU] | ORAL_CAPSULE | ORAL | 1 refills | Status: DC

## 2018-12-13 NOTE — Progress Notes (Signed)
Her Vitamin D is extremely low. I will send in a prescription for this. She will only take it once a week make sure they understand this.  I also think starting a B12 supplement might be a good idea as well. Can pick this up OTC.  Any questions just let me know.

## 2018-12-14 ENCOUNTER — Encounter: Payer: Self-pay | Admitting: Family Medicine

## 2018-12-14 NOTE — Progress Notes (Deleted)
   Assessment & Plan:  ***  Follow up plan: No follow-ups on file.  Hendricks Limes, MSN, APRN, FNP-C [facility]  Subjective:   Patient ID: Monica Ayers, female    DOB: [birth year], 58 y.o.   MRN: [id number]  HPI: Monica Ayers is a 58 y.o. female presenting on 12/16/2018 for No chief complaint on file.    ROS: Negative unless specifically indicated above in HPI.   Relevant past medical history reviewed and updated as indicated.   Allergies and medications reviewed and updated.   Current Outpatient Medications:  .  aspirin EC 325 MG EC tablet, Take 1 tablet (325 mg total) by mouth daily., Disp: 30 tablet, Rfl: 3 .  atorvastatin (LIPITOR) 40 MG tablet, Take 1 tablet (40 mg total) by mouth daily at 6 PM., Disp: 30 tablet, Rfl: 3 .  lisinopril (ZESTRIL) 5 MG tablet, Take 1 tablet (5 mg total) by mouth daily., Disp: 30 tablet, Rfl: 1 .  LORazepam (ATIVAN) 0.5 MG tablet, Take 1 tablet (0.5 mg total) by mouth daily as needed for anxiety. Take 30-1 hour prior to needing shower, Disp: 10 tablet, Rfl: 0 .  metFORMIN (GLUCOPHAGE) 500 MG tablet, Take 500 mg by mouth daily. , Disp: , Rfl:  .  Vitamin D, Ergocalciferol, (DRISDOL) 1.25 MG (50000 UT) CAPS capsule, Take 1 capsule (50,000 Units total) by mouth every 7 (seven) days., Disp: 12 capsule, Rfl: 1  Allergies  Allergen Reactions  . Latex Rash    Objective:   There were no vitals taken for this visit.   Physical Exam

## 2018-12-16 ENCOUNTER — Ambulatory Visit: Payer: Medicare Other | Admitting: Family Medicine

## 2018-12-16 LAB — HEP C AB W/REFL
HEPATITIS C ANTIBODY REFILL$(REFL): NONREACTIVE
SIGNAL TO CUT-OFF: 0.03 (ref <1.00)

## 2018-12-16 LAB — REFLEX TIQ

## 2018-12-16 LAB — VITAMIN D 25 HYDROXY (VIT D DEFICIENCY, FRACTURES): Vit D, 25-Hydroxy: 9 ng/mL — ABNORMAL LOW (ref 30–100)

## 2018-12-16 NOTE — Progress Notes (Signed)
Hep C is negative

## 2018-12-19 ENCOUNTER — Telehealth: Payer: Self-pay | Admitting: *Deleted

## 2018-12-19 NOTE — Telephone Encounter (Signed)
Lori with [employer] called about Ms. Vernons wheelchair order. Michela Pitcher it was ordered for lightweight  And her not wanting to push the walker will not qualify her for the wheelchair. She  May need a transport wheelchair instead of light weight wheelchair. She is faxing over the paperwork and I let her know it would be tomorrow as Jarrett Soho was out of the office today

## 2018-12-22 NOTE — Telephone Encounter (Signed)
Spoke with spouse and let him know that there is a liability claim on the insurance and until he calls and clears that up we can't put in a claim for the wheelchair. He verbalized understanding.

## 2018-12-22 NOTE — Telephone Encounter (Signed)
Lori with [employer] called to follow up on the wheelchair paperwork as the patient has reached out to her about it

## 2018-12-28 ENCOUNTER — Ambulatory Visit (INDEPENDENT_AMBULATORY_CARE_PROVIDER_SITE_OTHER): Payer: Medicare Other | Admitting: Family Medicine

## 2018-12-28 ENCOUNTER — Emergency Department (HOSPITAL_COMMUNITY): Payer: Medicare Other

## 2018-12-28 ENCOUNTER — Other Ambulatory Visit: Payer: Self-pay

## 2018-12-28 ENCOUNTER — Encounter: Payer: Self-pay | Admitting: Family Medicine

## 2018-12-28 ENCOUNTER — Encounter (INDEPENDENT_AMBULATORY_CARE_PROVIDER_SITE_OTHER): Payer: Self-pay

## 2018-12-28 ENCOUNTER — Encounter (HOSPITAL_COMMUNITY): Payer: Self-pay

## 2018-12-28 ENCOUNTER — Inpatient Hospital Stay (HOSPITAL_COMMUNITY)
Admission: EM | Admit: 2018-12-28 | Discharge: 2018-12-30 | DRG: 683 | Disposition: A | Discharge status: Home Health | Payer: Medicare Other | Attending: Family Medicine | Admitting: Family Medicine

## 2018-12-28 VITALS — BP 136/80 | HR 150 | Temp 100.7°F | Resp 16

## 2018-12-28 DIAGNOSIS — E559 Vitamin D deficiency, unspecified: Secondary | ICD-10-CM | POA: Diagnosis present

## 2018-12-28 DIAGNOSIS — Z7984 Long term (current) use of oral hypoglycemic drugs: Secondary | ICD-10-CM

## 2018-12-28 DIAGNOSIS — E1165 Type 2 diabetes mellitus with hyperglycemia: Secondary | ICD-10-CM | POA: Diagnosis present

## 2018-12-28 DIAGNOSIS — E872 Acidosis, unspecified: Secondary | ICD-10-CM | POA: Diagnosis present

## 2018-12-28 DIAGNOSIS — R000 Tachycardia, unspecified: Secondary | ICD-10-CM

## 2018-12-28 DIAGNOSIS — R4182 Altered mental status, unspecified: Secondary | ICD-10-CM | POA: Diagnosis not present

## 2018-12-28 DIAGNOSIS — N179 Acute kidney failure, unspecified: Secondary | ICD-10-CM | POA: Diagnosis present

## 2018-12-28 DIAGNOSIS — E869 Volume depletion, unspecified: Secondary | ICD-10-CM | POA: Diagnosis present

## 2018-12-28 DIAGNOSIS — E119 Type 2 diabetes mellitus without complications: Secondary | ICD-10-CM

## 2018-12-28 DIAGNOSIS — Z7982 Long term (current) use of aspirin: Secondary | ICD-10-CM

## 2018-12-28 DIAGNOSIS — A419 Sepsis, unspecified organism: Secondary | ICD-10-CM | POA: Diagnosis not present

## 2018-12-28 DIAGNOSIS — G3109 Other frontotemporal dementia: Secondary | ICD-10-CM | POA: Diagnosis present

## 2018-12-28 DIAGNOSIS — E876 Hypokalemia: Secondary | ICD-10-CM | POA: Diagnosis present

## 2018-12-28 DIAGNOSIS — Z91040 Latex allergy status: Secondary | ICD-10-CM

## 2018-12-28 DIAGNOSIS — I10 Essential (primary) hypertension: Secondary | ICD-10-CM | POA: Diagnosis present

## 2018-12-28 DIAGNOSIS — R6520 Severe sepsis without septic shock: Secondary | ICD-10-CM

## 2018-12-28 DIAGNOSIS — R509 Fever, unspecified: Secondary | ICD-10-CM | POA: Diagnosis not present

## 2018-12-28 DIAGNOSIS — Z87891 Personal history of nicotine dependence: Secondary | ICD-10-CM | POA: Diagnosis not present

## 2018-12-28 DIAGNOSIS — Z20828 Contact with and (suspected) exposure to other viral communicable diseases: Secondary | ICD-10-CM | POA: Diagnosis present

## 2018-12-28 DIAGNOSIS — E870 Hyperosmolality and hypernatremia: Secondary | ICD-10-CM | POA: Diagnosis present

## 2018-12-28 DIAGNOSIS — N390 Urinary tract infection, site not specified: Secondary | ICD-10-CM | POA: Diagnosis present

## 2018-12-28 DIAGNOSIS — R6510 Systemic inflammatory response syndrome (SIRS) of non-infectious origin without acute organ dysfunction: Secondary | ICD-10-CM | POA: Diagnosis present

## 2018-12-28 DIAGNOSIS — F0280 Dementia in other diseases classified elsewhere without behavioral disturbance: Secondary | ICD-10-CM | POA: Diagnosis present

## 2018-12-28 HISTORY — DX: Unspecified dementia, unspecified severity, without behavioral disturbance, psychotic disturbance, mood disturbance, and anxiety: F03.90

## 2018-12-28 HISTORY — DX: Sepsis, unspecified organism: A41.9

## 2018-12-28 LAB — COMPREHENSIVE METABOLIC PANEL
ALT: 30 U/L (ref 0–44)
AST: 38 U/L (ref 15–41)
Albumin: 4.4 g/dL (ref 3.5–5.0)
Alkaline Phosphatase: 64 U/L (ref 38–126)
Anion gap: 16 — ABNORMAL HIGH (ref 5–15)
BUN: 18 mg/dL (ref 6–20)
CO2: 23 mmol/L (ref 22–32)
Calcium: 9 mg/dL (ref 8.9–10.3)
Chloride: 109 mmol/L (ref 98–111)
Creatinine, Ser: 1.01 mg/dL — ABNORMAL HIGH (ref 0.44–1.00)
GFR calc Af Amer: >60 mL/min (ref >60)
GFR calc non Af Amer: >60 mL/min (ref >60)
Glucose, Bld: 238 mg/dL — ABNORMAL HIGH (ref 70–99)
Potassium: 2.3 mmol/L — CL (ref 3.5–5.1)
Sodium: 148 mmol/L — ABNORMAL HIGH (ref 135–145)
Total Bilirubin: 1.3 mg/dL — ABNORMAL HIGH (ref 0.3–1.2)
Total Protein: 7.8 g/dL (ref 6.5–8.1)

## 2018-12-28 LAB — PROTIME-INR
INR: 1.2 (ref 0.8–1.2)
Prothrombin Time: 15.1 seconds (ref 11.4–15.2)

## 2018-12-28 LAB — URINALYSIS, ROUTINE W REFLEX MICROSCOPIC
Bilirubin Urine: NEGATIVE
Glucose, UA: NEGATIVE mg/dL
Hgb urine dipstick: NEGATIVE
Ketones, ur: NEGATIVE mg/dL
Leukocytes,Ua: NEGATIVE
Nitrite: NEGATIVE
Protein, ur: NEGATIVE mg/dL
Specific Gravity, Urine: 1.011 (ref 1.005–1.030)
pH: 5 (ref 5.0–8.0)

## 2018-12-28 LAB — CBC WITH DIFFERENTIAL/PLATELET
Abs Immature Granulocytes: 0.02 10*3/uL (ref 0.00–0.07)
Basophils Absolute: 0.1 10*3/uL (ref 0.0–0.1)
Basophils Relative: 0 %
Eosinophils Absolute: 0 10*3/uL (ref 0.0–0.5)
Eosinophils Relative: 0 %
HCT: 45.9 % (ref 36.0–46.0)
Hemoglobin: 15.1 g/dL — ABNORMAL HIGH (ref 12.0–15.0)
Immature Granulocytes: 0 %
Lymphocytes Relative: 10 %
Lymphs Abs: 1.2 10*3/uL (ref 0.7–4.0)
MCH: 28.8 pg (ref 26.0–34.0)
MCHC: 32.9 g/dL (ref 30.0–36.0)
MCV: 87.4 fL (ref 80.0–100.0)
Monocytes Absolute: 0.7 10*3/uL (ref 0.1–1.0)
Monocytes Relative: 6 %
Neutro Abs: 9.8 10*3/uL — ABNORMAL HIGH (ref 1.7–7.7)
Neutrophils Relative %: 84 %
Platelets: 358 10*3/uL (ref 150–400)
RBC: 5.25 MIL/uL — ABNORMAL HIGH (ref 3.87–5.11)
RDW: 14.6 % (ref 11.5–15.5)
WBC: 11.8 10*3/uL — ABNORMAL HIGH (ref 4.0–10.5)
nRBC: 0 % (ref 0.0–0.2)

## 2018-12-28 LAB — LACTIC ACID, PLASMA
Lactic Acid, Venous: 5 mmol/L (ref 0.5–1.9)
Lactic Acid, Venous: 1.6 mmol/L (ref 0.5–1.9)

## 2018-12-28 LAB — D-DIMER, QUANTITATIVE: D-Dimer, Quant: 0.55 ug/mL-FEU — ABNORMAL HIGH (ref 0.00–0.50)

## 2018-12-28 MED ORDER — SODIUM CHLORIDE 0.9 % IV BOLUS
1000.0000 mL | Freq: Once | INTRAVENOUS | Status: AC
  Administered 2018-12-28: 15:00:00 1000 mL via INTRAVENOUS

## 2018-12-28 MED ORDER — SODIUM CHLORIDE 0.9 % IV SOLN
1.0000 g | Freq: Once | INTRAVENOUS | Status: AC
  Administered 2018-12-28: 1 g via INTRAVENOUS
  Filled 2018-12-28: qty 10

## 2018-12-28 MED ORDER — SODIUM CHLORIDE 0.9 % IV SOLN
2.0000 g | Freq: Three times a day (TID) | INTRAVENOUS | Status: DC
  Administered 2018-12-28 – 2018-12-30 (×5): 2 g via INTRAVENOUS
  Filled 2018-12-28 (×5): qty 2

## 2018-12-28 MED ORDER — VITAMIN D (ERGOCALCIFEROL) 1.25 MG (50000 UNIT) PO CAPS: 50000.0000 [IU] | ORAL_CAPSULE | ORAL | Status: DC

## 2018-12-28 MED ORDER — ACETAMINOPHEN 325 MG PO TABS: 650.0000 mg | ORAL_TABLET | Freq: Four times a day (QID) | ORAL | Status: DC | PRN

## 2018-12-28 MED ORDER — SODIUM CHLORIDE 0.9 % IV BOLUS (SEPSIS)
1000.0000 mL | Freq: Once | INTRAVENOUS | Status: AC
  Administered 2018-12-28: 1000 mL via INTRAVENOUS

## 2018-12-28 MED ORDER — ACETAMINOPHEN 325 MG PO TABS
650.0000 mg | ORAL_TABLET | Freq: Once | ORAL | Status: DC
  Filled 2018-12-28: qty 2

## 2018-12-28 MED ORDER — VANCOMYCIN HCL IN DEXTROSE 1-5 GM/200ML-% IV SOLN: 1000.0000 mg | Freq: Two times a day (BID) | INTRAVENOUS | Status: DC

## 2018-12-28 MED ORDER — HEPARIN SODIUM (PORCINE) 5000 UNIT/ML IJ SOLN
5000.0000 [IU] | Freq: Three times a day (TID) | INTRAMUSCULAR | Status: DC
  Administered 2018-12-29 – 2018-12-30 (×4): 5000 [IU] via SUBCUTANEOUS
  Filled 2018-12-28 (×4): qty 1

## 2018-12-28 MED ORDER — SODIUM CHLORIDE 0.9 % IV SOLN: 2.0000 g | Freq: Once | INTRAVENOUS | Status: DC

## 2018-12-28 MED ORDER — ASPIRIN EC 325 MG PO TBEC
325.0000 mg | DELAYED_RELEASE_TABLET | Freq: Every day | ORAL | Status: DC
  Administered 2018-12-29 – 2018-12-30 (×2): 325 mg via ORAL
  Filled 2018-12-28 (×2): qty 1

## 2018-12-28 MED ORDER — DOCUSATE SODIUM 100 MG PO CAPS
100.0000 mg | ORAL_CAPSULE | Freq: Two times a day (BID) | ORAL | Status: DC
  Administered 2018-12-29 – 2018-12-30 (×4): 100 mg via ORAL
  Filled 2018-12-28 (×4): qty 1

## 2018-12-28 MED ORDER — VANCOMYCIN HCL IN DEXTROSE 1-5 GM/200ML-% IV SOLN
1000.0000 mg | INTRAVENOUS | Status: AC
  Administered 2018-12-28 – 2018-12-29 (×2): 1000 mg via INTRAVENOUS
  Filled 2018-12-28 (×2): qty 200

## 2018-12-28 MED ORDER — ATORVASTATIN CALCIUM 40 MG PO TABS
40.0000 mg | ORAL_TABLET | Freq: Every day | ORAL | Status: DC
  Administered 2018-12-29: 17:00:00 40 mg via ORAL
  Filled 2018-12-28: qty 1

## 2018-12-28 MED ORDER — CHLORHEXIDINE GLUCONATE CLOTH 2 % EX PADS
6.0000 | MEDICATED_PAD | Freq: Every day | CUTANEOUS | Status: DC
  Administered 2018-12-29 – 2018-12-30 (×2): 6 via TOPICAL

## 2018-12-28 MED ORDER — TRAMADOL HCL 50 MG PO TABS
50.0000 mg | ORAL_TABLET | Freq: Four times a day (QID) | ORAL | Status: DC | PRN
  Administered 2018-12-29 – 2018-12-30 (×4): 50 mg via ORAL
  Filled 2018-12-28 (×4): qty 1

## 2018-12-28 MED ORDER — ACETAMINOPHEN 650 MG RE SUPP: 650.0000 mg | Freq: Four times a day (QID) | RECTAL | Status: DC | PRN

## 2018-12-28 MED ORDER — VANCOMYCIN HCL IN DEXTROSE 1-5 GM/200ML-% IV SOLN: 1000.0000 mg | Freq: Once | INTRAVENOUS | Status: DC

## 2018-12-28 MED ORDER — LORAZEPAM 0.5 MG PO TABS
0.5000 mg | ORAL_TABLET | Freq: Every day | ORAL | Status: DC | PRN
  Administered 2018-12-29 (×2): 0.5 mg via ORAL
  Filled 2018-12-28 (×2): qty 1

## 2018-12-28 MED ORDER — POTASSIUM CHLORIDE 10 MEQ/100ML IV SOLN
10.0000 meq | INTRAVENOUS | Status: AC
  Administered 2018-12-29 (×5): 10 meq via INTRAVENOUS
  Filled 2018-12-28 (×5): qty 100

## 2018-12-28 MED ORDER — POTASSIUM CHLORIDE 10 MEQ/100ML IV SOLN
10.0000 meq | Freq: Once | INTRAVENOUS | Status: AC
  Administered 2018-12-28: 10 meq via INTRAVENOUS
  Filled 2018-12-28: qty 100

## 2018-12-28 MED ORDER — ACETAMINOPHEN 10 MG/ML IV SOLN
1000.0000 mg | Freq: Once | INTRAVENOUS | Status: AC
  Filled 2018-12-28: qty 100

## 2018-12-28 MED ORDER — INSULIN ASPART 100 UNIT/ML ~~LOC~~ SOLN
0.0000 [IU] | Freq: Three times a day (TID) | SUBCUTANEOUS | Status: DC
  Administered 2018-12-29 (×2): 1 [IU] via SUBCUTANEOUS
  Administered 2018-12-29 – 2018-12-30 (×3): 2 [IU] via SUBCUTANEOUS

## 2018-12-28 MED ORDER — MAGNESIUM SULFATE IN D5W 1-5 GM/100ML-% IV SOLN
1.0000 g | Freq: Once | INTRAVENOUS | Status: AC
  Administered 2018-12-28: 1 g via INTRAVENOUS
  Filled 2018-12-28: qty 100

## 2018-12-28 NOTE — ED Notes (Signed)
Date and time results received: 12/28/18  1538   Test: Potassium  Critical Value: 2.3  Name of Provider Notified:Venter, Margaux, PA-C

## 2018-12-28 NOTE — Patient Instructions (Signed)
    Thank you for coming into the office today. I appreciate the opportunity to provide you with the care for your health and wellness. Today we discussed: dementia, elevated heart rate and temp  Follow Up: 2 weeks  Today you were sent to the ED for further eval of possible infection. I will follow the chart and pending findings we will set the next appt. I do so hope Mrs Wos feels better.  Please continue to practice social distancing to keep you, your family, and our community safe.  If you must go out, please wear a Mask and practice good handwashing.  [place] YOUR HANDS WELL AND FREQUENTLY. AVOID TOUCHING YOUR FACE, UNLESS YOUR HANDS ARE FRESHLY WASHED.  GET FRESH AIR DAILY. STAY HYDRATED WITH WATER.   It was a pleasure to see you and I look forward to continuing to work together on your health and well-being. Please do not hesitate to call the office if you need care or have questions about your care.  Have a wonderful day and week.  With Gratitude,  Cherly Beach, DNP, AGNP-BC

## 2018-12-28 NOTE — Progress Notes (Signed)
Pharmacy Antibiotic Note  Monica Ayers is a 58 y.o. female admitted on 12/28/2018 with sepsis.  Pharmacy has been consulted for vancomycin and cefepime dosing.  Plan: Vancomycin 1000mg  IV every 12 hours.  Goal trough 15-20 mcg/mL. cefepime 2gm iv q8h  Height: 5\' 7"  (170.2 cm) Weight: 188 lb (85.3 kg) IBW/kg (Calculated) : 61.6  Temp (24hrs), Avg:99.7 F (37.6 C), Min:98.8 F (37.1 C), Max:100.7 F (38.2 C)  Recent Labs  Lab 12/28/18 1453 12/28/18 1800  WBC 11.8*  --   CREATININE 1.01*  --   LATICACIDVEN 5.0* 1.6    Estimated Creatinine Clearance: 68.1 mL/min (A) (by C-G formula based on SCr of 1.01 mg/dL (H)).    Allergies  Allergen Reactions  . Latex Rash    Antimicrobials this admission: 8/26 cefepime >>  8/26 vancomycin >>  Microbiology results: 8/26 BCx: sent 8/26 UCx: sent   Thank you for allowing pharmacy to be a part of this patient's care.  Donna Christen Zyere Jiminez 12/28/2018 10:31 PM

## 2018-12-28 NOTE — ED Provider Notes (Addendum)
[facility] Provider Note   CSN: [id number] Arrival date & time: 12/28/18  1427     History   Chief Complaint Chief Complaint  Patient presents with  . Altered Mental Status   LEVEL 5 CAVEAT - HX OF DEMENTIA; WORSENING AMS; PT NONVERBAL MOSTLY   HPI Monica MassonRochelle M [facility]yers is a 58 y.o. female with PMHx dementia, diabetes, HTN, who presents to the ED today for complaint of fever and altered mental status.  Patient was just recently diagnosed with early onset dementia.  She had an office visit with family medicine today to reevaluate her blood pressure.  While at the office she was noted to have a temp of 100.7 and was tachycardic in the 140s.  Husband told provider at the time that patient was having some malodorous urine.  Was sent to the ED for further evaluation.  Husband reports that while ambulating patient from be office to the car he noted her to more on the right side which she does not typically do.  He states that she is typically nonverbal but is not acting at her usual baseline since being diagnosed with dementia.  She shakes her head no when asked if she has pain anywhere.       Past Medical History:  Diagnosis Date  . Allergy   . Dementia (HCC)   . Diabetes mellitus without complication (HCC)   . Hypertension     Patient Active Problem List   Diagnosis Date Noted  . Unsteady gait 12/13/2018  . Falls frequently 12/13/2018  . Pressure injury of skin 11/18/2018  . Transaminasemia 11/18/2018  . Sepsis due to undetermined organism (HCC) 11/17/2018  . Lactic acidosis 11/17/2018  . Type 2 diabetes mellitus (HCC) 11/17/2018  . 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 Essential hypertension 10/02/2016  . Allergic state 10/02/2016    Past Surgical History:  Procedure Laterality Date  . FRACTURE SURGERY    . KNEE SURGERY Right   . NECK SURGERY    . SHOULDER SURGERY Left      OB History   No obstetric history on file.      Home Medications     Prior to Admission medications   Medication Sig Start Date End Date Taking? Authorizing Provider  aspirin EC 325 MG EC tablet Take 1 tablet (325 mg total) by mouth daily. 11/21/18  Yes Shah, Pratik D, DO  atorvastatin (LIPITOR) 40 MG tablet Take 1 tablet (40 mg total) by mouth daily at 6 PM. 11/20/18 12/28/18 Yes Shah, Pratik D, DO  lisinopril (ZESTRIL) 5 MG tablet Take 1 tablet (5 mg total) by mouth daily. 12/12/18  Yes Freddy FinnerMills, Hannah M, NP  LORazepam (ATIVAN) 0.5 MG tablet Take 1 tablet (0.5 mg total) by mouth daily as needed for anxiety. Take 30-1 hour prior to needing shower 12/12/18  Yes Freddy FinnerMills, Hannah M, NP  metFORMIN (GLUCOPHAGE) 500 MG tablet Take 500 mg by mouth daily.  09/30/18 09/30/19 Yes [provider]  Vitamin D, Ergocalciferol, (DRISDOL) 1.25 MG (50000 UT) CAPS capsule Take 1 capsule (50,000 Units total) by mouth every 7 (seven) days. 12/13/18  Yes Freddy FinnerMills, Hannah M, NP    Family History No family history on file.  Social History Social History   Tobacco Use  . Smoking status: Former Smoker    Types: Cigarettes  . Smokeless tobacco: Never Used  Substance Use Topics  . Alcohol use: No  . Drug use: No     Allergies   Latex  Review of Systems Review of Systems  Unable to perform ROS: Mental status change  Constitutional: Positive for fever.  Genitourinary:       + foul smelling urine     Physical Exam Updated Vital Signs BP (!) 145/88 (BP Location: Right Arm)   Pulse (!) 129   Temp 99.6 F (37.6 C) (Oral)   Resp (!) 31   Ht 5\' 7"  (1.702 m)   Wt 85.3 kg   BMI 29.44 kg/m   Physical Exam Vitals signs and nursing note reviewed.  Constitutional:      Appearance: She is not ill-appearing.  HENT:     Head: Normocephalic and atraumatic.  Eyes:     Conjunctiva/sclera: Conjunctivae normal.  Neck:     Musculoskeletal: Neck supple.  Cardiovascular:     Rate and Rhythm: Regular rhythm. Tachycardia present.  Pulmonary:     Effort: Pulmonary effort is  normal.     Breath sounds: Normal breath sounds. No wheezing, rhonchi or rales.  Abdominal:     Palpations: Abdomen is soft.     Tenderness: There is no abdominal tenderness. There is no guarding or rebound.  Skin:    General: Skin is warm and dry.  Neurological:     Mental Status: She is alert.     Comments: Patient nonverbal.  She can shake her head yes and no.  Shakes her head no when asked if she has any pain anywhere.  Unable to assess neuro exam fully.  She is tracking her eye movements appropriately.  Pupils equal round and reactive to light.  She is able to move all extremities without difficulty.       ED Treatments / Results  Labs (all labs ordered are listed, but only abnormal results are displayed) Labs Reviewed  COMPREHENSIVE METABOLIC PANEL - Abnormal; Notable for the following components:      Result Value   Sodium 148 (*)    Potassium 2.3 (*)    Glucose, Bld 238 (*)    Creatinine, Ser 1.01 (*)    Total Bilirubin 1.3 (*)    Anion gap 16 (*)    All other components within normal limits  LACTIC ACID, PLASMA - Abnormal; Notable for the following components:   Lactic Acid, Venous 5.0 (*)    All other components within normal limits  CBC WITH DIFFERENTIAL/PLATELET - Abnormal; Notable for the following components:   WBC 11.8 (*)    RBC 5.25 (*)    Hemoglobin 15.1 (*)    Neutro Abs 9.8 (*)    All other components within normal limits  D-DIMER, QUANTITATIVE (NOT AT [facility]) - Abnormal; Notable for the following components:   D-Dimer, Quant 0.55 (*)    All other components within normal limits  CULTURE, BLOOD (ROUTINE X 2)  CULTURE, BLOOD (ROUTINE X 2)  URINE CULTURE  SARS CORONAVIRUS 2 (TAT 6-12 HRS)  PROTIME-INR  LACTIC ACID, PLASMA  URINALYSIS, ROUTINE W REFLEX MICROSCOPIC    EKG EKG Interpretation  Date/Time:  Wednesday December 28 2018 14:41:24 EDT Ventricular Rate:  125 PR Interval:    QRS Duration: 79 QT Interval:  362 QTC Calculation: 523 R Axis:   25  Text Interpretation:  Sinus tachycardia LAE, consider biatrial enlargement Borderline ST depression, diffuse leads Prolonged QT interval Confirmed by Nat Christen [phone]) on 12/28/2018 4:46:39 PM   Radiology No results found.  Procedures .Critical Care Performed by: Eustaquio Maize, PA-C Authorized by: Eustaquio Maize, PA-C   Critical care provider statement:  Critical care time (minutes):  45   Critical care was necessary to treat or prevent imminent or life-threatening deterioration of the following conditions:  Sepsis   Critical care was time spent personally by me on the following activities:  Discussions with consultants, evaluation of patient's response to treatment, examination of patient, ordering and performing treatments and interventions, ordering and review of laboratory studies, ordering and review of radiographic studies, pulse oximetry, re-evaluation of patient's condition, obtaining history from patient or surrogate and review of old charts   (including critical care time)  Medications Ordered in ED Medications  acetaminophen (OFIRMEV) IV 1,000 mg (has no administration in time range)  sodium chloride 0.9 % bolus 1,000 mL (1,000 mLs Intravenous New Bag/Given 12/28/18 1608)    And  sodium chloride 0.9 % bolus 1,000 mL (1,000 mLs Intravenous New Bag/Given 12/28/18 1646)  sodium chloride 0.9 % bolus 1,000 mL (0 mLs Intravenous Stopped 12/28/18 1554)  cefTRIAXone (ROCEPHIN) 1 g in sodium chloride 0.9 % 100 mL IVPB (0 g Intravenous Stopped 12/28/18 1554)  potassium chloride 10 mEq in 100 mL IVPB (10 mEq Intravenous New Bag/Given 12/28/18 1553)  magnesium sulfate IVPB 1 g 100 mL (1 g Intravenous New Bag/Given 12/28/18 1551)     Initial Impression / Assessment and Plan / ED Course  I have reviewed the triage vital signs and the nursing notes.  Pertinent labs & imaging results that were available during my care of the patient were reviewed by me and considered in my medical  decision making (see chart for details).  Clinical Course as of Dec 28 1710  Wed Dec 28, 2018  1522 Per age adjustment this is within normal limits  D-Dimer, Quant(!): 0.55 [MV]  1600 Lactic Acid, Venous(!!): 5.0 [MV]  1600 Potassium(!!): 2.3 [MV]  1600 WBC(!): 11.8 [MV]    Clinical Course User Index [MV] Tanda RockersVenter, Mosiah Bastin, PA-C   58 year old female with newly diagnosed dementia who presents with fever, tachycardia, foul-smelling urine found at PCPs office today.  Unable to obtain much history as patient is mostly nonverbal with new onset dementia.  Husband states that he has noticed foul-smelling urine at home.  He also states that when ambulating her today patient began leaning more to the right side which she does not typically do.  He states that she is not acting at baseline currently.  Will obtain CT head at this time.  Patient afebrile in the ED with tachycardia and tachypnea.  She currently meets sepsis criteria with suspected source of infection urine.  Will obtain blood cultures and start sepsis work-up today.  Ceftriaxone initiated.  Will reevaluate once labs and imaging return.  Patient will likely need to be admitted.   Leukocytosis of 11,000.  Patient already started on Rocephin for likely UTI.  Calcium of 2.3.  Have repleted in the ED as well as given magnesium.  Creatinine mildly elevated at 1.01.  Patient already receiving 30 cc/kg IV fluids given concern for sepsis.  Lactic acid 5.0.  D-dimer slightly elevated at 0.55 although with age correction this is within normal limits.  Very low suspicion for PE.   Still awaiting chest x-ray, CT head, urinalysis.  Once all of these are processed patient can be admitted for IV antibiotics.   5:11 PM At shift change case signed out to Ivery QualeHobson Bryant, PA-C, who will admit patient after imaging returns.        Final Clinical Impressions(s) / ED Diagnoses   Final diagnoses:  Sepsis without acute organ dysfunction,  due to unspecified  organism [facility])  Hypokalemia    ED Discharge Orders    None       Tanda Rockers, PA-C 12/28/18 1712    Long, Arlyss Repress, MD 12/28/18 Maurie Boettcher, MD 12/28/18 1827    Tanda Rockers, PA-C 01/08/19 1026    Long, Arlyss Repress, MD 01/11/19 [phone]

## 2018-12-28 NOTE — ED Notes (Signed)
Patient transported to CT 

## 2018-12-28 NOTE — Sepsis Progress Note (Signed)
Notified bedside nurse of need to draw repeat lactic acid after completing the 3 liter fluid resusitation.

## 2018-12-28 NOTE — ED Triage Notes (Addendum)
Husband brought pt over from Dr Sabra Heck office. Report called stating temp 100.7, pulse 145 and foul smelling urine. Husband says pt was normal last night. Pt was able to ambulate to chair. Husband states that she was leaning so bad when he was trying to get her in car. Pt nods head appropriately when asked questions, but not answer. Pt noted to be leaning to left side

## 2018-12-28 NOTE — Sepsis Progress Note (Signed)
Notified provider of need to document exam of volume status / Repeat Sepsis Assessment note.

## 2018-12-28 NOTE — ED Notes (Signed)
Date and time results received: 12/28/18 1558   Test: Lactic Acid  Critical Value: 5.0  Name of Provider Notified: Eustaquio Maize, PA-C  Orders Received, see [facility]

## 2018-12-28 NOTE — Progress Notes (Signed)
Subjective:     Patient ID: Monica [facility], female   DOB: [birth year], 58 y.o.   MRN: [id number]  Monica [facility] presents for frontal lobe dementia (2 week follow up) and Hypertension Husband and sister in law provide history secondary to dementia.  She is here today to follow-up on the start of lisinopril for an elevated BP that she had at her previous visit.  Did not want to drastically drop her secondary to her current dementia with a fall risk and her inability to talk if she was dizzy or not.  Blood pressure today appears stable.  Husband reports not had any trouble taking the medication.  Have started her on Ativan and referred her to neurology.  Reports of Ativan not working at all to help with hygienic needs.  They recently saw neurology earlier this week have not received any records from that visit at this time.  Husband and sister-in-law report that this is not a good day.  She has been struggling to stop anything that they have tried to get her to do.  Secondary to coming into this office she was very resistant.  She is currently slumped over in her walker not wanting to participate in the visit as much as she did at the previous visit.  When asked if she is not feeling well she shakes her head in agreements.  But is unable to verbalize what is bothering her.  Husband reports that her urine has been very malodorous here recently.  Additionally she is unable to know when to use the restroom even though he tries to get her to the toilet.  Reports that she uses it in her pants and in the bed a lot.  Reports that she is very refusal on hygiene.  Her appetite is also decreased as well.  Still nonverbal in her communication most of the time.  Has started to become a little bit more aggressive when they are trying to get her to do something that she needs to have done such as going to the shower getting into the car.  Today patient denies signs and symptoms of COVID 19 infection including  fever, chills, cough, shortness of breath, and headache.  Past Medical, Surgical, Social History, Allergies, and Medications have been Reviewed.   Past Medical History:  Diagnosis Date  . Allergy   . Diabetes mellitus without complication ([place])   . Hypertension    Past Surgical History:  Procedure Laterality Date  . FRACTURE SURGERY    . KNEE SURGERY Right   . NECK SURGERY    . SHOULDER SURGERY Left    Social History   Socioeconomic History  . Marital status: Married    Spouse name: Not on file  . Number of children: Not on file  . Years of education: Not on file  . Highest education level: Not on file  Occupational History  . Not on file  Social Needs  . Financial resource strain: Not on file  . Food insecurity    Worry: Not on file    Inability: Not on file  . Transportation needs    Medical: Not on file    Non-medical: Not on file  Tobacco Use  . Smoking status: Former Smoker    Types: Cigarettes  . Smokeless tobacco: Never Used  Substance and Sexual Activity  . Alcohol use: No  . Drug use: No  . Sexual activity: Not on file  Lifestyle  . Physical activity  Days per week: Not on file    Minutes per session: Not on file  . Stress: Not on file  Relationships  . Social Musician on phone: Not on file    Gets together: Not on file    Attends religious service: Not on file    Active member of club or organization: Not on file    Attends meetings of clubs or organizations: Not on file    Relationship status: Not on file  . Intimate partner violence    Fear of current or ex partner: Not on file    Emotionally abused: Not on file    Physically abused: Not on file    Forced sexual activity: Not on file  Other Topics Concern  . Not on file  Social History Narrative  . Not on file    Outpatient Encounter Medications as of 12/28/2018  Medication Sig  . aspirin EC 325 MG EC tablet Take 1 tablet (325 mg total) by mouth daily.  Marland Kitchen atorvastatin  (LIPITOR) 40 MG tablet Take 1 tablet (40 mg total) by mouth daily at 6 PM.  . lisinopril (ZESTRIL) 5 MG tablet Take 1 tablet (5 mg total) by mouth daily.  Marland Kitchen LORazepam (ATIVAN) 0.5 MG tablet Take 1 tablet (0.5 mg total) by mouth daily as needed for anxiety. Take 30-1 hour prior to needing shower  . metFORMIN (GLUCOPHAGE) 500 MG tablet Take 500 mg by mouth daily.   . Vitamin D, Ergocalciferol, (DRISDOL) 1.25 MG (50000 UT) CAPS capsule Take 1 capsule (50,000 Units total) by mouth every 7 (seven) days.   No facility-administered encounter medications on file as of 12/28/2018.    Allergies  Allergen Reactions  . Latex Rash    Review of Systems  Constitutional: Positive for activity change, appetite change and fever. Negative for chills.  HENT: Negative.   Eyes: Negative.   Respiratory: Negative.  Negative for cough and shortness of breath.   Cardiovascular: Negative.  Negative for chest pain, palpitations and leg swelling.  Gastrointestinal: Negative.   Endocrine: Negative.   Genitourinary: Negative.        Odorous per husband   Musculoskeletal: Negative.   Skin: Negative.   Allergic/Immunologic: Negative.   Neurological: Negative.   Hematological: Negative.   Psychiatric/Behavioral: Positive for agitation and behavioral problems.  All other systems reviewed and are negative.      Objective:     BP 136/80   Pulse (!) 150   Temp (!) 100.7 F (38.2 C) (Oral)   Resp 16   SpO2 96%   Physical Exam Vitals signs and nursing note reviewed.  Constitutional:      Appearance: Normal appearance. She is well-developed and well-groomed. She is obese.  HENT:     Head: Normocephalic and atraumatic.     Right Ear: External ear normal.     Left Ear: External ear normal.     Nose: Nose normal.     Mouth/Throat:     Mouth: Mucous membranes are moist.     Pharynx: Oropharynx is clear.  Eyes:     General:        Right eye: No discharge.        Left eye: No discharge.      Conjunctiva/sclera: Conjunctivae normal.  Neck:     Musculoskeletal: Normal range of motion and neck supple.  Cardiovascular:     Rate and Rhythm: Regular rhythm. Tachycardia present.     Pulses: Normal pulses.  Heart sounds: Normal heart sounds.  Pulmonary:     Effort: Pulmonary effort is normal.     Comments: Diminished BS, but pt is not able to follow taking deep breath commands Musculoskeletal: Normal range of motion.  Skin:    General: Skin is warm.  Neurological:     General: No focal deficit present.     Mental Status: She is alert and oriented to person, place, and time.  Psychiatric:        Attention and Perception: Attention normal.        Mood and Affect: Mood normal.        Speech: Speech normal.        Behavior: Behavior normal. Behavior is cooperative.        Thought Content: Thought content normal.        Cognition and Memory: Cognition normal.        Judgment: Judgment normal.   EKG:  12/28/2018 Sinus Tachycardia 152 BPM, ST & T wave abnormality, consider lateral ischemia: Abnormal EKG     Assessment and Plan        1. Frontal lobe dementia (HCC) Pending findings at ED, I will consider starting trazodone low dose at bedtime. Behavior issues are a concern and she might need placement in to a SNF  As husband is having a hard time getting her daily care completed without issues.  Might have progression increased due to infection of currently unknown cause. I will await ED findings.  2. Sinus tachycardia by electrocardiogram Pulse ox, manual count and EKG all demonstrated ST in 140-150's range. Even after allow her to sit and rest for a bit. Due to this and fever and possible UTI,  I have suggested she go to the ED to further evaluation.  3. Essential hypertension Controlled/Improved, continue current medication regimen. No refills needed.  4. Fever, unspecified fever cause 100.7 temp, recheck 99.5 axillary most likely 100.5-100.7 range if not higher.  Inability to keep temp probe in mouth so I did axillary second time around. Sent to ED for further eval   Follow Up: 2 weeks- pending ED eval  Monica FinnerHannah M. Marshal Schrecengost, DNP, AGNP-BC [facility] [address], [address] [place], [place] [address] Office Hours: Mon-Thurs 8 am-5 pm; Fri 8 am-12 pm Office Phone:  [phone]  Office Fax: [phone]

## 2018-12-28 NOTE — H&P (Signed)
TRH H&P    Patient Demographics:    Monica Ayers, is a 58 y.o. female  MRN: [id number]  DOB - 1961/01/25  Admit Date - 12/28/2018  Referring MD/NP/PA: PA Beverely Pace  Outpatient Primary MD for the patient is Freddy Finner, NP  Patient coming from: Primary care physician's office  Chief complaint-sepsis vitals   HPI:    Monica Ayers  is a 58 y.o. female, with history significant for type 2 diabetes, hypertension, dementia, vitamin D deficiency, and seasonal allergies.  Patient reports to the ER from primary care physician's office.  Patient reportedly had a temperature of 100.7, and heart rate of 145.  Husband had reported malodorous urine as well, so patient was sent to the ER.  Patient is not able to give any history at all during my exam.  Apparently she was nonverbal when she arrived.  Is alert and oriented x0, and patient's husband reports that this is her baseline.  So history is taken from husband, medical chart, and ER staff.  Patient was recently admitted last month and the month before for progressively worsening mental status.  At last admission her complaint was anorexia and fatigue.  She was admitted at that time for sepsis due to undetermined organism.  She was started on broad-spectrum antibiotics.  She did have an MRI that demonstrated acute, deep white matter CVAs at that time.  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  At that time she was seen by neurology who recommended EEG in the outpatient setting, as well as continued full dose aspirin and statin therapy.  Patient was referred to neurology for an outpatient visit but it does not appear that she has been able to follow-up with them at this point.  ED course:  Patient had chemistry profile done that revealed hypernatremia at 148, severe hypokalemia of 2.3, glucose of 238, and AKI with a creatinine  of 1.01 up from 0.5 baseline, anion gap of 16, elevated total bilirubin at 1.3, lactic acid of 5.0 and repeat 1.6.  Hematology revealed a leukocytosis with white blood cell 11.8, elevated hemoglobin at 15.1, platelets of 358.  D-dimer was 0.55, INR 1.2.  Glucose showed hyperglycemia at 238, last hemoglobin A1c was November 17, 2018 and was 6.7.  Last TSH was 0.427 on the same day as a hemoglobin A1c.  COVID test in process.  Urine was not suspicious for UTI -urine culture ordered.  Patient had CT scan of head that showed 1 no acute intracranial findings by CT follow-up studies with MRI as clinically warranted.  2 extensive chronic microvascular ischemic change and cerebral volume, similar to prior.  And 3 left maxillary sinusitis.  Chest x-ray shows no active disease.  EKG shows sinus tachycardia 125, and prolonged QT at 523.  I personally called and spoke to husband to try to elicit more history.  He reports no abnormal observations with patient.  This includes no diarrhea, no vomiting, he reports no skin rashes, no cough, no complaints of earache.  The  only thing that he has noticed is the malodorous urine.  He reports no fever at home.    Review of systems:    In addition to the HPI above, further review of systems cannot be obtained due to patient's mental status.   All other systems reviewed and are negative.    Past History of the following :    Past Medical History:  Diagnosis Date   Allergy    Dementia ([place])    Diabetes mellitus without complication ([place])    Hypertension       Past Surgical History:  Procedure Laterality Date   FRACTURE SURGERY     KNEE SURGERY Right    NECK SURGERY     SHOULDER SURGERY Left       Social History:      Social History   Tobacco Use   Smoking status: Former Smoker    Types: Cigarettes   Smokeless tobacco: Never Used  Substance Use Topics   Alcohol use: No       Family History :    No family history on file.    Home  Medications:   Prior to Admission medications   Medication Sig Start Date End Date Taking? Authorizing Provider  aspirin EC 325 MG EC tablet Take 1 tablet (325 mg total) by mouth daily. 11/21/18  Yes Shah, Pratik D, DO  atorvastatin (LIPITOR) 40 MG tablet Take 1 tablet (40 mg total) by mouth daily at 6 PM. 11/20/18 12/28/18 Yes Shah, Pratik D, DO  lisinopril (ZESTRIL) 5 MG tablet Take 1 tablet (5 mg total) by mouth daily. 12/12/18  Yes Perlie Mayo, NP  metFORMIN (GLUCOPHAGE) 500 MG tablet Take 500 mg by mouth daily.  09/30/18 09/30/19 Yes [provider]  LORazepam (ATIVAN) 0.5 MG tablet Take 1 tablet (0.5 mg total) by mouth daily as needed for anxiety. Take 30-1 hour prior to needing shower 12/12/18   Perlie Mayo, NP  Vitamin D, Ergocalciferol, (DRISDOL) 1.25 MG (50000 UT) CAPS capsule Take 1 capsule (50,000 Units total) by mouth every 7 (seven) days. 12/13/18   Perlie Mayo, NP     Allergies:     Allergies  Allergen Reactions   Latex Rash     Physical Exam:   Vitals  Blood pressure (!) 153/87, pulse 95, temperature 98.8 F (37.1 C), temperature source Oral, resp. rate 18, height 5\' 7"  (1.702 m), weight 85.3 kg, SpO2 99 %.  1.  General: Lying supine in bed anxiously winding up her IV cord, in no acute distress.  2. Psychiatric: Alert and oriented x0.  Uncooperative.  3. Neurologic: Cranial nerves II through XII are grossly intact.  No asymmetric weakness noted.  Patient is not cooperative for most of the neuro exam.  However, and her resistance she demonstrates 5 out of 5 strength in all 4 extremities.  4. HEENMT:  Head is atraumatic normocephalic.  Poor dentition.  Pupils are reactive to light.  No masses in the neck.  Trachea midline.  5. Respiratory : Lungs clear to auscultation bilaterally.  No wheezing or crackles.  6. Cardiovascular : Heart rate is tachycardic.  No murmurs auscultated.  7. Gastrointestinal:  Abdomen is soft, nondistended, nontender to  palpation.  8. Skin:  Patient refuses to let me examine her back, nurse reports erythema over sacrum.    Data Review:    CBC Recent Labs  Lab 12/28/18 1453  WBC 11.8*  HGB 15.1*  HCT 45.9  PLT 358  MCV 87.4  MCH 28.8  MCHC 32.9  RDW 14.6  LYMPHSABS 1.2  MONOABS 0.7  EOSABS 0.0  BASOSABS 0.1   ------------------------------------------------------------------------------------------------------------------  Results for orders placed or performed during the hospital encounter of 12/28/18 (from the past 48 hour(s))  Urinalysis, Routine w reflex microscopic     Status: None   Collection Time: 12/28/18  2:43 PM  Result Value Ref Range   Color, Urine YELLOW YELLOW   APPearance CLEAR CLEAR   Specific Gravity, Urine 1.011 1.005 - 1.030   pH 5.0 5.0 - 8.0   Glucose, UA NEGATIVE NEGATIVE mg/dL   Hgb urine dipstick NEGATIVE NEGATIVE   Bilirubin Urine NEGATIVE NEGATIVE   Ketones, ur NEGATIVE NEGATIVE mg/dL   Protein, ur NEGATIVE NEGATIVE mg/dL   Nitrite NEGATIVE NEGATIVE   Leukocytes,Ua NEGATIVE NEGATIVE    Comment: Performed at [facility], [address] [place], [place] [address]  Comprehensive metabolic panel     Status: Abnormal   Collection Time: 12/28/18  2:53 PM  Result Value Ref Range   Sodium 148 (H) 135 - 145 mmol/L   Potassium 2.3 (LL) 3.5 - 5.1 mmol/L    Comment: CRITICAL RESULT CALLED TO, READ BACK BY AND VERIFIED WITH: MCMILLEN,B AT 1534 ON 8.26.20 BY ISLEY,B    Chloride 109 98 - 111 mmol/L   CO2 23 22 - 32 mmol/L   Glucose, Bld 238 (H) 70 - 99 mg/dL   BUN 18 6 - 20 mg/dL   Creatinine, Ser 5.621.01 (H) 0.44 - 1.00 mg/dL   Calcium 9.0 8.9 - 13.010.3 mg/dL   Total Protein 7.8 6.5 - 8.1 g/dL   Albumin 4.4 3.5 - 5.0 g/dL   AST 38 15 - 41 U/L   ALT 30 0 - 44 U/L   Alkaline Phosphatase 64 38 - 126 U/L   Total Bilirubin 1.3 (H) 0.3 - 1.2 mg/dL   GFR calc non Af Amer >60 >60 mL/min   GFR calc Af Amer >60 >60 mL/min   Anion gap 16 (H) 5 - 15    Comment: Performed  at [facility], [address] [place], [place] [address]  Lactic acid, plasma     Status: Abnormal   Collection Time: 12/28/18  2:53 PM  Result Value Ref Range   Lactic Acid, Venous 5.0 (HH) 0.5 - 1.9 mmol/L    Comment: CRITICAL RESULT CALLED TO, READ BACK BY AND VERIFIED WITH: MCMIILIN,B AT 1557 ON 8.26.20 BY ISLEY,B Performed at [facility], [address] [place], [place] [address]   CBC with Differential     Status: Abnormal   Collection Time: 12/28/18  2:53 PM  Result Value Ref Range   WBC 11.8 (H) 4.0 - 10.5 K/uL   RBC 5.25 (H) 3.87 - 5.11 MIL/uL   Hemoglobin 15.1 (H) 12.0 - 15.0 g/dL   HCT 95.245.9 84.136.0 - 32.446.0 %   MCV 87.4 80.0 - 100.0 fL   MCH 28.8 26.0 - 34.0 pg   MCHC 32.9 30.0 - 36.0 g/dL   RDW 40.114.6 02.711.5 - 25.315.5 %   Platelets 358 150 - 400 K/uL   nRBC 0.0 0.0 - 0.2 %   Neutrophils Relative % 84 %   Neutro Abs 9.8 (H) 1.7 - 7.7 K/uL   Lymphocytes Relative 10 %   Lymphs Abs 1.2 0.7 - 4.0 K/uL   Monocytes Relative 6 %   Monocytes Absolute 0.7 0.1 - 1.0 K/uL   Eosinophils Relative 0 %   Eosinophils Absolute 0.0 0.0 - 0.5 K/uL  Basophils Relative 0 %   Basophils Absolute 0.1 0.0 - 0.1 K/uL   Immature Granulocytes 0 %   Abs Immature Granulocytes 0.02 0.00 - 0.07 K/uL    Comment: Performed at [facility], [address] [place], [place] [address]  Protime-INR     Status: None   Collection Time: 12/28/18  2:53 PM  Result Value Ref Range   Prothrombin Time 15.1 11.4 - 15.2 seconds   INR 1.2 0.8 - 1.2    Comment: (NOTE) INR goal varies based on device and disease states. Performed at [facility], [address] [place], [place] [address]   Culture, blood (Routine x 2)     Status: None (Preliminary result)   Collection Time: 12/28/18  2:53 PM   Specimen: Blood  Result Value Ref Range   Specimen Description BLOOD LEFT WRIST    Special Requests      BOTTLES DRAWN AEROBIC AND ANAEROBIC Blood Culture adequate volume Performed at [facility], [address],  [place], [place] [address]    Culture PENDING    Report Status PENDING   Culture, blood (Routine x 2)     Status: None (Preliminary result)   Collection Time: 12/28/18  3:01 PM   Specimen: Blood  Result Value Ref Range   Specimen Description RIGHT ANTECUBITAL    Special Requests      BOTTLES DRAWN AEROBIC AND ANAEROBIC Blood Culture adequate volume Performed at [facility], [address], [place], [place] [address]    Culture PENDING    Report Status PENDING   D-dimer, quantitative (not at [facility])     Status: Abnormal   Collection Time: 12/28/18  3:01 PM  Result Value Ref Range   D-Dimer, Quant 0.55 (H) 0.00 - 0.50 ug/mL-FEU    Comment: (NOTE) At the manufacturer cut-off of 0.50 ug/mL FEU, this assay has been documented to exclude PE with a sensitivity and negative predictive value of 97 to 99%.  At this time, this assay has not been approved by the FDA to exclude DVT/VTE. Results should be correlated with clinical presentation. Performed at [facility], [address], [place], [place] [address]   Lactic acid, plasma     Status: None   Collection Time: 12/28/18  6:00 PM  Result Value Ref Range   Lactic Acid, Venous 1.6 0.5 - 1.9 mmol/L    Comment: Performed at [facility], [address], [place], [place] [address]    Chemistries  Recent Labs  Lab 12/28/18 1453  NA 148*  K 2.3*  CL 109  CO2 23  GLUCOSE 238*  BUN 18  CREATININE 1.01*  CALCIUM 9.0  AST 38  ALT 30  ALKPHOS 64  BILITOT 1.3*   ------------------------------------------------------------------------------------------------------------------  ------------------------------------------------------------------------------------------------------------------ GFR: Estimated Creatinine Clearance: 68.1 mL/min (A) (by C-G formula based on SCr of 1.01 mg/dL (H)). Liver Function Tests: Recent Labs  Lab 12/28/18 1453  AST 38  ALT 30  ALKPHOS 64  BILITOT 1.3*  PROT 7.8  ALBUMIN 4.4   No results for  input(s): LIPASE, AMYLASE in the last 168 hours. No results for input(s): AMMONIA in the last 168 hours. Coagulation Profile: Recent Labs  Lab 12/28/18 1453  INR 1.2   Cardiac Enzymes: No results for input(s): CKTOTAL, CKMB, CKMBINDEX, TROPONINI in the last 168 hours. BNP (last 3 results) No results for input(s): PROBNP in the last 8760 hours. HbA1C: No results for input(s): HGBA1C in the last 72 hours. CBG: No results for input(s): GLUCAP in the last 168  hours. Lipid Profile: No results for input(s): CHOL, HDL, LDLCALC, TRIG, CHOLHDL, LDLDIRECT in the last 72 hours. Thyroid Function Tests: No results for input(s): TSH, T4TOTAL, FREET4, T3FREE, THYROIDAB in the last 72 hours. Anemia Panel: No results for input(s): VITAMINB12, FOLATE, FERRITIN, TIBC, IRON, RETICCTPCT in the last 72 hours.  --------------------------------------------------------------------------------------------------------------- Urine analysis:    Component Value Date/Time   COLORURINE YELLOW 12/28/2018 1443   APPEARANCEUR CLEAR 12/28/2018 1443   LABSPEC 1.011 12/28/2018 1443   PHURINE 5.0 12/28/2018 1443   GLUCOSEU NEGATIVE 12/28/2018 1443   HGBUR NEGATIVE 12/28/2018 1443   BILIRUBINUR NEGATIVE 12/28/2018 1443   KETONESUR NEGATIVE 12/28/2018 1443   PROTEINUR NEGATIVE 12/28/2018 1443   NITRITE NEGATIVE 12/28/2018 1443   LEUKOCYTESUR NEGATIVE 12/28/2018 1443      Imaging Results:    Ct Head Wo Contrast  Result Date: 12/28/2018 CLINICAL DATA:  Altered mental status EXAM: CT HEAD WITHOUT CONTRAST TECHNIQUE: Contiguous axial images were obtained from the base of the skull through the vertex without intravenous contrast. COMPARISON:  11/17/2018 FINDINGS: Brain: No evidence of acute infarction, hemorrhage, hydrocephalus, extra-axial collection or mass lesion/mass effect. Extensive low-density changes within the periventricular and subcortical white matter compatible with chronic microvascular ischemic  change. Moderate diffuse cerebral volume loss. Size and configuration of the ventricles are unchanged compared to prior. Vascular: No hyperdense vessel or unexpected calcification. Skull: Normal. Negative for fracture or focal lesion. Sinuses/Orbits: Mucosal debris in the dependent left maxillary sinus. Remaining visualized paranasal sinuses and mastoid air cells are. Rectal structures intact. Other: None. IMPRESSION: 1. No acute intracranial findings by CT. Follow-up studies with MRI, as clinically warranted. 2. Extensive chronic microvascular ischemic change in cerebral volume, similar to prior 3. Left maxillary sinusitis. Electronically Signed   By: Duanne GuessNicholas  Plundo M.D.   On: 12/28/2018 17:16   Dg Chest Port 1 View  Result Date: 12/28/2018 CLINICAL DATA:  Fever, tachycardia EXAM: PORTABLE CHEST 1 VIEW COMPARISON:  11/17/2018 FINDINGS: The heart size and mediastinal contours are within normal limits. Both lungs are clear. The visualized skeletal structures are unremarkable. IMPRESSION: No active disease. Electronically Signed   By: Duanne GuessNicholas  Plundo M.D.   On: 12/28/2018 17:19      Assessment & Plan:    Active Problems:   Sepsis (HCC)   1. Sepsis secondary to unknown source 1. Patient initially started on Rocephin in the ED.  UTI is not completely ruled out but since urine analysis is grossly negative, we will escalate antibiotics to more broad-spectrum coverage.  Patient does have reported erythema over sacrum.  Cellulitis?  She also has sinusitis on her CT scan.  Sinus infection as source?  Lactic acid is 5.0, decreased to below 2 after fluids.  Check pro-calcitoninin. Fluid volume resuscitated with 30 mg/kg using weight based algorithm per sepsis order set done in ED. Start targeted antibiotics with Vanco and cefepime based on unknown source. 2. Hypernatremia 1. Likely due to volume depleted status at time of arrival.  Patient has been fluid resuscitated.  Recheck with CMP in the  a.m. 3. Severe hypokalemia 1. 10 mEq of potassium given in the ER.  50 more ordered.  Recheck CMP in the a.m. 4. Elevated d-dimer 1. Age-adjusted d-dimer is 580 mcg/L which is below d-dimer cut off.  VTE unlikely.  Continue to monitor for signs or symptoms of VTE.  Continue patient on subcu heparin prophylaxis. 5. Lactic acidosis 1. Improved with fluid bolus. 6. AKI 1. Creatinine doubled from baseline.  Fluid bolus 30 mix per cake  given.  Recheck creatinine in the morning. 7. Diabetes mellitus type 2 1. Last hemoglobin A1c was 6.7 on November 17, 2018. 2. Hold metformin. 3. Sliding scale correction. 4. Hypoglycemia protocol ordered. 8. Dementia 1. Patient had CT head that was unchanged at this admission.  Patient has had severe white matter pathology on MRI at last admission.  This could be a stepwise vascular dementia.  May be worth checking an MRI in the morning. 9. Hypertension 1. Holding lisinopril due to AKI.  Continue to monitor blood pressure.  If it is not controlled during this hospital stay-consider adding PRN IV hydralazine.  In the ED blood pressure was 150/73. 10. Vitamin D deficiency 1. Continue home regimen.   DVT Prophylaxis-   subcu heparin and SCDs  AM Labs Ordered, also please review Full Orders  Family Communication: Admission, patients condition and plan of care including tests being ordered have been discussed with the patient and Seerit Vile who indicate understanding and agree with the plan and Code Status.  Code Status: Full  Admission status: Inpatient: Based on patients clinical presentation and evaluation of above clinical data, I have made determination that patient meets Inpatient criteria at this time.  Time spent in minutes : 70   Lilyan Gilford M.D on 12/28/2018 at 10:41 PM

## 2018-12-28 NOTE — ED Provider Notes (Signed)
CONTINUING CARE FROM M. VENTER, PA-C.  Patient is a 58 year old female with a history of hypertension, diabetes, dementia,. The patient was seen in the office earlier today with a temperature of 100.7 and tachycardia of 140.  Patient was brought to the emergency department for evaluation.  Patient has been diagnosed with sepsis.  Work-up is in progress.  Sepsis reassessment Temperature is 98.8.  Blood pressure is 146/75.  Pulse oximetry is 96 to 98% on room air.  No changes in patient's mentation. Lactic Acid  Improved from 5 to 1.6. Pt tolerated IV Rocephin. UA is negative. Chest xray is negative. CT head negative for acute  Problems.  Recheck.  Patient awake and alert.  Appears to be back at baseline.  Case discussed with [facility]. Pt to be admitted.   Lily Kocher, PA-C 12/28/18 2049    Nat Christen, MD 12/29/18 [phone]

## 2018-12-29 ENCOUNTER — Other Ambulatory Visit: Payer: Self-pay

## 2018-12-29 ENCOUNTER — Encounter (HOSPITAL_COMMUNITY): Payer: Self-pay | Admitting: *Deleted

## 2018-12-29 LAB — BASIC METABOLIC PANEL
Anion gap: 9 (ref 5–15)
BUN: 8 mg/dL (ref 6–20)
CO2: 25 mmol/L (ref 22–32)
Calcium: 8.2 mg/dL — ABNORMAL LOW (ref 8.9–10.3)
Chloride: 108 mmol/L (ref 98–111)
Creatinine, Ser: 0.55 mg/dL (ref 0.44–1.00)
GFR calc Af Amer: >60 mL/min (ref >60)
GFR calc non Af Amer: >60 mL/min (ref >60)
Glucose, Bld: 207 mg/dL — ABNORMAL HIGH (ref 70–99)
Potassium: 2.4 mmol/L — CL (ref 3.5–5.1)
Sodium: 142 mmol/L (ref 135–145)

## 2018-12-29 LAB — GLUCOSE, CAPILLARY
Glucose-Capillary: 130 mg/dL — ABNORMAL HIGH (ref 70–99)
Glucose-Capillary: 186 mg/dL — ABNORMAL HIGH (ref 70–99)
Glucose-Capillary: 141 mg/dL — ABNORMAL HIGH (ref 70–99)
Glucose-Capillary: 118 mg/dL — ABNORMAL HIGH (ref 70–99)

## 2018-12-29 LAB — URINE CULTURE: Culture: NO GROWTH

## 2018-12-29 LAB — MRSA PCR SCREENING: MRSA by PCR: NEGATIVE

## 2018-12-29 LAB — TSH: TSH: 0.021 u[IU]/mL — ABNORMAL LOW (ref 0.350–4.500)

## 2018-12-29 LAB — SARS CORONAVIRUS 2 (TAT 6-24 HRS): SARS Coronavirus 2: NEGATIVE

## 2018-12-29 LAB — PROCALCITONIN: Procalcitonin: <0.1 ng/mL

## 2018-12-29 MED ORDER — LISINOPRIL 10 MG PO TABS
20.0000 mg | ORAL_TABLET | Freq: Every day | ORAL | Status: DC
  Administered 2018-12-29: 20:00:00 20 mg via ORAL
  Filled 2018-12-29: qty 2

## 2018-12-29 MED ORDER — POTASSIUM CHLORIDE 10 MEQ/100ML IV SOLN
10.0000 meq | INTRAVENOUS | Status: AC
  Administered 2018-12-29 (×4): 10 meq via INTRAVENOUS
  Filled 2018-12-29 (×4): qty 100

## 2018-12-29 MED ORDER — HYDRALAZINE HCL 20 MG/ML IJ SOLN: 10.0000 mg | Freq: Four times a day (QID) | INTRAMUSCULAR | Status: DC | PRN

## 2018-12-29 MED ORDER — POTASSIUM CHLORIDE CRYS ER 20 MEQ PO TBCR
40.0000 meq | EXTENDED_RELEASE_TABLET | ORAL | Status: AC
  Administered 2018-12-29 (×2): 40 meq via ORAL
  Filled 2018-12-29 (×2): qty 2

## 2018-12-29 NOTE — Progress Notes (Addendum)
Patient Demographics:    Monica Ayers, is a 58 y.o. female, DOB - 19-Aug-1960, JJH:[id number]  Admit date - 12/28/2018   Admitting Physician Rolla Plate, DO  Outpatient Primary MD for the patient is Perlie Mayo, NP  LOS - 1   Chief Complaint  Patient presents with  . Altered Mental Status        Subjective:    Monica Ayers today has   no emesis,  No chest pain, resting comfortably, fevers appears to be resolved  Assessment  & Plan :    Active Problems:   Sepsis [facility])  Brief Summary 58 y.o. female, with history significant for type 2 diabetes, hypertension, frontal lobe dementia, vitamin D deficiency, and history of prior CVAs admitted on 12/28/2018 with fevers, tachycardia, and elevated lactic acid and worsening mental status with concerns about possible sepsis   A/p 1)Possible Sepsis--- patient met SIRS criteria on admission with fevers, tachycardia, WBC 11.8 and tachypnea--lactic acid is down to 1.6 from 5.0 after IV fluids -Procalcitonin is not elevated MRSA PCR is negative stop IV vancomycin -Continue IV cefepime pending blood culture results -UA not suggestive of UTI chest x-ray not suggestive of pneumonia- -CT head without new  acute findings  2)Hyponatremia and Hypokalemia--- sodium was 148 on admission potassium was 2.3, hydrate and replace and recheck  3)DM2--recent A1c was 6.7, continue to hold metformin as this may be contributing to lactic acidosis, Use Novolog/Humalog Sliding scale insulin with Accu-Cheks/Fingersticks as ordered   4)HTN--BP is elevated okay to restart lisinopril, creatinine is down to 0.5,  may use IV Hydralazine 10 mg  Every 4 hours Prn for systolic blood pressure over 160 mmhg  5)Abnormal TSH--- TSH is 0.021, check free T4  Disposition/Need for in-Hospital Stay- patient unable to be discharged at this time due to possible sepsis requiring IV  antibiotics pending blood cultures as well as electrolyte abnormalities requiring IV replacement  Code Status : Full  Family Communication:   husband   Disposition Plan  : TBD  Consults  :  Na  DVT Prophylaxis  :  - SCDs   Lab Results  Component Value Date   PLT 358 12/28/2018    Inpatient Medications  Scheduled Meds: . aspirin  325 mg Oral Daily  . atorvastatin  40 mg Oral q1800  . Chlorhexidine Gluconate Cloth  6 each Topical Daily  . docusate sodium  100 mg Oral BID  . heparin  5,000 Units Subcutaneous Q8H  . insulin aspart  0-9 Units Subcutaneous TID WC  . potassium chloride  40 mEq Oral Q3H  . Vitamin D (Ergocalciferol)  50,000 Units Oral Q7 days   Continuous Infusions: . ceFEPime (MAXIPIME) IV Stopped (12/29/18 1306)   PRN Meds:.acetaminophen **OR** acetaminophen, LORazepam, traMADol    Anti-infectives (From admission, onward)   Start     Dose/Rate Route Frequency Ordered Stop   12/29/18 1230  vancomycin (VANCOCIN) IVPB 1000 mg/200 mL premix  Status:  Discontinued     1,000 mg 200 mL/hr over 60 Minutes Intravenous Every 12 hours 12/28/18 2231 12/29/18 0838   12/28/18 2345  ceFEPIme (MAXIPIME) 2 g in sodium chloride 0.9 % 100 mL IVPB  Status:  Discontinued     2 g 200 mL/hr over 30  Minutes Intravenous  Once 12/28/18 2337 12/28/18 2341   12/28/18 2345  vancomycin (VANCOCIN) IVPB 1000 mg/200 mL premix  Status:  Discontinued     1,000 mg 200 mL/hr over 60 Minutes Intravenous  Once 12/28/18 2337 12/28/18 2342   12/28/18 2245  ceFEPIme (MAXIPIME) 2 g in sodium chloride 0.9 % 100 mL IVPB     2 g 200 mL/hr over 30 Minutes Intravenous Every 8 hours 12/28/18 2231     12/28/18 2245  vancomycin (VANCOCIN) IVPB 1000 mg/200 mL premix     1,000 mg 200 mL/hr over 60 Minutes Intravenous Every 1 hr x 2 12/28/18 2231 12/29/18 0116   12/28/18 1500  cefTRIAXone (ROCEPHIN) 1 g in sodium chloride 0.9 % 100 mL IVPB     1 g 200 mL/hr over 30 Minutes Intravenous  Once 12/28/18  1454 12/28/18 1554        Objective:   Vitals:   12/29/18 0732 12/29/18 0800 12/29/18 1124 12/29/18 1622  BP:  (!) 168/79    Pulse:  72    Resp:  11    Temp: 97.9 F (36.6 C)  98.1 F (36.7 C) 98 F (36.7 C)  TempSrc: Oral  Axillary Oral  SpO2:  98%    Weight:      Height:        Wt Readings from Last 3 Encounters:  12/29/18 71.9 kg  12/12/18 73.5 kg  11/18/18 77.9 kg     Intake/Output Summary (Last 24 hours) at 12/29/2018 1852 Last data filed at 12/29/2018 1700 Gross per 24 hour  Intake 1089.51 ml  Output 800 ml  Net 289.51 ml     Physical Exam  Gen:- Awake Alert, mostly nonverbal HEENT:- [place].AT, No sclera icterus Neck-Supple Neck,No JVD,.  Lungs-  CTAB , fair symmetrical air movement CV- S1, S2 normal, regular  Abd-  +ve B.Sounds, Abd Soft, No tenderness,    Extremity/Skin:- No  edema, pedal pulses present  Psych-severe cognitive deficits Neuro-generalized weakness, however appears to be at baseline no new focal deficits, no tremors   Data Review:   Micro Results Recent Results (from the past 240 hour(s))  Culture, blood (Routine x 2)     Status: None (Preliminary result)   Collection Time: 12/28/18  2:53 PM   Specimen: BLOOD LEFT WRIST  Result Value Ref Range Status   Specimen Description BLOOD LEFT WRIST  Final   Special Requests   Final    BOTTLES DRAWN AEROBIC AND ANAEROBIC Blood Culture adequate volume   Culture   Final    NO GROWTH < 24 HOURS Performed at [facility], [address], [place], [place] [address]    Report Status PENDING  Incomplete  Culture, blood (Routine x 2)     Status: None (Preliminary result)   Collection Time: 12/28/18  3:01 PM   Specimen: Right Antecubital; Blood  Result Value Ref Range Status   Specimen Description RIGHT ANTECUBITAL  Final   Special Requests   Final    BOTTLES DRAWN AEROBIC AND ANAEROBIC Blood Culture adequate volume   Culture   Final    NO GROWTH < 24 HOURS Performed at [facility], [address], [place],  [address]    Report Status PENDING  Incomplete  Urine culture     Status: None   Collection Time: 12/28/18  3:05 PM   Specimen: In/Out Cath Urine  Result Value Ref Range Status   Specimen Description   Final    IN/OUT CATH URINE Performed  at [facility], [address] [place], [place] [address]    Special Requests   Final    NONE Performed at [facility], [address], [place], [place] [address]    Culture   Final    NO GROWTH Performed at [facility], [place] [address] [place], [place] [address]    Report Status 12/29/2018 FINAL  Final  SARS CORONAVIRUS 2 (TAT 6-12 HRS) Nasal Swab Aptima Multi Swab     Status: None   Collection Time: 12/28/18  3:27 PM   Specimen: Aptima Multi Swab; Nasal Swab  Result Value Ref Range Status   SARS Coronavirus 2 NEGATIVE NEGATIVE Final    Comment: (NOTE) SARS-CoV-2 target nucleic acids are NOT DETECTED. The SARS-CoV-2 RNA is generally detectable in upper and lower respiratory specimens during the acute phase of infection. Negative results do not preclude SARS-CoV-2 infection, do not rule out co-infections with other pathogens, and should not be used as the sole basis for treatment or other patient management decisions. Negative results must be combined with clinical observations, patient history, and epidemiological information. The expected result is Negative. Fact Sheet for Patients: SugarRoll.be Fact Sheet for Healthcare Providers: https://www.woods-mathews.com/ This test is not yet approved or cleared by the [place] FDA and  has been authorized for detection and/or diagnosis of SARS-CoV-2 by FDA under an Emergency Use Authorization (EUA). This EUA will remain  in effect (meaning this test can be used) for the duration of the COVID-19 declaration under Section 56 4(b)(1) of the Act, 21 U.S.C. section 360bbb-3(b)(1), unless the authorization is terminated or  revoked sooner. Performed at [facility], [place] [address], [place], [place] [address]   MRSA PCR Screening     Status: None   Collection Time: 12/28/18 11:37 PM   Specimen: Nasal Mucosa; Nasopharyngeal  Result Value Ref Range Status   MRSA by PCR NEGATIVE NEGATIVE Final    Comment:        The GeneXpert MRSA Assay (FDA approved for NASAL specimens only), is one component of a comprehensive MRSA colonization surveillance program. It is not intended to diagnose MRSA infection nor to guide or monitor treatment for MRSA infections. Performed at [facility] [address], [place], [place] [address]    Radiology Reports Ct Head Wo Contrast  Result Date: 12/28/2018 CLINICAL DATA:  Altered mental status EXAM: CT HEAD WITHOUT CONTRAST TECHNIQUE: Contiguous axial images were obtained from the base of the skull through the vertex without intravenous contrast. COMPARISON:  11/17/2018 FINDINGS: Brain: No evidence of acute infarction, hemorrhage, hydrocephalus, extra-axial collection or mass lesion/mass effect. Extensive low-density changes within the periventricular and subcortical white matter compatible with chronic microvascular ischemic change. Moderate diffuse cerebral volume loss. Size and configuration of the ventricles are unchanged compared to prior. Vascular: No hyperdense vessel or unexpected calcification. Skull: Normal. Negative for fracture or focal lesion. Sinuses/Orbits: Mucosal debris in the dependent left maxillary sinus. Remaining visualized paranasal sinuses and mastoid air cells are. Rectal structures intact. Other: None. IMPRESSION: 1. No acute intracranial findings by CT. Follow-up studies with MRI, as clinically warranted. 2. Extensive chronic microvascular ischemic change in cerebral volume, similar to prior 3. Left maxillary sinusitis. Electronically Signed   By: Davina Poke M.D.   On: 12/28/2018 17:16   Dg Chest Port 1 View  Result Date: 12/28/2018 CLINICAL  DATA:  Fever, tachycardia EXAM: PORTABLE CHEST 1 VIEW COMPARISON:  11/17/2018 FINDINGS: The heart size and mediastinal contours are within normal limits. Both lungs  are clear. The visualized skeletal structures are unremarkable. IMPRESSION: No active disease. Electronically Signed   By: Davina Poke M.D.   On: 12/28/2018 17:19    CBC Recent Labs  Lab 12/28/18 1453  WBC 11.8*  HGB 15.1*  HCT 45.9  PLT 358  MCV 87.4  MCH 28.8  MCHC 32.9  RDW 14.6  LYMPHSABS 1.2  MONOABS 0.7  EOSABS 0.0  BASOSABS 0.1    Chemistries  Recent Labs  Lab 12/28/18 1453 12/29/18 1144  NA 148* 142  K 2.3* 2.4*  CL 109 108  CO2 23 25  GLUCOSE 238* 207*  BUN 18 8  CREATININE 1.01* 0.55  CALCIUM 9.0 8.2*  AST 38  --   ALT 30  --   ALKPHOS 64  --   BILITOT 1.3*  --   ------------------------------------------------------------------------------------------------------------------ No results for input(s): CHOL, HDL, LDLCALC, TRIG, CHOLHDL, LDLDIRECT in the last 72 hours.  Lab Results  Component Value Date   HGBA1C 6.7 (H) 11/17/2018   ------------------------------------------------------------------------------------------------------------------ Recent Labs    12/28/18 1800  TSH 0.021*   ------------------------------------------------------------------------------------------------------------------ No results for input(s): VITAMINB12, FOLATE, FERRITIN, TIBC, IRON, RETICCTPCT in the last 72 hours.  Coagulation profile Recent Labs  Lab 12/28/18 1453  INR 1.2    Recent Labs    12/28/18 1501  DDIMER 0.55*   Cardiac Enzymes No results for input(s): CKMB, TROPONINI, MYOGLOBIN in the last 168 hours.  Invalid input(s): CK ------------------------------------------------------------------------------------------------------------------ No results found for: BNP  Roxan Hockey M.D on 12/29/2018 at 6:52 PM  Go to [email or web address] - for contact info  [facility] - Office   [phone]

## 2018-12-29 NOTE — Progress Notes (Signed)
CRITICAL VALUE ALERT  Critical Value:  K+ 2.4   Date & Time Notied:  12/29/2018 @ 1218  Provider Notified: Dr. Denton Brick  Orders Received/Actions taken: See chart

## 2018-12-30 DIAGNOSIS — E872 Acidosis: Secondary | ICD-10-CM

## 2018-12-30 LAB — BASIC METABOLIC PANEL
Anion gap: 11 (ref 5–15)
BUN: 5 mg/dL — ABNORMAL LOW (ref 6–20)
CO2: 25 mmol/L (ref 22–32)
Calcium: 8.6 mg/dL — ABNORMAL LOW (ref 8.9–10.3)
Chloride: 110 mmol/L (ref 98–111)
Creatinine, Ser: 0.52 mg/dL (ref 0.44–1.00)
GFR calc Af Amer: >60 mL/min (ref >60)
GFR calc non Af Amer: >60 mL/min (ref >60)
Glucose, Bld: 203 mg/dL — ABNORMAL HIGH (ref 70–99)
Potassium: 3.2 mmol/L — ABNORMAL LOW (ref 3.5–5.1)
Sodium: 146 mmol/L — ABNORMAL HIGH (ref 135–145)

## 2018-12-30 LAB — C DIFFICILE QUICK SCREEN W PCR REFLEX
C Diff antigen: NEGATIVE
C Diff interpretation: NOT DETECTED
C Diff toxin: NEGATIVE

## 2018-12-30 LAB — T4, FREE: Free T4: 1.42 ng/dL — ABNORMAL HIGH (ref 0.61–1.12)

## 2018-12-30 LAB — CBC
HCT: 41 % (ref 36.0–46.0)
Hemoglobin: 13.7 g/dL (ref 12.0–15.0)
MCH: 29 pg (ref 26.0–34.0)
MCHC: 33.4 g/dL (ref 30.0–36.0)
MCV: 86.9 fL (ref 80.0–100.0)
Platelets: 246 10*3/uL (ref 150–400)
RBC: 4.72 MIL/uL (ref 3.87–5.11)
RDW: 14.5 % (ref 11.5–15.5)
WBC: 6.7 10*3/uL (ref 4.0–10.5)
nRBC: 0 % (ref 0.0–0.2)

## 2018-12-30 LAB — URINE CULTURE: Culture: NO GROWTH

## 2018-12-30 LAB — GLUCOSE, CAPILLARY
Glucose-Capillary: 154 mg/dL — ABNORMAL HIGH (ref 70–99)
Glucose-Capillary: 197 mg/dL — ABNORMAL HIGH (ref 70–99)

## 2018-12-30 LAB — MAGNESIUM: Magnesium: 2.2 mg/dL (ref 1.7–2.4)

## 2018-12-30 MED ORDER — AMLODIPINE BESYLATE 2.5 MG PO TABS: 2.5000 mg | ORAL_TABLET | Freq: Every day | ORAL | 2 refills | Status: DC

## 2018-12-30 MED ORDER — AMLODIPINE BESYLATE 5 MG PO TABS
5.0000 mg | ORAL_TABLET | Freq: Every day | ORAL | Status: DC
  Administered 2018-12-30: 5 mg via ORAL
  Filled 2018-12-30: qty 1

## 2018-12-30 MED ORDER — POTASSIUM CHLORIDE CRYS ER 20 MEQ PO TBCR
40.0000 meq | EXTENDED_RELEASE_TABLET | Freq: Once | ORAL | Status: AC
  Administered 2018-12-30: 40 meq via ORAL
  Filled 2018-12-30: qty 2

## 2018-12-30 MED ORDER — ASPIRIN 325 MG PO TBEC: 325.0000 mg | DELAYED_RELEASE_TABLET | Freq: Every day | ORAL | 3 refills | Status: DC

## 2018-12-30 MED ORDER — ACETAMINOPHEN 325 MG PO TABS: 650.0000 mg | ORAL_TABLET | Freq: Four times a day (QID) | ORAL | 0 refills | Status: AC | PRN

## 2018-12-30 MED ORDER — LISINOPRIL 10 MG PO TABS: 10.0000 mg | ORAL_TABLET | Freq: Every day | ORAL | 2 refills | Status: DC

## 2018-12-30 MED ORDER — POTASSIUM CHLORIDE ER 20 MEQ PO TBCR: 20.0000 meq | EXTENDED_RELEASE_TABLET | Freq: Every day | ORAL | 1 refills | Status: DC

## 2018-12-30 NOTE — TOC Initial Note (Signed)
Transition of Care [facility](TOC) - Initial/Assessment Note    Patient Details  Name: Monica MassonRochelle M Ayers MRN: [id number] Date of Birth: 1961-03-21  Transition of Care [facility](TOC) CM/SW Contact:    Leitha Bleakockram, Lior Cartelli, RN Phone Number: 12/30/2018, 9:42 AM  Clinical Narrative:     Patient admitted for Sepsis, she is confused. Lives at home with her husband. Patient has frontal lobe dementia. Spoke with her husband Fayrene FearingJames. He is disable, so he is at home to take care of her.  He gets assistance from his sister. For now he wants to continue to care for her at home. They have used [facility] for PT in the past. Wishes to have PT come back. We discussed adding a SW for him to just have some discussion for long term planning as disease progresses. He agreed.             Expected Discharge Plan: Home w Home Health Services Barriers to Discharge: Continued Medical Work up   Patient Goals and CMS Choice Patient states their goals for this hospitalization and ongoing recovery are:: to go back home. CMS Medicare.gov Compare Post Acute Care list provided to:: Patient Represenative (must comment)(husband) Choice offered to / list presented to : Spouse  Expected Discharge Plan and Services Expected Discharge Plan: Home w Home Health Services     Post Acute Care Choice: Home Health Living arrangements for the past 2 months: Single Family Home Expected Discharge Date: 01/01/19                   Prior Living Arrangements/Services Living arrangements for the past 2 months: Single Family Home Lives with:: Spouse   Do you feel safe going back to the place where you live?: Yes      Need for Family Participation in Patient Care: Yes (Comment) Care giver support system in place?: Yes (comment)   Criminal Activity/Legal Involvement Pertinent to Current Situation/Hospitalization: No - Comment as needed  Activities of Daily Living Home Assistive Devices/Equipment: None ADL Screening (condition at time of  admission) Patient's cognitive ability adequate to safely complete daily activities?: No Is the patient deaf or have difficulty hearing?: No Does the patient have difficulty seeing, even when wearing glasses/contacts?: No Does the patient have difficulty concentrating, remembering, or making decisions?: Yes Patient able to express need for assistance with ADLs?: Yes Does the patient have difficulty dressing or bathing?: Yes Independently performs ADLs?: No Communication: Independent Dressing (OT): Needs assistance Is this a change from baseline?: Pre-admission baseline Grooming: Needs assistance Is this a change from baseline?: Pre-admission baseline Feeding: Independent Bathing: Needs assistance Is this a change from baseline?: Pre-admission baseline Toileting: Needs assistance Is this a change from baseline?: Pre-admission baseline In/Out Bed: Independent Walks in Home: Independent Does the patient have difficulty walking or climbing stairs?: No Weakness of Legs: None Weakness of Arms/Hands: None  Permission Sought/Granted                  Emotional Assessment              Admission diagnosis:  Hypokalemia [E87.6] Sepsis (HCC) [A41.9] Sepsis without acute organ dysfunction, due to unspecified organism [facility](HCC) [A41.9] Patient Active Problem List   Diagnosis Date Noted  . Sepsis (HCC) 12/28/2018  . Hypokalemia   . Unsteady gait 12/13/2018  . Falls frequently 12/13/2018  . Pressure injury of skin 11/18/2018  . Transaminasemia 11/18/2018  . Sepsis due to undetermined organism (HCC) 11/17/2018  . Lactic acidosis 11/17/2018  . Type 2 diabetes  mellitus ([place]) 11/17/2018  . Frontal lobe dementia ([place]) 11/17/2018  . Essential hypertension 10/02/2016  . Allergic state 10/02/2016   PCP:  Perlie Mayo, NP Pharmacy:   [facility] [address] [place] [address] Phone: [phone] Fax: [phone]          Readmission Risk Interventions Readmission Risk Prevention Plan 11/20/2018  Post Dischage Appt Complete  Medication Screening Complete  Transportation Screening Complete

## 2018-12-30 NOTE — Evaluation (Addendum)
Physical Therapy Evaluation Patient Details Name: Monica Ayers MRN: [id number] DOB: October 06, 1960 Today's Date: 12/30/2018   History of Present Illness  Monica Ayers  is a 58 y.o. female, with history significant for type 2 diabetes, hypertension, dementia, vitamin D deficiency, and seasonal allergies.  Patient reports to the ER from primary care physician's office.  Patient reportedly had a temperature of 100.7, and heart rate of 145.  Husband had reported malodorous urine as well, so patient was sent to the ER.  Patient is not able to give any history at all during my exam.  Apparently she was nonverbal when she arrived.  Is alert and oriented x0, and patient's husband reports that this is her baseline.  So history is taken from husband, medical chart, and ER staff.    Clinical Impression  Patient agreeable for therapy with much encouragement, responds a lot better when her spouse is present.   Patient demonstrates slow labored movement for sitting up at bedside and required much verbal/tactile cueing to transfer to chair mostly due to self limiting.  Later when patient spouse present, patient able to ambulate in room and hallway without loss of balance and tolerated sitting up in chair with her spouse present at bedside.  Patient spouse states she normally uses a rollator at home, but feels patient is safer using RW - CM notified.  Patient to be discharged home to day and discharged from physical therapy to care of nursing for ambulation daily as tolerated for length of stay.    Follow Up Recommendations Home health PT;Supervision for mobility/OOB;Supervision/Assistance - 24 hour    Equipment Recommendations  Rolling walker with 5" wheels    Recommendations for Other Services       Precautions / Restrictions Precautions Precautions: Fall Restrictions Weight Bearing Restrictions: No      Mobility  Bed Mobility Overal bed mobility: Needs Assistance Bed Mobility: Supine to Sit      Supine to sit: Mod assist     General bed mobility comments: limited mostly due to resisting movement  Transfers Overall transfer level: Needs assistance Equipment used: Rolling walker (2 wheeled);None Transfers: Sit to/from [employer] to Stand: Min assist Stand pivot transfers: Min assist       General transfer comment: patient cooperatives more when spouse present, frequent verbal/tactile cueing for safety  Ambulation/Gait Ambulation/Gait assistance: Min guard Gait Distance (Feet): 50 Feet Assistive device: Rolling walker (2 wheeled) Gait Pattern/deviations: Decreased step length - right;Decreased step length - left;Decreased stride length Gait velocity: decreased   General Gait Details: slightly labored cadence without loss of balance, requires verbal and occasional tactile cueing in order to follow directions  Stairs            Wheelchair Mobility    Modified Rankin (Stroke Patients Only)       Balance Overall balance assessment: Needs assistance Sitting-balance support: Feet supported;No upper extremity supported Sitting balance-Leahy Scale: Fair Sitting balance - Comments: seated at bedside and in chair   Standing balance support: No upper extremity supported;During functional activity Standing balance-Leahy Scale: Fair Standing balance comment: fair/good using RW                             Pertinent Vitals/Pain Pain Assessment: Faces Faces Pain Scale: Hurts a little bit Pain Location: pressure to right shoulder Pain Descriptors / Indicators: Sore Pain Intervention(s): Limited activity within patient's tolerance;Monitored during session    Home Living Family/patient expects  to be discharged to:: Private residence   Available Help at Discharge: Family;Available 24 hours/day Type of Home: Mobile home Home Access: Stairs to enter Entrance Stairs-Rails: Right;Left;Can reach both Entrance Stairs-Number of Steps:  3-4 Home Layout: One level Home Equipment: Walker - 4 wheels;Bedside commode;Wheelchair - manual;Cane - single point      Prior Function Level of Independence: Needs assistance   Gait / Transfers Assistance Needed: Assisted household ambulation using Rollator  ADL's / Homemaking Assistance Needed: assisted by spouse        Hand Dominance        Extremity/Trunk Assessment   Upper Extremity Assessment Upper Extremity Assessment: Overall WFL for tasks assessed    Lower Extremity Assessment Lower Extremity Assessment: Generalized weakness    Cervical / Trunk Assessment Cervical / Trunk Assessment: Normal  Communication   Communication: Other (comment)(congnitive impairments limits communication)  Cognition Arousal/Alertness: Awake/alert Behavior During Therapy: Flat affect;Impulsive Overall Cognitive Status: History of cognitive impairments - at baseline                                 General Comments: requires Max verbal/tactile cueing to follow instructions      General Comments      Exercises     Assessment/Plan    PT Assessment All further PT needs can be met in the next venue of care  PT Problem List Decreased strength;Decreased activity tolerance;Decreased balance;Decreased mobility       PT Treatment Interventions      PT Goals (Current goals can be found in the Care Plan section)  Acute Rehab PT Goals Patient Stated Goal: return home with spouse to assist PT Goal Formulation: With patient/family Time For Goal Achievement: 12/30/18 Potential to Achieve Goals: Good    Frequency     Barriers to discharge        Co-evaluation               AM-PAC PT "6 Clicks" Mobility  Outcome Measure Help needed turning from your back to your side while in a flat bed without using bedrails?: A Little Help needed moving from lying on your back to sitting on the side of a flat bed without using bedrails?: A Lot Help needed moving to and  from a bed to a chair (including a wheelchair)?: A Little Help needed standing up from a chair using your arms (e.g., wheelchair or bedside chair)?: A Little Help needed to walk in hospital room?: A Little Help needed climbing 3-5 steps with a railing? : A Little 6 Click Score: 17    End of Session Equipment Utilized During Treatment: Gait belt Activity Tolerance: Patient tolerated treatment well;Patient limited by fatigue Patient left: in chair;with call bell/phone within reach;with family/visitor present Nurse Communication: Mobility status PT Visit Diagnosis: Unsteadiness on feet (R26.81);Other abnormalities of gait and mobility (R26.89);Muscle weakness (generalized) (M62.81)    Time: 1024-1100 PT Time Calculation (min) (ACUTE ONLY): 36 min   Charges:   PT Evaluation $PT Eval Moderate Complexity: 1 Mod PT Treatments $Therapeutic Activity: 23-37 mins        2:37 PM, 12/30/18 Lonell Grandchild, MPT Physical Therapist with [facility] [phone] office [phone] mobile phone

## 2018-12-30 NOTE — Progress Notes (Signed)
Discharge information given to patient and husband. Both verbalized understanding. One peripheral IV removed prior to discharge. Patient was able to walk to wheelchair and get dressed. Patient taken down to front entrance via wheelchair to be discharged to home with husband.

## 2018-12-30 NOTE — Discharge Summary (Addendum)
Monica Ayers, is a 58 y.o. female  DOB 06-Jan-1961  MRN [id number].  Admission date:  12/28/2018  Admitting Physician  Rolla Plate, DO  Discharge Date:  12/30/2018   Primary MD  Perlie Mayo, NP  Recommendations for primary care physician for things to follow:   1)Check your blood pressure daily--- keep a diary/record of your blood pressure and have the primary care physician adjust your blood pressure medications accordingly 2) recheck TSH/thyroid test in 4 to 6 weeks  Admission Diagnosis  Hypokalemia [E87.6] Sepsis ([place]) [A41.9] Sepsis without acute organ dysfunction, due to unspecified organism [facility]) [A41.9]   Discharge Diagnosis  Hypokalemia [E87.6] Sepsis ([place]) [A41.9] Sepsis without acute organ dysfunction, due to unspecified organism [facility]) [A41.9]    Principal Problem:   Lactic acidosis Active Problems:   Essential hypertension   Type 2 diabetes mellitus ([place])   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 female, with history significant for type 2 diabetes, hypertension, dementia, vitamin D deficiency, and seasonal allergies.  Patient reports to the ER from primary care physician's office.  Patient reportedly had a temperature of 100.7, and heart rate of 145.  Husband had reported malodorous urine as well, so patient was sent to the ER.  Patient is not able to give any history at all during my exam.  Apparently she was nonverbal when she arrived.  Is alert and oriented  x0, and patient's husband reports that this is her baseline.  So history is taken from husband, medical chart, and ER staff.  Patient was recently admitted last month and the month before for progressively worsening mental status.  At last admission her complaint was anorexia and fatigue.  She was admitted at that time for sepsis due to undetermined organism.  She was started on broad-spectrum antibiotics.  She did have an MRI that demonstrated acute, deep white matter CVAs at that time.  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  At that time she was seen by neurology who recommended EEG in the outpatient setting, as well as continued full dose aspirin and statin therapy.  Patient was referred to neurology for an outpatient visit but it does not appear that she has been able to follow-up with them at this point.  ED course:  Patient had chemistry profile done that revealed hypernatremia at 148, severe hypokalemia of 2.3, glucose of 238, and AKI with a  creatinine of 1.01 up from 0.5 baseline, anion gap of 16, elevated total bilirubin at 1.3, lactic acid of 5.0 and repeat 1.6.  Hematology revealed a leukocytosis with white blood cell 11.8, elevated hemoglobin at 15.1, platelets of 358.  D-dimer was 0.55, INR 1.2.  Glucose showed hyperglycemia at 238, last hemoglobin A1c was November 17, 2018 and was 6.7.  Last TSH was 0.427 on the same day as a hemoglobin A1c.  COVID test in process.  Urine was not suspicious for UTI -urine culture ordered.  Patient had CT scan of head that showed 1 no acute intracranial findings by CT follow-up studies with MRI as clinically warranted.  2 extensive chronic microvascular ischemic change and cerebral volume, similar to prior.  And 3 left maxillary sinusitis.  Chest x-ray shows no active disease.  EKG shows sinus tachycardia 125, and prolonged QT at 523.  I personally called and spoke to husband to try to elicit more history.  He  reports no abnormal observations with patient.  This includes no diarrhea, no vomiting, he reports no skin rashes, no cough, no complaints of earache.  The only thing that he has noticed is the malodorous urine.  He reports no fever at home.    Hospital Course:     Brief Summary 58 y.o.female,with history significant for type 2 diabetes, hypertension, frontal lobe dementia, vitamin D deficiency,and history of prior CVAs admitted on 12/28/2018 with fevers, tachycardia, and elevated lactic acid and worsening mental status with concerns about possible sepsis   A/p 1)Possible Sepsis--- patient met SIRS criteria on admission with fevers, tachycardia, WBC 11.8 and tachypnea--lactic acid is down to 1.6 from 5.0 after IV fluids -Procalcitonin is not elevated MRSA PCR is negative  --Initially treated with IV vancomycin and IV cefepime -Urine and blood cultures NGTD -- chest x-ray NOT suggestive of pneumonia- -CT head without new  acute findings -Stool for C. difficile negative -Patient is afebrile, WBC 6.7 -On admission sepsis was suspected, in retrospect sepsis diagnosis NOT confirmed  2)Hyponatremia and Hypokalemia----improved, sodium is 146 now on potassium is 3.2 -Replaced  3)DM2--recent A1c was 6.7, okay to restart metformin    4)HTN--elevated BP noted, increase lisinopril to 10 mg daily and give amlodipine 2.5 mg daily, c  5)Abnormal TSH--- TSH is 0.021,  free T4 pending -TSH advised in about 4 to 6 weeks  Code Status : Full  Family Communication:    Discussed with patient's husband prior to discharge   Disposition Plan  :  Discharge home with home health physical therapy and social worker  Consults  :  Na   Discharge Condition: Stable according to patient's husband she is back to baseline  Follow UP--- PCP for recheck and blood pressure recheck   Diet and Activity recommendation:  As advised  Discharge Instructions    Discharge Instructions    Call MD  for:  difficulty breathing, headache or visual disturbances   Complete by: As directed    Call MD for:  persistant dizziness or light-headedness   Complete by: As directed    Call MD for:  persistant nausea and vomiting   Complete by: As directed    Call MD for:  severe uncontrolled pain   Complete by: As directed    Call MD for:  temperature >100.4   Complete by: As directed    Diet - low sodium heart healthy   Complete by: As directed    Discharge instructions   Complete by: As directed    1)  check your blood pressure daily--- keep a diary/record of your blood pressure and have the primary care physician adjust your blood pressure medications accordingly 2) recheck TSH/thyroid test in 4 to 6 weeks   Increase activity slowly   Complete by: As directed         Discharge Medications     Allergies as of 12/30/2018      Reactions   Latex Rash      Medication List    TAKE these medications   acetaminophen 325 MG tablet Commonly known as: TYLENOL Take 2 tablets (650 mg total) by mouth every 6 (six) hours as needed for mild pain, fever or headache (or Fever >/= 101).   amLODipine 2.5 MG tablet Commonly known as: NORVASC Take 1 tablet (2.5 mg total) by mouth daily. For BP Start taking on: December 31, 2018   aspirin 325 MG EC tablet Take 1 tablet (325 mg total) by mouth daily with breakfast. What changed: when to take this   atorvastatin 40 MG tablet Commonly known as: LIPITOR Take 1 tablet (40 mg total) by mouth daily at 6 PM.   lisinopril 10 MG tablet Commonly known as: ZESTRIL Take 1 tablet (10 mg total) by mouth daily. For BP What changed:   medication strength  how much to take  additional instructions   LORazepam 0.5 MG tablet Commonly known as: ATIVAN Take 1 tablet (0.5 mg total) by mouth daily as needed for anxiety. Take 30-1 hour prior to needing shower   metFORMIN 500 MG tablet Commonly known as: GLUCOPHAGE Take 500 mg by mouth daily.   Potassium  Chloride ER 20 MEQ Tbcr Take 20 mEq by mouth daily for 3 days. 1 tab daily by mouth   Vitamin D (Ergocalciferol) 1.25 MG (50000 UT) Caps capsule Commonly known as: DRISDOL Take 1 capsule (50,000 Units total) by mouth every 7 (seven) days.      Major procedures and Radiology Reports - PLEASE review detailed and final reports for all details, in brief -   Ct Head Wo Contrast  Result Date: 12/28/2018 CLINICAL DATA:  Altered mental status EXAM: CT HEAD WITHOUT CONTRAST TECHNIQUE: Contiguous axial images were obtained from the base of the skull through the vertex without intravenous contrast. COMPARISON:  11/17/2018 FINDINGS: Brain: No evidence of acute infarction, hemorrhage, hydrocephalus, extra-axial collection or mass lesion/mass effect. Extensive low-density changes within the periventricular and subcortical white matter compatible with chronic microvascular ischemic change. Moderate diffuse cerebral volume loss. Size and configuration of the ventricles are unchanged compared to prior. Vascular: No hyperdense vessel or unexpected calcification. Skull: Normal. Negative for fracture or focal lesion. Sinuses/Orbits: Mucosal debris in the dependent left maxillary sinus. Remaining visualized paranasal sinuses and mastoid air cells are. Rectal structures intact. Other: None. IMPRESSION: 1. No acute intracranial findings by CT. Follow-up studies with MRI, as clinically warranted. 2. Extensive chronic microvascular ischemic change in cerebral volume, similar to prior 3. Left maxillary sinusitis. Electronically Signed   By: Davina Poke M.D.   On: 12/28/2018 17:16   Dg Chest Port 1 View  Result Date: 12/28/2018 CLINICAL DATA:  Fever, tachycardia EXAM: PORTABLE CHEST 1 VIEW COMPARISON:  11/17/2018 FINDINGS: The heart size and mediastinal contours are within normal limits. Both lungs are clear. The visualized skeletal structures are unremarkable. IMPRESSION: No active disease. Electronically Signed   By:  Davina Poke M.D.   On: 12/28/2018 17:19    Micro Results   Recent Results (from the past 240 hour(s))  Culture, blood (Routine  x 2)     Status: None (Preliminary result)   Collection Time: 12/28/18  2:53 PM   Specimen: BLOOD LEFT WRIST  Result Value Ref Range Status   Specimen Description BLOOD LEFT WRIST  Final   Special Requests   Final    BOTTLES DRAWN AEROBIC AND ANAEROBIC Blood Culture adequate volume   Culture   Final    NO GROWTH 2 DAYS Performed at [facility], [address] [place], [place] [address]    Report Status PENDING  Incomplete  Culture, blood (Routine x 2)     Status: None (Preliminary result)   Collection Time: 12/28/18  3:01 PM   Specimen: Right Antecubital; Blood  Result Value Ref Range Status   Specimen Description RIGHT ANTECUBITAL  Final   Special Requests   Final    BOTTLES DRAWN AEROBIC AND ANAEROBIC Blood Culture adequate volume   Culture   Final    NO GROWTH 2 DAYS Performed at [facility], [address], [place], [place] [address]    Report Status PENDING  Incomplete  Urine culture     Status: None   Collection Time: 12/28/18  3:05 PM   Specimen: In/Out Cath Urine  Result Value Ref Range Status   Specimen Description   Final    IN/OUT CATH URINE Performed at [facility], [address], [place], [place] [address]    Special Requests   Final    NONE Performed at [facility], [address], [place], [place] [address]    Culture   Final    NO GROWTH Performed at [facility], [place] [address], [place], [place] [address]    Report Status 12/29/2018 FINAL  Final  SARS CORONAVIRUS 2 (TAT 6-12 HRS) Nasal Swab Aptima Multi Swab     Status: None   Collection Time: 12/28/18  3:27 PM   Specimen: Aptima Multi Swab; Nasal Swab  Result Value Ref Range Status   SARS Coronavirus 2 NEGATIVE NEGATIVE Final    Comment: (NOTE) SARS-CoV-2 target nucleic acids are NOT DETECTED. The SARS-CoV-2 RNA is generally detectable in upper and  lower respiratory specimens during the acute phase of infection. Negative results do not preclude SARS-CoV-2 infection, do not rule out co-infections with other pathogens, and should not be used as the sole basis for treatment or other patient management decisions. Negative results must be combined with clinical observations, patient history, and epidemiological information. The expected result is Negative. Fact Sheet for Patients: SugarRoll.be Fact Sheet for Healthcare Providers: https://www.woods-mathews.com/ This test is not yet approved or cleared by the [place] FDA and  has been authorized for detection and/or diagnosis of SARS-CoV-2 by FDA under an Emergency Use Authorization (EUA). This EUA will remain  in effect (meaning this test can be used) for the duration of the COVID-19 declaration under Section 56 4(b)(1) of the Act, 21 U.S.C. section 360bbb-3(b)(1), unless the authorization is terminated or revoked sooner. Performed at [facility], [place] [address] [place], [place] [address]   MRSA PCR Screening     Status: None   Collection Time: 12/28/18 11:37 PM   Specimen: Nasal Mucosa; Nasopharyngeal  Result Value Ref Range Status   MRSA by PCR NEGATIVE NEGATIVE Final    Comment:        The GeneXpert MRSA Assay (FDA approved for NASAL specimens only), is one component of a comprehensive MRSA colonization surveillance program. It is not intended to diagnose MRSA infection nor to guide or monitor treatment for  MRSA infections. Performed at [facility], [address], [place], [place] [address]   Urine culture     Status: None   Collection Time: 12/28/18 11:38 PM   Specimen: Urine, Clean Catch  Result Value Ref Range Status   Specimen Description   Final    URINE, CLEAN CATCH Performed at [facility], [address], [place], [place] [address]    Special Requests   Final    NONE Performed at [facility],  [address], [place], [place] [address]    Culture   Final    NO GROWTH Performed at [facility], [place] [address] [place], [place] [address]    Report Status 12/30/2018 FINAL  Final  C difficile quick scan w PCR reflex     Status: None   Collection Time: 12/30/18  9:38 AM   Specimen: STOOL  Result Value Ref Range Status   C Diff antigen NEGATIVE NEGATIVE Final   C Diff toxin NEGATIVE NEGATIVE Final   C Diff interpretation No C. difficile detected.  Final    Comment: Performed at [facility], [address] [place], [place] [address]   Today   [place] today has no new complaints -Husband at bedside, husband states that patient is back to baseline  -Had mushy stools          Patient has been seen and examined prior to discharge   Objective   Blood pressure 122/82, pulse 96, temperature 98.5 F (36.9 C), temperature source Oral, resp. rate (!) 41, height _0  (1.702 m), weight 72.1 kg, SpO2 99 %.   Intake/Output Summary (Last 24 hours) at 12/30/2018 1417 Last data filed at 12/30/2018 1004 Gross per 24 hour  Intake 535.79 ml  Output 1100 ml  Net -564.21 ml   Exam Gen:- Awake Alert, no acute distress , not talking much  HEENT:- [place].AT, No sclera icterus Neck-Supple Neck,No JVD,.  Lungs-  CTAB , good air movement bilaterally  CV- S1, S2 normal, regular Abd-  +ve B.Sounds, Abd Soft, No tenderness,    Extremity/Skin:- No  edema,   good pulses Psych-affect is flat with cognitive deficits,  Neuro-able to ambulate from bed to chair with assistance ,no new focal deficits, no tremors --as per patient's husband who is at bedside patient appears back to baseline   Data Review   CBC w Diff:  Lab Results  Component Value Date   WBC 6.7 12/30/2018   HGB 13.7 12/30/2018   HCT 41.0 12/30/2018   PLT 246 12/30/2018   LYMPHOPCT 10 12/28/2018   MONOPCT 6 12/28/2018   EOSPCT 0 12/28/2018   BASOPCT 0 12/28/2018   CMP:  Lab Results  Component Value Date    NA 146 (H) 12/30/2018   K 3.2 (L) 12/30/2018   CL 110 12/30/2018   CO2 25 12/30/2018   BUN 5 (L) 12/30/2018   CREATININE 0.52 12/30/2018   GLU 210 02/05/2017   PROT 7.8 12/28/2018   ALBUMIN 4.4 12/28/2018   BILITOT 1.3 (H) 12/28/2018   ALKPHOS 64 12/28/2018   AST 38 12/28/2018   ALT 30 12/28/2018  . Total Discharge time is about 33 minutes  Roxan Hockey M.D on 12/30/2018 at 2:17 PM  Go to [email or web address] -  for contact info  [facility] - Office  [phone]

## 2018-12-30 NOTE — Progress Notes (Signed)
DME choices:   [facility] [address] [place], [place]  [place]  [place], [place] [address] [phone]  [place], [place] [address] [phone]  Home Health Choices :  Viewing 1 - 14 of 14 results FirstPreviousNextLast Results list table [place] InformationSorted ascending, Select to sort descending  Quality of Patient Care RatingSelect to sort ascending or descending Quality of Patient Care Rating Contextual Help  Patient Survey Summary RatingSelect to sort ascending or descending QPatient Survey Summary [place] [phone]  [place] my Favorites Quality of Patient Care Rating 3 out of 5 stars Patient Survey Summary Rating 4 out of [place] [phone]  [place] my Favorites Quality of Patient Care Rating 3  out of 5 stars Patient Survey Summary Rating 4 out of [place] [phone]  [place] my Favorites Quality of Patient Care Rating 4 out of 5 stars Patient Survey Summary Rating 4 out of [place] [phone]  [place] my Favorites Quality of Patient Care Rating 3 out of 5 stars Patient Survey Summary Rating 5 out of [place] [phone]  Add [facility] my Favorites Quality of Patient Care Rating 4  out of 5 stars Patient Survey Summary Rating 3 out of 5 stars [place] [phone]) [phone]  Add [facility] my Favorites Quality of Patient Care Rating 4 out of 5 stars Patient Survey Summary Rating 4 out of 5 stars [place] [phone]  [place], [facility]to my Favorites Quality of Patient Care Rating 4 out of 5 stars Patient Survey Summary Rating 4 out of 5 stars [place] [phone]  [place] my Favorites Quality of Patient Care Rating 4 out of 5 stars Patient Survey Summary Rating 4 out of 5 stars [place] AGE [phone]  [place] my Favorites Quality of Patient Care Rating 3 out of 5 stars Patient Survey Summary Rating 3 out of 5 stars [facility] [place] [phone]  Add [facility] [place] my Favorites Quality of Patient Care Rating 3  out of 5 stars Patient Survey Summary Rating 4 out of 5 stars [place] [phone]  [place] my Favorites Quality of Patient Care Rating 3 out of 5 stars Patient Survey Summary Rating 4 out of 5 stars [facility] (336) [phone]  Add [facility] my Favorites Quality of Patient Care Rating 3  out of 5 stars Patient Survey Summary Rating 3 out of 5 stars [place] [phone]  Add [place] my Favorites Quality of Patient Care Rating 3  out of 5 stars Not [place]

## 2018-12-30 NOTE — Care Management Important Message (Signed)
Important Message  Patient Details  Name: Monica Ayers MRN: [id number] Date of Birth: [birth year]   Medicare Important Message Given:  Yes     [name] 12/30/2018, 2:13 PM

## 2018-12-30 NOTE — TOC Transition Note (Signed)
Transition of Care [facility]) - CM/SW Discharge Note   Patient Details  Name: Monica Ayers MRN: [id number] Date of Birth: 12/22/1960  Transition of Care [facility]) CM/SW Contact:  Boneta Lucks, RN Phone Number: 12/30/2018, 2:56 PM   Clinical Narrative:   Patient is discharging home with Home Health referred to Tim with [facility], they had used the past and requested again. Orders for PT/SW.  Per PT patient needs a rolling walker. Referred to Blake Divine with [facility]. She will deliver to the home.  CM called Jeneen Rinks to updated him on discharge planning.     Final next level of care: [place] Barriers to Discharge: No Barriers Identified   Patient Goals and CMS Choice Patient states their goals for this hospitalization and ongoing recovery are:: to go back home. CMS Medicare.gov Compare Post Acute Care list provided to:: Patient Represenative (must comment)(james) Choice offered to / list presented to : Spouse  Discharge Placement                  Name of family member notified: Jeneen Rinks Patient and family notified of of transfer: 12/30/18  Discharge Plan and Services     Post Acute Care Choice: Home Health          DME Arranged: Gilford Rile rolling DME Agency: [facility] Date DME Agency Contacted: 12/30/18 Time DME Agency Contacted: 2500 Representative spoke with at DME Agency: [place]: Social Work, PT [place] Agency: [facility] [employer] [facility] [employer] Date [place]: 12/30/18 Time [place]: 1436 Representative spoke with at [place]: [place] ([place]) Interventions     Readmission Risk Interventions Readmission Risk Prevention Plan 11/20/2018  Post Dischage Appt Complete  Medication Screening Complete  Transportation Screening Complete

## 2018-12-30 NOTE — Discharge Instructions (Signed)
1) check your blood pressure daily--- keep a diary/record of your blood pressure and have the primary care physician adjust your blood pressure medications accordingly 2) recheck TSH/thyroid test in 4 to 6 weeks

## 2018-12-30 NOTE — Progress Notes (Signed)
Home Health and DME choices:   [facility] [phone]  [place] my Favorites Quality of Patient Care Rating 3 out of 5 stars Patient Survey Summary Rating 4 out of [place] [phone]  [place] my Favorites Quality of Patient Care Rating 3  out of 5 stars Patient Survey Summary Rating 4 out of [place] [phone]  [place] my Favorites Quality of Patient Care Rating 4 out of 5 stars Patient Survey Summary Rating 4 out of [place] [phone]  [place] my Favorites Quality of Patient Care Rating 3 out of 5 stars Patient Survey Summary Rating 5 out of [place] [phone]) [phone]  Add [facility] my Favorites Quality of Patient Care Rating 4  out of 5 stars Patient Survey Summary Rating 3 out of 5 stars [place] [phone]) [phone]  Add [facility]to my Favorites Quality of Patient Care Rating 4 out of 5 stars Patient Survey Summary Rating 4 out of 5 stars [place] [phone]  [place], INCto my Favorites Quality of Patient Care Rating 4 out of 5 stars Patient Survey Summary Rating 4 out of 5 stars [place] [phone]  [place] my Favorites Quality of Patient Care Rating 4 out of 5 stars Patient Survey Summary Rating 4 out of 5 stars [place] AGE [phone]  [place] my Favorites Quality of Patient Care Rating 3 out of 5 stars Patient Survey Summary Rating 3 out of 5 stars [facility] [place] [phone]  Add [facility] [place] my Favorites Quality of Patient Care Rating 3  out of 5 stars Patient Survey Summary Rating 4 out of 5 stars [place] [phone]  [place] my Favorites Quality of Patient Care Rating 3 out of 5 stars Patient Survey Summary Rating 4 out of 5 stars [facility] ([phone]) [phone]  Add [facility] my Favorites Quality of Patient Care Rating 3  out of 5 stars Patient Survey Summary Rating 3 out of 5 stars [place] [phone]  Add [facility] my Favorites Quality of Patient Care Rating 3  out of 5 stars Not [place]    DME

## 2019-01-01 LAB — GASTROINTESTINAL PANEL BY PCR, STOOL (REPLACES STOOL CULTURE)

## 2019-01-02 ENCOUNTER — Telehealth: Payer: Self-pay

## 2019-01-02 ENCOUNTER — Telehealth: Payer: Self-pay | Admitting: Family Medicine

## 2019-01-02 LAB — CULTURE, BLOOD (ROUTINE X 2)
Culture: NO GROWTH
Culture: NO GROWTH
Special Requests: ADEQUATE
Special Requests: ADEQUATE

## 2019-01-02 NOTE — Telephone Encounter (Signed)
Transition Care Management Follow-up Telephone Call   Date discharged? 8 /28/2020               How have you been since you were released from the hospital? a whole lot better. She is walking getting up and sitting up   Do you understand why you were in the hospital? sepsis   Do you understand the discharge instructions? yes   Where were you discharged to? home   Items Reviewed:  Medications reviewed: yes  Allergies reviewed: yes  Dietary changes reviewed: yes  Referrals reviewed: yes home health   Functional Questionnaire:   Activities of Daily Living (ADLs):  needs help but can do what she normally does    Any transportation issues/concerns?: no   Any patient concerns? no   Confirmed importance and date/time of follow-up visits scheduled 9/8 at 2pm     Confirmed with patient if condition begins to worsen call PCP or go to the ER.  Patient was given the office number and encouraged to call back with question or concerns. Yes with verbal understanding

## 2019-01-02 NOTE — Telephone Encounter (Signed)
Spoke with Verdis Frederickson and gave verbal orders to start  therapy

## 2019-01-02 NOTE — Telephone Encounter (Signed)
Verbal orders to start therapy this week

## 2019-01-03 ENCOUNTER — Telehealth: Payer: Self-pay | Admitting: Family Medicine

## 2019-01-03 NOTE — Telephone Encounter (Signed)
Spoke with Kathleen and gave verbal orders. 

## 2019-01-03 NOTE — Telephone Encounter (Signed)
Needs Verbal orders for PT  2 wk 2 1 wk 4  And OT eval

## 2019-01-04 ENCOUNTER — Telehealth: Payer: Self-pay | Admitting: *Deleted

## 2019-01-04 NOTE — Telephone Encounter (Signed)
FYI

## 2019-01-04 NOTE — Telephone Encounter (Signed)
Melissa with [facility] requesting orders for Speech therapy on Monica Ayers due to receptive and expressive aphasia. She can be reached at [phone]

## 2019-01-04 NOTE — Telephone Encounter (Signed)
Monica Ayers with [facility] called and said she evaluated Monica Ayers for OT and she has no OT needs at this time. She can be reached at [phone]

## 2019-01-05 NOTE — Telephone Encounter (Signed)
Called and left vm on identified vm giving verbal orders.

## 2019-01-10 ENCOUNTER — Ambulatory Visit (INDEPENDENT_AMBULATORY_CARE_PROVIDER_SITE_OTHER): Payer: Medicare Other | Admitting: Family Medicine

## 2019-01-10 ENCOUNTER — Encounter: Payer: Self-pay | Admitting: Family Medicine

## 2019-01-10 ENCOUNTER — Other Ambulatory Visit: Payer: Self-pay

## 2019-01-10 DIAGNOSIS — Z9189 Other specified personal risk factors, not elsewhere classified: Secondary | ICD-10-CM

## 2019-01-10 DIAGNOSIS — E119 Type 2 diabetes mellitus without complications: Secondary | ICD-10-CM | POA: Diagnosis not present

## 2019-01-10 DIAGNOSIS — E0590 Thyrotoxicosis, unspecified without thyrotoxic crisis or storm: Secondary | ICD-10-CM

## 2019-01-10 DIAGNOSIS — E876 Hypokalemia: Secondary | ICD-10-CM

## 2019-01-10 DIAGNOSIS — R2681 Unsteadiness on feet: Secondary | ICD-10-CM

## 2019-01-10 DIAGNOSIS — IMO0001 Reserved for inherently not codable concepts without codable children: Secondary | ICD-10-CM

## 2019-01-10 MED ORDER — METFORMIN HCL 500 MG PO TABS: 500.0000 mg | ORAL_TABLET | Freq: Every day | ORAL | 11 refills | Status: DC

## 2019-01-10 NOTE — Progress Notes (Signed)
Virtual Visit via Telephone Note   This visit type was conducted due to national recommendations for restrictions regarding the COVID-19 Pandemic (e.g. social distancing) in an effort to limit this patient's exposure and mitigate transmission in our community.  Due to her co-morbid illnesses, this patient is at least at moderate risk for complications without adequate follow up.  This format is felt to be most appropriate for this patient at this time.  The patient did not have access to video technology/had technical difficulties with video requiring transitioning to audio format only (telephone).  All issues noted in this document were discussed and addressed.  No physical exam could be performed with this format.    Evaluation Performed:  Follow-up visit  Date:  01/10/2019   ID:  Monica Ayers, DOB 04-12-1961, MRN [id number]  Patient Location: Home Provider Location: Office  Location of Patient: Home Location of Provider: Telehealth Consent was obtain for visit to be over via telehealth. I verified that I am speaking with the correct person using two identifiers.  PCP:  Freddy Finner, NP   Chief Complaint:  TOC  History of Present Illness:    Monica Ayers is a 58 y.o. female with history significant for type 2 diabetes, hypertension, dementia, vitamin D deficiency,and seasonal allergies.   Here today for TOC secondary to me sending her to the emergency room after she had a temperature and heart rate elevation along with EKG demonstrating elevated heart rate as well.  Husband also reported malodorous urine.  She was lethargic and not herself at the time I had seen her previously and could recognize that she was not feeling like herself.  She remains nonverbal and her husband speaks for her today over the phone as well.    ED course: Admitted after finding hypernatremic, severe hypokalemia, elevated glucose, AK I with creatinine up, ion gap elevation, elevated total  bilirubin, elevated lactic acid, elevated white blood cell count, low TSH. Additionally CT and MRI were clear of anything significant outside of previous changes.  Chest x-ray was clear.  EKG did demonstrate tachycardia 125 with prolonged QT.  Hospital admission: Patient was already recently admitted for the month prior secondary to progressive worsening mental status.  This admission she was admitted for possible sepsis, SIRS with a fever.  Hyponatremia, hypokalemia.  Hypertension, abnormal TSH.  She was hydrated, provided with IV antibiotics.  Blood cultures were negative.  Urine was negative.  Chest x-ray was negative as well.  CT of the head without new acute findings.  Stool was negative for C. difficile as well.  Retrospective sepsis diagnosis was not confirmed.  She was restarted on her metformin.  Potassium and sodium had improved.  High blood pressure medication was restarted and stable.  Kidney function was better.  She was advised to follow-up with her primary care doctor.  To have her thyroid levels checked again.   The patient does not have symptoms concerning for COVID-19 infection (fever, chills, cough, or new shortness of breath).   Past Medical, Surgical, Social History, Allergies, and Medications have been Reviewed.  Past Medical History:  Diagnosis Date   Allergy    Dementia (HCC)    Diabetes mellitus without complication (HCC)    Hypertension    Past Surgical History:  Procedure Laterality Date   FRACTURE SURGERY     KNEE SURGERY Right    NECK SURGERY     SHOULDER SURGERY Left      Current Meds  Medication Sig   acetaminophen (TYLENOL) 325 MG tablet Take 2 tablets (650 mg total) by mouth every 6 (six) hours as needed for mild pain, fever or headache (or Fever >/= 101).   amLODipine (NORVASC) 2.5 MG tablet Take 1 tablet (2.5 mg total) by mouth daily. For BP   aspirin 325 MG EC tablet Take 1 tablet (325 mg total) by mouth daily with breakfast.    lisinopril (ZESTRIL) 10 MG tablet Take 1 tablet (10 mg total) by mouth daily. For BP   LORazepam (ATIVAN) 0.5 MG tablet Take 1 tablet (0.5 mg total) by mouth daily as needed for anxiety. Take 30-1 hour prior to needing shower   metFORMIN (GLUCOPHAGE) 500 MG tablet Take 500 mg by mouth daily.    Vitamin D, Ergocalciferol, (DRISDOL) 1.25 MG (50000 UT) CAPS capsule Take 1 capsule (50,000 Units total) by mouth every 7 (seven) days.     Allergies:   Latex   Social History   Tobacco Use   Smoking status: Former Smoker    Types: Cigarettes   Smokeless tobacco: Never Used  Substance Use Topics   Alcohol use: No   Drug use: No     Family Hx: The patient's family history is not on file.  ROS:   Please see the history of present illness.    All other systems reviewed and are negative.   Labs/Other Tests and Data Reviewed:    Recent Labs: 12/28/2018: ALT 30; TSH 0.021 12/30/2018: BUN 5; Creatinine, Ser 0.52; Hemoglobin 13.7; Magnesium 2.2; Platelets 246; Potassium 3.2; Sodium 146   Recent Lipid Panel Lab Results  Component Value Date/Time   CHOL 184 11/18/2018 08:00 AM   TRIG 195 (H) 11/18/2018 08:00 AM   HDL 34 (L) 11/18/2018 08:00 AM   CHOLHDL 5.4 11/18/2018 08:00 AM   LDLCALC 111 (H) 11/18/2018 08:00 AM    Wt Readings from Last 3 Encounters:  12/30/18 158 lb 15.2 oz (72.1 kg)  12/12/18 162 lb 0.6 oz (73.5 kg)  11/18/18 171 lb 11.8 oz (77.9 kg)     Objective:    Vital Signs:  There were no vitals taken for this visit.   VITAL SIGNS:  reviewed Patient is nonverbal.  Husband is the historian today on the telephone.  ASSESSMENT & PLAN:    1. Transition of care performed with sharing of clinical summary As demonstrated above.  Will be getting rechecks of her thyroid, kidney function, electrolytes.  Will be bringing her back into the office in the next few weeks as well for an in person assessment and her flu shot.  2. Type 2 diabetes mellitus without complication,  without long-term current use of insulin (HCC) Monica Ayers is encouraged to check blood sugar daily as directed. Continue current medications.  Her husband is reminded the importance of maintaining  good blood sugars,  taking medications as directed, daily foot care, annual eye exams. Additionally educated about keeping good control over blood pressure and cholesterol as well.  Needs flu when she comes back in.  - metFORMIN (GLUCOPHAGE) 500 MG tablet; Take 1 tablet (500 mg total) by mouth daily.  Dispense: 30 tablet; Refill: 11 - CBC  3 Hyperthyroidism Labs in hospital demonstrated low TSH with elevated T4. Will be rechecking to assess in more detail for medication/treatment needs.  - TSH - T4, free - Parathyroid hormone, intact (no Ca)  4. Hypokalemia Will need to repeat CMT to check k levels  - COMPLETE METABOLIC PANEL WITH GFR  5. Hypocalcemia  Low Ca, might be PTH related, will be checking to r/o And repeating CMT   - Parathyroid hormone, intact (no Ca) - COMPLETE METABOLIC PANEL WITH GFR  6. Unsteady gait Continuing to work on wheelchair with [employer], paperwork was submitted earlier.    Time:   Today, I have spent 10 minutes with the patient with telehealth technology discussing the above problems.     Medication Adjustments/Labs and Tests Ordered: Current medicines are reviewed at length with the patient today.  Concerns regarding medicines are outlined above.   Tests Ordered: No orders of the defined types were placed in this encounter.   Medication Changes: No orders of the defined types were placed in this encounter.   Disposition:  Follow up 2 weeks   Signed, Perlie Mayo, NP  01/10/2019 2:37 PM     [facility]

## 2019-01-10 NOTE — Patient Instructions (Addendum)
    Thank you for c completing your visit via telephone. I appreciate the opportunity to provide you with the care for your health and wellness. Today we discussed:   Follow Up: 2 weeks in office  Labs as soon as you can get them- I have placed them in the computer  Please continue to practice social distancing to keep you, your family, and our community safe.  If you must go out, please wear a Mask and practice good handwashing.  [place] YOUR HANDS WELL AND FREQUENTLY. AVOID TOUCHING YOUR FACE, UNLESS YOUR HANDS ARE FRESHLY WASHED.  GET FRESH AIR DAILY. STAY HYDRATED WITH WATER.   It was a pleasure to see you and I look forward to continuing to work together on your health and well-being. Please do not hesitate to call the office if you need care or have questions about your care.  Have a wonderful day and week. With Gratitude, Cherly Beach, DNP, AGNP-BC

## 2019-01-17 LAB — COMPLETE METABOLIC PANEL WITHOUT GFR
AG Ratio: 1.5 (calc) (ref 1.0–2.5)
ALT: 21 U/L (ref 6–29)
AST: 27 U/L (ref 10–35)
Albumin: 4.2 g/dL (ref 3.6–5.1)
Alkaline phosphatase (APISO): 68 U/L (ref 37–153)
BUN: 10 mg/dL (ref 7–25)
CO2: 28 mmol/L (ref 20–32)
Calcium: 9.7 mg/dL (ref 8.6–10.4)
Chloride: 102 mmol/L (ref 98–110)
Creat: 0.62 mg/dL (ref 0.50–1.05)
GFR, Est African American: 115 mL/min/1.73m2
GFR, Est Non African American: 99 mL/min/1.73m2
Globulin: 2.8 g/dL (ref 1.9–3.7)
Glucose, Bld: 153 mg/dL — ABNORMAL HIGH (ref 65–139)
Potassium: 3.2 mmol/L — ABNORMAL LOW (ref 3.5–5.3)
Sodium: 145 mmol/L (ref 135–146)
Total Bilirubin: 1 mg/dL (ref 0.2–1.2)
Total Protein: 7 g/dL (ref 6.1–8.1)

## 2019-01-17 LAB — CBC
HCT: 44.3 % (ref 35.0–45.0)
Hemoglobin: 14.9 g/dL (ref 11.7–15.5)
MCH: 28.6 pg (ref 27.0–33.0)
MCHC: 33.6 g/dL (ref 32.0–36.0)
MCV: 85 fL (ref 80.0–100.0)
MPV: 8.7 fL (ref 7.5–12.5)
Platelets: 354 Thousand/uL (ref 140–400)
RBC: 5.21 Million/uL — ABNORMAL HIGH (ref 3.80–5.10)
RDW: 14.2 % (ref 11.0–15.0)
WBC: 7 Thousand/uL (ref 3.8–10.8)

## 2019-01-17 LAB — TEST AUTHORIZATION

## 2019-01-17 LAB — PARATHYROID HORMONE, INTACT (NO CA): PTH: 35 pg/mL (ref 14–64)

## 2019-01-17 LAB — TSH: TSH: 0.02 mIU/L — ABNORMAL LOW (ref 0.40–4.50)

## 2019-01-17 LAB — T3, FREE: T3, Free: 3.5 pg/mL (ref 2.3–4.2)

## 2019-01-17 LAB — T4, FREE: Free T4: 1.3 ng/dL (ref 0.8–1.8)

## 2019-01-18 ENCOUNTER — Other Ambulatory Visit: Payer: Self-pay | Admitting: Family Medicine

## 2019-01-18 DIAGNOSIS — E876 Hypokalemia: Secondary | ICD-10-CM

## 2019-01-18 DIAGNOSIS — E0590 Thyrotoxicosis, unspecified without thyrotoxic crisis or storm: Secondary | ICD-10-CM | POA: Insufficient documentation

## 2019-01-18 MED ORDER — POTASSIUM CHLORIDE ER 10 MEQ PO TBCR: 10.0000 meq | EXTENDED_RELEASE_TABLET | Freq: Every day | ORAL | 0 refills | Status: DC

## 2019-01-18 NOTE — Progress Notes (Signed)
Current levels of thyroid hormone suggest possible hyperthyroidism, (subclincial). I think it would be best to do a referral to Dr Dorris Fetch for further work up and verification of management. I will place this referral today.  She would benefit from continued potassium, as her level is still on the low end. Please ask if they need a refill of this.

## 2019-01-24 ENCOUNTER — Ambulatory Visit (INDEPENDENT_AMBULATORY_CARE_PROVIDER_SITE_OTHER): Payer: Medicare Other | Admitting: Family Medicine

## 2019-01-24 ENCOUNTER — Other Ambulatory Visit: Payer: Self-pay

## 2019-01-24 ENCOUNTER — Encounter: Payer: Self-pay | Admitting: Family Medicine

## 2019-01-24 VITALS — BP 131/89 | HR 123 | Temp 98.3°F | Resp 14 | Ht 67.0 in | Wt 152.0 lb

## 2019-01-24 DIAGNOSIS — I10 Essential (primary) hypertension: Secondary | ICD-10-CM

## 2019-01-24 DIAGNOSIS — G3109 Other frontotemporal dementia: Secondary | ICD-10-CM | POA: Diagnosis not present

## 2019-01-24 DIAGNOSIS — F0280 Dementia in other diseases classified elsewhere without behavioral disturbance: Secondary | ICD-10-CM

## 2019-01-24 DIAGNOSIS — R000 Tachycardia, unspecified: Secondary | ICD-10-CM

## 2019-01-24 DIAGNOSIS — Z23 Encounter for immunization: Secondary | ICD-10-CM

## 2019-01-24 DIAGNOSIS — Z1159 Encounter for screening for other viral diseases: Secondary | ICD-10-CM | POA: Diagnosis not present

## 2019-01-24 MED ORDER — AMLODIPINE BESYLATE 5 MG PO TABS: 5.0000 mg | ORAL_TABLET | Freq: Every day | ORAL | 1 refills | Status: DC

## 2019-01-24 MED ORDER — LISINOPRIL 10 MG PO TABS: 10.0000 mg | ORAL_TABLET | Freq: Every day | ORAL | 2 refills | Status: DC

## 2019-01-24 NOTE — Patient Instructions (Addendum)
    Thank you for coming into the office today. I appreciate the opportunity to provide you with the care for your health and wellness. Today we discussed: BP, heart rate, and overall health  Follow up: 2 months  No labs or referrals today- will wait to see what your other doctors order  I think you might need something for heart rate control, but will wait to see what Cardiologist says.  Also, I did go ahead and increase one of your BP meds. You will start taking 5 mg of Norvasc now.  Flu vaccine today-tylenol if arm gets sore.  Please continue to practice social distancing to keep you, your family, and our community safe.  If you must go out, please wear a Mask and practice good handwashing.  [place] YOUR HANDS WELL AND FREQUENTLY. AVOID TOUCHING YOUR FACE, UNLESS YOUR HANDS ARE FRESHLY WASHED.  GET FRESH AIR DAILY. STAY HYDRATED WITH WATER.   It was a pleasure to see you and I look forward to continuing to work together on your health and well-being. Please do not hesitate to call the office if you need care or have questions about your care.  Have a wonderful day and week. With Gratitude, Cherly Beach, DNP, AGNP-BC

## 2019-01-24 NOTE — Progress Notes (Signed)
Subjective:     Patient ID: Monica Ayers, female   DOB: Feb 17, 1961, 58 y.o.   MRN: [id number]  Monica Ayers presents for Hypertension New medications were started in the hospital.  Blood pressure still remains elevated.  Brought in for blood pressure check in person.  Very picky at eating.  Husband works to give her healthy foods but tries to let her eat what she wants to eat.  To that she will eat.  Additionally current thyroid levels of hormone suggest subclinical hyperthyroidism.  She was referred to Dr. Fransico Him.  For additional work-up and management.  Her potassium continued to be on the lower end.  She was re-started on 10 mEq of potassium daily.  She also has a appointment with cardiology coming up Dr. Gerilyn Pilgrim would like her to have an event monitor.  Overall husband says that he see nothing but improvement since she has been out of hospital.  He is very pleased with how she is communicating and talking and moving around.  She gets up and walks around he does a half to try to entice her.  She is much more willing to eat she takes her medications okay.  There are no complaints today in the office.   Today patient denies signs and symptoms of COVID 19 infection including fever, chills, cough, shortness of breath, and headache. Past Medical, Surgical, Social History, Allergies, and Medications have been Reviewed.   Past Medical History:  Diagnosis Date  . Allergy   . Dementia (HCC)   . Diabetes mellitus without complication (HCC)   . Hypertension    Past Surgical History:  Procedure Laterality Date  . FRACTURE SURGERY    . KNEE SURGERY Right   . NECK SURGERY    . SHOULDER SURGERY Left    Social History   Socioeconomic History  . Marital status: Married    Spouse name: Not on file  . Number of children: Not on file  . Years of education: Not on file  . Highest education level: Not on file  Occupational History  . Not on file  Social Needs  . Financial resource  strain: Not on file  . Food insecurity    Worry: Not on file    Inability: Not on file  . Transportation needs    Medical: Not on file    Non-medical: Not on file  Tobacco Use  . Smoking status: Former Smoker    Types: Cigarettes  . Smokeless tobacco: Never Used  Substance and Sexual Activity  . Alcohol use: No  . Drug use: No  . Sexual activity: Not on file  Lifestyle  . Physical activity    Days per week: Not on file    Minutes per session: Not on file  . Stress: Not on file  Relationships  . Social Musician on phone: Not on file    Gets together: Not on file    Attends religious service: Not on file    Active member of club or organization: Not on file    Attends meetings of clubs or organizations: Not on file    Relationship status: Not on file  . Intimate partner violence    Fear of current or ex partner: Not on file    Emotionally abused: Not on file    Physically abused: Not on file    Forced sexual activity: Not on file  Other Topics Concern  . Not on file  Social History  Narrative  . Not on file    Outpatient Encounter Medications as of 01/24/2019  Medication Sig  . acetaminophen (TYLENOL) 325 MG tablet Take 2 tablets (650 mg total) by mouth every 6 (six) hours as needed for mild pain, fever or headache (or Fever >/= 101).  Marland Kitchen amLODipine (NORVASC) 5 MG tablet Take 1 tablet (5 mg total) by mouth daily. For BP  . aspirin 325 MG EC tablet Take 1 tablet (325 mg total) by mouth daily with breakfast.  . atorvastatin (LIPITOR) 40 MG tablet Take 1 tablet (40 mg total) by mouth daily at 6 PM.  . lisinopril (ZESTRIL) 10 MG tablet Take 1 tablet (10 mg total) by mouth daily. For BP  . LORazepam (ATIVAN) 0.5 MG tablet Take 1 tablet (0.5 mg total) by mouth daily as needed for anxiety. Take 30-1 hour prior to needing shower  . metFORMIN (GLUCOPHAGE) 500 MG tablet Take 1 tablet (500 mg total) by mouth daily.  . potassium chloride (K-DUR) 10 MEQ tablet Take 1 tablet  (10 mEq total) by mouth daily.  . Vitamin D, Ergocalciferol, (DRISDOL) 1.25 MG (50000 UT) CAPS capsule Take 1 capsule (50,000 Units total) by mouth every 7 (seven) days.  . [DISCONTINUED] amLODipine (NORVASC) 2.5 MG tablet Take 1 tablet (2.5 mg total) by mouth daily. For BP   No facility-administered encounter medications on file as of 01/24/2019.    Allergies  Allergen Reactions  . Latex Rash    Review of Systems  Constitutional: Negative.   HENT: Negative.   Eyes: Negative.   Respiratory: Negative.   Cardiovascular: Negative.   Gastrointestinal: Negative.   Endocrine: Negative.   Genitourinary: Negative.   Musculoskeletal: Negative.   Skin: Negative.   Allergic/Immunologic: Negative.   Neurological: Negative.   Hematological: Negative.   Psychiatric/Behavioral: Negative.        Objective:     BP 131/89   Pulse (!) 123   Temp 98.3 F (36.8 C) (Oral)   Resp 14   Ht 5\' 7"  (1.702 m)   Wt 152 lb (68.9 kg)   SpO2 98%   BMI 23.81 kg/m   Physical Exam Vitals signs and nursing note reviewed.  Constitutional:      Appearance: Normal appearance. She is well-developed and well-groomed.  HENT:     Head: Normocephalic and atraumatic.     Right Ear: External ear normal.     Left Ear: External ear normal.     Nose: Nose normal.     Mouth/Throat:     Mouth: Mucous membranes are moist.     Pharynx: Oropharynx is clear.  Eyes:     General:        Right eye: No discharge.        Left eye: No discharge.     Conjunctiva/sclera: Conjunctivae normal.  Neck:     Musculoskeletal: Normal range of motion and neck supple.  Cardiovascular:     Rate and Rhythm: Regular rhythm. Tachycardia present.     Pulses: Normal pulses.     Heart sounds: Normal heart sounds.  Pulmonary:     Effort: Pulmonary effort is normal.     Breath sounds: Normal breath sounds.  Musculoskeletal: Normal range of motion.  Skin:    General: Skin is warm.  Neurological:     General: No focal deficit  present.     Mental Status: She is alert and oriented to person, place, and time.  Psychiatric:        Attention and Perception: Attention  normal.        Mood and Affect: Mood normal.        Speech: Speech is delayed.        Behavior: Behavior is cooperative.        Cognition and Memory: Cognition is impaired. Memory is impaired.        Assessment and Plan              1. Frontal lobe dementia (HCC) Tremendous improvement in mentation and communication today.  Still has impaired cognition and memory.  Delayed noncommunicative speech at times.  Very cooperative behavior.  Normal mood.  Not as withdrawn as she was when she was septic and I sent her to the emergency room.  2. Essential hypertension Blood pressure still slightly uncontrolled.  Given other comorbidities would like to try to get this little bit better in control will increase Norvasc from 2.5 mg to 5 milligrams.  Husband and patient appears to be okay with the situation advised that cardiologist might want to change that.  But for now I think it would be a good idea.   Reviewed side effects, risks and benefits of medication.    - amLODipine (NORVASC) 5 MG tablet; Take 1 tablet (5 mg total) by mouth daily. For BP  Dispense: 30 tablet; Refill: 1  3. Tachycardia Ongoing tachycardia is been to be followed up for subclinical hyperthyroidism additionally is can be following up with cardiology to help possibly get a monitor.  At this time unsure if should be started on beta-blocker will wait for consultations to be completed.  Appreciate collaboration in her care.  If anything is needed from PCP please do not hesitate.  4. Encounter for hepatitis C screening test for low risk patient Task force recommendation  - HEP C AB W/REFL  5. Need for immunization against influenza Patient was educated on the recommendation for flu vaccine. After obtaining informed consent, the vaccine was administered no adverse effects noted at time of  administration. Patient provided with education on arm soreness and use of tylenol or ibuprofen (if safe) for this. Encourage to use the arm vaccine was given in to help reduce the soreness. Patient educated on the signs of a reaction to the vaccine and advised to contact the office should these occur.    - Flu Vaccine QUAD 36+ mos IM   Follow Up: 03/28/2019  Freddy Finner, DNP, AGNP-BC [facility] [address] [place], [place] [address] Office Hours: Mon-Thurs 8 am-5 pm; Fri 8 am-12 pm Office Phone:  [phone]  Office Fax: [phone]

## 2019-01-26 ENCOUNTER — Encounter: Payer: Self-pay | Admitting: Cardiovascular Disease

## 2019-01-26 ENCOUNTER — Ambulatory Visit (INDEPENDENT_AMBULATORY_CARE_PROVIDER_SITE_OTHER): Payer: Medicare Other | Admitting: Cardiovascular Disease

## 2019-01-26 VITALS — BP 130/80 | HR 123 | Ht 67.0 in | Wt 152.0 lb

## 2019-01-26 DIAGNOSIS — E785 Hyperlipidemia, unspecified: Secondary | ICD-10-CM

## 2019-01-26 DIAGNOSIS — E0590 Thyrotoxicosis, unspecified without thyrotoxic crisis or storm: Secondary | ICD-10-CM | POA: Diagnosis not present

## 2019-01-26 DIAGNOSIS — I639 Cerebral infarction, unspecified: Secondary | ICD-10-CM

## 2019-01-26 DIAGNOSIS — I10 Essential (primary) hypertension: Secondary | ICD-10-CM

## 2019-01-26 NOTE — Patient Instructions (Signed)
Medication Instructions:  Continue all current medications.  Labwork: none  Testing/Procedures:  Your physician has recommended that you wear a 30 day event monitor. Event monitors are medical devices that record the heart's electrical activity. Doctors most often us these monitors to diagnose arrhythmias. Arrhythmias are problems with the speed or rhythm of the heartbeat. The monitor is a small, portable device. You can wear one while you do your normal daily activities. This is usually used to diagnose what is causing palpitations/syncope (passing out).  Office will contact with results via phone or letter.    Follow-Up: To be determined   Any Other Special Instructions Will Be Listed Below (If Applicable).  If you need a refill on your cardiac medications before your next appointment, please call your pharmacy.  

## 2019-01-26 NOTE — Progress Notes (Signed)
CARDIOLOGY CONSULT NOTE  Patient ID: Monica Ayers MRN: [id number] DOB/AGE: 10-03-1960 58 y.o.  Admit date: (Not on file) Primary Physician: Freddy Finner, NP Referring Physician: Beryle Beams, MD.   Reason for Consultation: Event monitor  HPI: Monica Ayers is a 58 y.o. female who is being seen today for the evaluation of CVA at the request of Beryle Beams, MD.   I reviewed notes from her neurologist dated 12/26/2018.  Brain MRI showed bilateral small frontal infarcts.  There was mild stenosis of bilateral internal carotid arteries.  Echocardiogram demonstrated normal LV systolic function, EF 60 to 65%.  There was mild thickening of the mitral and aortic valve leaflets.  There is mild calcification as well.  She has been referred today to evaluate for an arrhythmia as the cause of her CVA.  A 30-day event monitor was requested.  She was hospitalized in August with fevers, tachycardia, and elevated lactic acid with worsening mental status.  It was felt she may have had sepsis but this was not confirmed as per discharge summary.  She was given IV fluids and initially treated with vancomycin and cefepime.  She is here with her husband.  He said she is doing much better.  She is walking with a walker.  She denies chest pain, palpitations, and shortness of breath.  She quit smoking 5 months ago.  TSH 0.02 on 01/16/2019.  She is scheduled to see endocrinology on 02/08/2019.  Free T4 was 1.3.   Allergies  Allergen Reactions  . Latex Rash    Current Outpatient Medications  Medication Sig Dispense Refill  . acetaminophen (TYLENOL) 325 MG tablet Take 2 tablets (650 mg total) by mouth every 6 (six) hours as needed for mild pain, fever or headache (or Fever >/= 101). 15 tablet 0  . amLODipine (NORVASC) 5 MG tablet Take 1 tablet (5 mg total) by mouth daily. For BP 30 tablet 1  . aspirin 325 MG EC tablet Take 1 tablet (325 mg total) by mouth daily with breakfast. 30 tablet  3  . atorvastatin (LIPITOR) 40 MG tablet Take 1 tablet (40 mg total) by mouth daily at 6 PM. 30 tablet 3  . lisinopril (ZESTRIL) 10 MG tablet Take 1 tablet (10 mg total) by mouth daily. For BP 30 tablet 2  . LORazepam (ATIVAN) 0.5 MG tablet Take 1 tablet (0.5 mg total) by mouth daily as needed for anxiety. Take 30-1 hour prior to needing shower 10 tablet 0  . metFORMIN (GLUCOPHAGE) 500 MG tablet Take 1 tablet (500 mg total) by mouth daily. 30 tablet 11  . potassium chloride (K-DUR) 10 MEQ tablet Take 1 tablet (10 mEq total) by mouth daily. 30 tablet 0  . Vitamin D, Ergocalciferol, (DRISDOL) 1.25 MG (50000 UT) CAPS capsule Take 1 capsule (50,000 Units total) by mouth every 7 (seven) days. 12 capsule 1   No current facility-administered medications for this visit.     Past Medical History:  Diagnosis Date  . Allergy   . Dementia (HCC)   . Diabetes mellitus without complication (HCC)   . Hypertension     Past Surgical History:  Procedure Laterality Date  . FRACTURE SURGERY    . KNEE SURGERY Right   . NECK SURGERY    . SHOULDER SURGERY Left     Social History   Socioeconomic History  . Marital status: Married    Spouse name: Not on file  . Number of children: Not on file  .  Years of education: Not on file  . Highest education level: Not on file  Occupational History  . Not on file  Social Needs  . Financial resource strain: Not on file  . Food insecurity    Worry: Not on file    Inability: Not on file  . Transportation needs    Medical: Not on file    Non-medical: Not on file  Tobacco Use  . Smoking status: Former Smoker    Types: Cigarettes  . Smokeless tobacco: Never Used  Substance and Sexual Activity  . Alcohol use: No  . Drug use: No  . Sexual activity: Not on file  Lifestyle  . Physical activity    Days per week: Not on file    Minutes per session: Not on file  . Stress: Not on file  Relationships  . Social Herbalist on phone: Not on file     Gets together: Not on file    Attends religious service: Not on file    Active member of club or organization: Not on file    Attends meetings of clubs or organizations: Not on file    Relationship status: Not on file  . Intimate partner violence    Fear of current or ex partner: Not on file    Emotionally abused: Not on file    Physically abused: Not on file    Forced sexual activity: Not on file  Other Topics Concern  . Not on file  Social History Narrative  . Not on file     No family history of premature CAD in 1st degree relatives.  Current Meds  Medication Sig  . acetaminophen (TYLENOL) 325 MG tablet Take 2 tablets (650 mg total) by mouth every 6 (six) hours as needed for mild pain, fever or headache (or Fever >/= 101).  Marland Kitchen amLODipine (NORVASC) 5 MG tablet Take 1 tablet (5 mg total) by mouth daily. For BP  . aspirin 325 MG EC tablet Take 1 tablet (325 mg total) by mouth daily with breakfast.  . atorvastatin (LIPITOR) 40 MG tablet Take 1 tablet (40 mg total) by mouth daily at 6 PM.  . lisinopril (ZESTRIL) 10 MG tablet Take 1 tablet (10 mg total) by mouth daily. For BP  . LORazepam (ATIVAN) 0.5 MG tablet Take 1 tablet (0.5 mg total) by mouth daily as needed for anxiety. Take 30-1 hour prior to needing shower  . metFORMIN (GLUCOPHAGE) 500 MG tablet Take 1 tablet (500 mg total) by mouth daily.  . potassium chloride (K-DUR) 10 MEQ tablet Take 1 tablet (10 mEq total) by mouth daily.  . Vitamin D, Ergocalciferol, (DRISDOL) 1.25 MG (50000 UT) CAPS capsule Take 1 capsule (50,000 Units total) by mouth every 7 (seven) days.      Review of systems complete and found to be negative unless listed above in HPI    Physical exam Blood pressure 130/80, pulse (!) 123, height 5\' 7"  (1.702 m), weight 152 lb (68.9 kg), SpO2 96 %. General: NAD Neck: No JVD, no thyromegaly or thyroid nodule.  Lungs: Faint intermittent expiratory wheezes bilaterally. CV: Nondisplaced PMI.  Tachycardic, regular  rhythm, normal S1/S2, no S3/S4, no murmur.  No peripheral edema.     Abdomen: Soft, nontender, no distention.  Skin: Intact without lesions or rashes.  Neurologic: Alert.  Responds to questions appropriately. Extremities: No clubbing or cyanosis.  HEENT: Normal.   ECG: I personally reviewed the ECG performed on 12/28/2018 which demonstrated sinus tachycardia, 125  bpm.   Labs: Lab Results  Component Value Date/Time   K 3.2 (L) 01/16/2019 11:12 AM   BUN 10 01/16/2019 11:12 AM   CREATININE 0.62 01/16/2019 11:12 AM   ALT 21 01/16/2019 11:12 AM   TSH 0.02 (L) 01/16/2019 11:12 AM   HGB 14.9 01/16/2019 11:12 AM     Lipids: Lab Results  Component Value Date/Time   LDLCALC 111 (H) 11/18/2018 08:00 AM   CHOL 184 11/18/2018 08:00 AM   TRIG 195 (H) 11/18/2018 08:00 AM   HDL 34 (L) 11/18/2018 08:00 AM        ASSESSMENT AND PLAN:   1.  CVA: I will proceed with 30-day event monitoring as requested by neurology to evaluate for an arrhythmic etiology such as atrial fibrillation and/or atrial flutter.  On aspirin and statin.  Having said that, she is hyperthyroid.  Therefore if she does have any arrhythmias, the treatment of hyperthyroidism would be necessary first.  2.  Hypertension: Blood pressure is normal.  No changes to therapy.  3.  Hyperlipidemia: LDL 111 on 11/18/2018.  Currently on atorvastatin 40 mg.  4. Hyperthyroidism: TSH 0.02 on 01/16/2019.  She is scheduled to see endocrinology on 02/08/2019.  Free T4 was 1.3.    Disposition: Follow up to be determined  Signed: Prentice DockerSuresh , M.D., F.A.C.C.  01/26/2019, 10:52 AM

## 2019-02-07 ENCOUNTER — Ambulatory Visit (INDEPENDENT_AMBULATORY_CARE_PROVIDER_SITE_OTHER): Payer: Medicare Other | Admitting: "Endocrinology

## 2019-02-07 ENCOUNTER — Encounter: Payer: Self-pay | Admitting: "Endocrinology

## 2019-02-07 ENCOUNTER — Other Ambulatory Visit: Payer: Self-pay

## 2019-02-07 VITALS — BP 137/81 | HR 118 | Ht 67.0 in | Wt 147.0 lb

## 2019-02-07 DIAGNOSIS — I639 Cerebral infarction, unspecified: Secondary | ICD-10-CM | POA: Diagnosis not present

## 2019-02-07 DIAGNOSIS — E0590 Thyrotoxicosis, unspecified without thyrotoxic crisis or storm: Secondary | ICD-10-CM | POA: Diagnosis not present

## 2019-02-07 MED ORDER — PROPRANOLOL HCL 20 MG PO TABS: 20.0000 mg | ORAL_TABLET | Freq: Two times a day (BID) | ORAL | 2 refills | Status: DC

## 2019-02-07 NOTE — Progress Notes (Signed)
Endocrinology Consult Note    Subjective:    Patient ID: Monica Ayers, female    DOB: 31-Oct-1960, PCP Freddy Finner, NP.   Past Medical History:  Diagnosis Date  . Allergy   . Dementia (HCC)   . Diabetes mellitus without complication (HCC)   . Hypertension     Past Surgical History:  Procedure Laterality Date  . FRACTURE SURGERY    . KNEE SURGERY Right   . NECK SURGERY    . SHOULDER SURGERY Left     Social History   Socioeconomic History  . Marital status: Married    Spouse name: Not on file  . Number of children: Not on file  . Years of education: Not on file  . Highest education level: Not on file  Occupational History  . Not on file  Social Needs  . Financial resource strain: Not on file  . Food insecurity    Worry: Not on file    Inability: Not on file  . Transportation needs    Medical: Not on file    Non-medical: Not on file  Tobacco Use  . Smoking status: Former Smoker    Types: Cigarettes  . Smokeless tobacco: Never Used  Substance and Sexual Activity  . Alcohol use: No  . Drug use: No  . Sexual activity: Not on file  Lifestyle  . Physical activity    Days per week: Not on file    Minutes per session: Not on file  . Stress: Not on file  Relationships  . Social Musician on phone: Not on file    Gets together: Not on file    Attends religious service: Not on file    Active member of club or organization: Not on file    Attends meetings of clubs or organizations: Not on file    Relationship status: Not on file  Other Topics Concern  . Not on file  Social History Narrative  . Not on file    History reviewed. No pertinent family history.  Outpatient Encounter Medications as of 02/07/2019  Medication Sig  . acetaminophen (TYLENOL) 325 MG tablet Take 2 tablets (650 mg total) by mouth every 6 (six) hours as needed for mild pain, fever or headache (or Fever >/= 101).  Marland Kitchen amLODipine (NORVASC) 5 MG tablet Take 1 tablet (5  mg total) by mouth daily. For BP  . aspirin 325 MG EC tablet Take 1 tablet (325 mg total) by mouth daily with breakfast.  . lisinopril (ZESTRIL) 10 MG tablet Take 1 tablet (10 mg total) by mouth daily. For BP  . LORazepam (ATIVAN) 0.5 MG tablet Take 1 tablet (0.5 mg total) by mouth daily as needed for anxiety. Take 30-1 hour prior to needing shower  . metFORMIN (GLUCOPHAGE) 500 MG tablet Take 1 tablet (500 mg total) by mouth daily.  . potassium chloride (K-DUR) 10 MEQ tablet Take 1 tablet (10 mEq total) by mouth daily.  . Vitamin D, Ergocalciferol, (DRISDOL) 1.25 MG (50000 UT) CAPS capsule Take 1 capsule (50,000 Units total) by mouth every 7 (seven) days.  Marland Kitchen atorvastatin (LIPITOR) 40 MG tablet Take 1 tablet (40 mg total) by mouth daily at 6 PM.  . propranolol (INDERAL) 20 MG tablet Take 1 tablet (20 mg total) by mouth 2 (two) times daily.   No facility-administered encounter medications on file as of 02/07/2019.     ALLERGIES: Allergies  Allergen Reactions  . Latex Rash    VACCINATION STATUS:  Immunization History  Administered Date(s) Administered  . Influenza Inj Mdck Quad With Preservative 02/13/2018  . Influenza,inj,Quad PF,6+ Mos 01/24/2019  . Influenza,trivalent, recombinat, inj, PF 02/05/2017     HPI  Monica Ayers is 58 y.o. female who presents today with a medical history as above. she is being seen in consultation for hyperthyroidism requested by Perlie Mayo, NP.  She is a poor historian related to her dementia, history is obtained from chart review and her husband who is assisting in all of her care aspects.  she has been dealing with symptoms of progressive weight loss of up to 40 pounds over 1 to 2 years, tremors, heat intolerance, palpitations.  These symptoms are progressively worsening and troubling to her. her most recent thyroid labs revealed  Lab Results  Component Value Date   TSH 0.02 (L) 01/16/2019   TSH 0.021 (L) 12/28/2018   TSH 0.427 11/17/2018    FREET4 1.3 01/16/2019   FREET4 1.42 (H) 12/30/2018   . she denies dysphagia, choking, shortness of breath, no recent voice change.   -Unable to obtain her family history, no reported history of thyroid malignancy. - she denies personal history of goiter. she is not on any anti-thyroid medications nor on any thyroid hormone supplements. she  is willing to proceed with appropriate work up and therapy for thyrotoxicosis.                           Review of systems  Constitutional: + weight loss, + fatigue, + subjective hyperthermia Eyes: no blurry vision, + xerophthalmia ENT: no sore throat, no nodules palpated in throat, no dysphagia/odynophagia, nor hoarseness Cardiovascular: no Chest Pain, no Shortness of Breath, ++  palpitations, no leg swelling Respiratory: no cough, no SOB Gastrointestinal: no Nausea, no Vomiting, no Diarhhea Musculoskeletal: no muscle/joint aches Skin: no rashes Neurological: ++  tremors, no numbness, no tingling, no dizziness Psychiatric: no depression, +  anxiety   Objective:    BP 137/81   Pulse (!) 118   Ht 5\' 7"  (1.702 m)   Wt 147 lb (66.7 kg)   BMI 23.02 kg/m   Wt Readings from Last 3 Encounters:  02/07/19 147 lb (66.7 kg)  01/26/19 152 lb (68.9 kg)  01/24/19 152 lb (68.9 kg)                                                Physical exam  Constitutional: Body mass index is 23.02 kg/m., not in acute distress, + anxious state of mind Eyes: PERRLA, EOMI, - exophthalmos ENT: moist mucous membranes, +  thyromegaly, no cervical lymphadenopathy Cardiovascular:  ++ Hyperactive precordial activity, + tachycardic at 118, no Murmur/Rubs/Gallops Respiratory:  adequate breathing efforts, no gross chest deformity, Clear to auscultation bilaterally Gastrointestinal: abdomen soft, Non -tender, No distension, Bowel Sounds present Musculoskeletal: no gross deformities, strength intact in all four extremities Skin:  + dry , warm, no rashes,  Neurological:  ++   tremor with outstretched hands,  ++ Deep Tendon Reflexes  on both lower extremities.   CMP     Component Value Date/Time   NA 145 01/16/2019 1112   K 3.2 (L) 01/16/2019 1112   CL 102 01/16/2019 1112   CO2 28 01/16/2019 1112   GLUCOSE 153 (H) 01/16/2019 1112   BUN 10 01/16/2019 1112  CREATININE 0.62 01/16/2019 1112   CALCIUM 9.7 01/16/2019 1112   PROT 7.0 01/16/2019 1112   ALBUMIN 4.4 12/28/2018 1453   AST 27 01/16/2019 1112   ALT 21 01/16/2019 1112   ALKPHOS 64 12/28/2018 1453   BILITOT 1.0 01/16/2019 1112   GFRNONAA 99 01/16/2019 1112   GFRAA 115 01/16/2019 1112     CBC    Component Value Date/Time   WBC 7.0 01/16/2019 1112   RBC 5.21 (H) 01/16/2019 1112   HGB 14.9 01/16/2019 1112   HCT 44.3 01/16/2019 1112   PLT 354 01/16/2019 1112   MCV 85.0 01/16/2019 1112   MCH 28.6 01/16/2019 1112   MCHC 33.6 01/16/2019 1112   RDW 14.2 01/16/2019 1112   LYMPHSABS 1.2 12/28/2018 1453   MONOABS 0.7 12/28/2018 1453   EOSABS 0.0 12/28/2018 1453   BASOSABS 0.1 12/28/2018 1453     Diabetic Labs (most recent): Lab Results  Component Value Date   HGBA1C 6.7 (H) 11/17/2018    Lipid Panel     Component Value Date/Time   CHOL 184 11/18/2018 0800   TRIG 195 (H) 11/18/2018 0800   HDL 34 (L) 11/18/2018 0800   CHOLHDL 5.4 11/18/2018 0800   VLDL 39 11/18/2018 0800   LDLCALC 111 (H) 11/18/2018 0800     Lab Results  Component Value Date   TSH 0.02 (L) 01/16/2019   TSH 0.021 (L) 12/28/2018   TSH 0.427 11/17/2018   FREET4 1.3 01/16/2019   FREET4 1.42 (H) 12/30/2018        Assessment & Plan:   1. Hyperthyroidism she is being seen at a kind request of Freddy FinnerMills, Hannah M, NP. her history and most recent labs are reviewed, and she was examined clinically. Subjective and objective findings are consistent with thyrotoxicosis likely from primary hyperthyroidism. The potential risks of untreated thyrotoxicosis and the need for definitive therapy have been discussed in detail with  her and her husband, and the family they agreed to proceed with diagnostic work-up and treatment plan.    I like to btainconfirmatory thyroid uptake and scan will be scheduled to be done as soon as possible.   Options of therapy are discussed with her.  We discussed the option of treating it with medications including methimazole or PTU  And  option of definitive therapy with RAI ablation of the thyroid.  she will return in 10 days for treatment decision.   I will  initiate a temporary prescription for  Propranolol  20 mg po BID for symptomatic relief.  - I advised her to maintain close follow up with Freddy FinnerMills, Hannah M, NP for primary care needs.   - Time spent with the patient: 45 minutes, of which >50% was spent in obtaining information about her symptoms, reviewing her previous labs, evaluations, and treatments, counseling her about her hyperthyroidism, and developing a plan to confirm the diagnosis and long term treatment as necessary. Please refer to " Patient Self Inventory" in the Media  tab for reviewed elements of pertinent patient history.  Vicente Massonochelle M Gargis participated in the discussions, expressed understanding, and voiced agreement with the above plans.  All questions were answered to her satisfaction. she is encouraged to contact clinic should she have any questions or concerns prior to her return visit.   Follow up plan: Return in about 10 days (around 02/17/2019) for Follow up with Thyroid Uptake and Scan.   Thank you for involving me in the care of this pleasant patient, and I will continue to update  you with her progress.  Marquis Lunch, MD [facility] Phone: [phone]  Fax: [phone]   02/07/2019, 2:14 PM  This note was partially dictated with voice recognition software. Similar sounding words can be transcribed inadequately or may not  be corrected upon review.

## 2019-02-08 ENCOUNTER — Ambulatory Visit: Payer: Medicare Other | Admitting: "Endocrinology

## 2019-02-08 ENCOUNTER — Telehealth: Payer: Self-pay | Admitting: Internal Medicine

## 2019-02-08 NOTE — Telephone Encounter (Signed)
Received call from [employer] monitoring regarding critical result.   They report manual transmission at 6pm with several symptoms noted including "passed out". The 1 minute transmission shows sinus rhythm at a rate of about 87 bpm. Their call to patient was unsuccessful.  Called personally, husband answered. States she is asleep right now and that she just received the monitor today. He says she will not keep it on and has pulled it off. She was watching TV around 6pm and he is confident that she has not passed out. Maybe "hit some buttons pulling it off". He will try to have her replace the monitor tomorrow morning.

## 2019-02-09 ENCOUNTER — Telehealth: Payer: Self-pay | Admitting: Cardiovascular Disease

## 2019-02-09 NOTE — Telephone Encounter (Signed)
Pt husband wants to think about this - they will send monitor back tomorrow - will update [place] - will forward to Dr Bronson Ing nurse FYI for f/u

## 2019-02-09 NOTE — Telephone Encounter (Signed)
Sander Radon -sister in law called stating that patient continues to take her monitor off.   [phone])

## 2019-02-09 NOTE — Telephone Encounter (Signed)
The only other option apart from a wearable monitor is an implantable loop recorder.

## 2019-02-09 NOTE — Telephone Encounter (Signed)
Would pt need a referral to EP for that?

## 2019-02-09 NOTE — Telephone Encounter (Signed)
Pt received monitor yesterday (30 day event for CVA) and wore it about 2 hours and repeatedly taken off (has some form of Dementia according to husband and sister in law) wanted to know if she can return monitor and if any other options were available besides wearing a monitor

## 2019-02-09 NOTE — Telephone Encounter (Signed)
Yes. But only if she is interested in such a device.

## 2019-02-13 ENCOUNTER — Encounter (HOSPITAL_COMMUNITY): Payer: Self-pay

## 2019-02-13 ENCOUNTER — Other Ambulatory Visit: Payer: Self-pay

## 2019-02-13 ENCOUNTER — Ambulatory Visit (HOSPITAL_COMMUNITY)
Admission: RE | Admit: 2019-02-13 | Discharge: 2019-02-13 | Disposition: A | Discharge status: Home / Self Care | Payer: Medicare Other | Source: Ambulatory Visit | Attending: "Endocrinology | Admitting: "Endocrinology

## 2019-02-13 DIAGNOSIS — E0590 Thyrotoxicosis, unspecified without thyrotoxic crisis or storm: Secondary | ICD-10-CM | POA: Diagnosis not present

## 2019-02-13 MED ORDER — SODIUM IODIDE I-123 7.4 MBQ CAPS
440.0000 | ORAL_CAPSULE | Freq: Once | ORAL | Status: AC
  Administered 2019-02-13: 440 via ORAL

## 2019-02-14 ENCOUNTER — Ambulatory Visit (HOSPITAL_COMMUNITY)
Admission: RE | Admit: 2019-02-14 | Discharge: 2019-02-14 | Disposition: A | Discharge status: Home / Self Care | Payer: Medicare Other | Source: Ambulatory Visit | Attending: "Endocrinology | Admitting: "Endocrinology

## 2019-02-16 ENCOUNTER — Other Ambulatory Visit: Payer: Self-pay

## 2019-02-16 DIAGNOSIS — I10 Essential (primary) hypertension: Secondary | ICD-10-CM

## 2019-02-16 MED ORDER — AMLODIPINE BESYLATE 5 MG PO TABS: 5.0000 mg | ORAL_TABLET | Freq: Every day | ORAL | 1 refills | Status: DC

## 2019-02-22 ENCOUNTER — Encounter: Payer: Self-pay | Admitting: "Endocrinology

## 2019-02-22 ENCOUNTER — Ambulatory Visit (INDEPENDENT_AMBULATORY_CARE_PROVIDER_SITE_OTHER): Payer: Medicare Other | Admitting: "Endocrinology

## 2019-02-22 ENCOUNTER — Other Ambulatory Visit: Payer: Self-pay

## 2019-02-22 DIAGNOSIS — I639 Cerebral infarction, unspecified: Secondary | ICD-10-CM | POA: Diagnosis not present

## 2019-02-22 DIAGNOSIS — E0510 Thyrotoxicosis with toxic single thyroid nodule without thyrotoxic crisis or storm: Secondary | ICD-10-CM | POA: Diagnosis not present

## 2019-02-22 NOTE — Progress Notes (Signed)
02/22/2019                                Endocrinology Telehealth Visit Follow up Note -During COVID -19 Pandemic  I connected with Monica Ayers on 02/22/2019   by telephone and verified that I am speaking with the correct person using two identifiers. Monica Ayers, 19-Aug-1960. she has verbally consented to this visit. All issues noted in this document were discussed and addressed. The format was not optimal for physical exam.   Subjective:    Patient ID: Monica Ayers, female    DOB: 19-Aug-1960, PCP Freddy FinnerMills, Hannah M, NP.   Past Medical History:  Diagnosis Date  . Allergy   . Dementia (HCC)   . Diabetes mellitus without complication (HCC)   . Hypertension     Past Surgical History:  Procedure Laterality Date  . FRACTURE SURGERY    . KNEE SURGERY Right   . NECK SURGERY    . SHOULDER SURGERY Left     Social History   Socioeconomic History  . Marital status: Married    Spouse name: Not on file  . Number of children: Not on file  . Years of education: Not on file  . Highest education level: Not on file  Occupational History  . Not on file  Social Needs  . Financial resource strain: Not on file  . Food insecurity    Worry: Not on file    Inability: Not on file  . Transportation needs    Medical: Not on file    Non-medical: Not on file  Tobacco Use  . Smoking status: Former Smoker    Types: Cigarettes  . Smokeless tobacco: Never Used  Substance and Sexual Activity  . Alcohol use: No  . Drug use: No  . Sexual activity: Not on file  Lifestyle  . Physical activity    Days per week: Not on file    Minutes per session: Not on file  . Stress: Not on file  Relationships  . Social Musicianconnections    Talks on phone: Not on file    Gets together: Not on file    Attends religious service: Not on file    Active member of club or organization: Not on file    Attends meetings of clubs or organizations: Not on file    Relationship status: Not on file  Other  Topics Concern  . Not on file  Social History Narrative  . Not on file    History reviewed. No pertinent family history.  Outpatient Encounter Medications as of 02/22/2019  Medication Sig  . acetaminophen (TYLENOL) 325 MG tablet Take 2 tablets (650 mg total) by mouth every 6 (six) hours as needed for mild pain, fever or headache (or Fever >/= 101).  Marland Kitchen. amLODipine (NORVASC) 5 MG tablet Take 1 tablet (5 mg total) by mouth daily. For BP  . aspirin 325 MG EC tablet Take 1 tablet (325 mg total) by mouth daily with breakfast.  . atorvastatin (LIPITOR) 40 MG tablet Take 1 tablet (40 mg total) by mouth daily at 6 PM.  . lisinopril (ZESTRIL) 10 MG tablet Take 1 tablet (10 mg total) by mouth daily. For BP  . LORazepam (ATIVAN) 0.5 MG tablet Take 1 tablet (0.5 mg total) by mouth daily as needed for anxiety. Take 30-1 hour prior to needing shower  . metFORMIN (GLUCOPHAGE) 500 MG tablet Take 1 tablet (500 mg  total) by mouth daily.  . potassium chloride (K-DUR) 10 MEQ tablet Take 1 tablet (10 mEq total) by mouth daily.  . propranolol (INDERAL) 20 MG tablet Take 1 tablet (20 mg total) by mouth 2 (two) times daily.  . Vitamin D, Ergocalciferol, (DRISDOL) 1.25 MG (50000 UT) CAPS capsule Take 1 capsule (50,000 Units total) by mouth every 7 (seven) days.   No facility-administered encounter medications on file as of 02/22/2019.     ALLERGIES: Allergies  Allergen Reactions  . Latex Rash    VACCINATION STATUS: Immunization History  Administered Date(s) Administered  . Influenza Inj Mdck Quad With Preservative 02/13/2018  . Influenza,inj,Quad PF,6+ Mos 01/24/2019  . Influenza,trivalent, recombinat, inj, PF 02/05/2017     HPI  Monica Ayers is 58 y.o. female who presents today with a medical history as above. she is being seen in consultation for hyperthyroidism requested by Freddy Finner, NP.  She is a poor historian related to her dementia, history is obtained from chart review and her  husband who is assisting in all of her care aspects.  she has been dealing with symptoms of progressive weight loss of up to 40 pounds over 1 to 2 years, tremors, heat intolerance, palpitations.  These symptoms are progressively worsening and troubling to her.  her most recent thyroid labs revealed  Lab Results  Component Value Date   TSH 0.02 (L) 01/16/2019   TSH 0.021 (L) 12/28/2018   TSH 0.427 11/17/2018   FREET4 1.3 01/16/2019   FREET4 1.42 (H) 12/30/2018   . she denies dysphagia, choking, shortness of breath, no recent voice change.   -Unable to obtain her family history, no reported history of thyroid malignancy. - she denies personal history of goiter. she is not on any anti-thyroid medications nor on any thyroid hormone supplements.  Her thyroid uptake and scan confirms a toxic nodule in the right lobe of her thyroid.                            Review of systems    Objective:    There were no vitals taken for this visit.  Wt Readings from Last 3 Encounters:  02/07/19 147 lb (66.7 kg)  01/26/19 152 lb (68.9 kg)  01/24/19 152 lb (68.9 kg)                                                Physical exam  CMP     Component Value Date/Time   NA 145 01/16/2019 1112   K 3.2 (L) 01/16/2019 1112   CL 102 01/16/2019 1112   CO2 28 01/16/2019 1112   GLUCOSE 153 (H) 01/16/2019 1112   BUN 10 01/16/2019 1112   CREATININE 0.62 01/16/2019 1112   CALCIUM 9.7 01/16/2019 1112   PROT 7.0 01/16/2019 1112   ALBUMIN 4.4 12/28/2018 1453   AST 27 01/16/2019 1112   ALT 21 01/16/2019 1112   ALKPHOS 64 12/28/2018 1453   BILITOT 1.0 01/16/2019 1112   GFRNONAA 99 01/16/2019 1112   GFRAA 115 01/16/2019 1112     CBC    Component Value Date/Time   WBC 7.0 01/16/2019 1112   RBC 5.21 (H) 01/16/2019 1112   HGB 14.9 01/16/2019 1112   HCT 44.3 01/16/2019 1112   PLT 354 01/16/2019 1112   MCV 85.0  01/16/2019 1112   MCH 28.6 01/16/2019 1112   MCHC 33.6 01/16/2019 1112   RDW 14.2  01/16/2019 1112   LYMPHSABS 1.2 12/28/2018 1453   MONOABS 0.7 12/28/2018 1453   EOSABS 0.0 12/28/2018 1453   BASOSABS 0.1 12/28/2018 1453     Diabetic Labs (most recent): Lab Results  Component Value Date   HGBA1C 6.7 (H) 11/17/2018    Lipid Panel     Component Value Date/Time   CHOL 184 11/18/2018 0800   TRIG 195 (H) 11/18/2018 0800   HDL 34 (L) 11/18/2018 0800   CHOLHDL 5.4 11/18/2018 0800   VLDL 39 11/18/2018 0800   LDLCALC 111 (H) 11/18/2018 0800     Lab Results  Component Value Date   TSH 0.02 (L) 01/16/2019   TSH 0.021 (L) 12/28/2018   TSH 0.427 11/17/2018   FREET4 1.3 01/16/2019   FREET4 1.42 (H) 12/30/2018     FINDINGS: A potential warm nodule in the RIGHT lobe of thyroid gland. Mild nodularity in the LEFT lobe additionally.  4 hour I-123 uptake = 6.9% (normal 5-20%)  24 hour I-123 uptake = 18% (normal 10-30%)  IMPRESSION: 1. Mildly nodular thyroid gland. Potential warm nodule in the RIGHT lobe of the gland. Differential include toxic autonomous nodule versus toxic multinodular goiter. 2. Measured I 123 uptake within normal limits.   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  Complications of untreated hypothyroidism is discussed with her.  She is a candidate for thyroid ablation with I-131 therapy.  This treatment was discussed with her and her husband over the phone and they agree to proceed.  This will be scheduled to be administered in [facility] in the next several days. She is advised to continue propanolol 20 mg p.o. twice daily. She will return in 9 weeks with repeat thyroid function tests after her I-131 thyroid ablation.   - I advised her to maintain close follow up with Perlie Mayo, NP for primary care needs.   Time for this visit: 15 minutes. Susa Griffins and her husband participated in the  discussions, expressed understanding, and voiced agreement with the above plans.  All questions were answered to her satisfaction. she is encouraged to contact clinic should she have any questions or concerns prior to her return visit.   Follow up plan: Return in about 9 weeks (around 04/26/2019) for Follow up with Labs after I131 Therapy.   Thank you for involving me in the care of this pleasant patient, and I will continue to update you with her progress.  Glade Lloyd, MD [facility] Phone: [phone]  Fax: [phone]   02/22/2019, 5:32 PM  This note was partially dictated with voice recognition software. Similar sounding words can be transcribed inadequately or may not  be corrected upon review.

## 2019-02-23 ENCOUNTER — Other Ambulatory Visit: Payer: Self-pay

## 2019-02-23 DIAGNOSIS — E876 Hypokalemia: Secondary | ICD-10-CM

## 2019-02-23 MED ORDER — POTASSIUM CHLORIDE ER 10 MEQ PO TBCR: 10.0000 meq | EXTENDED_RELEASE_TABLET | Freq: Every day | ORAL | 2 refills | Status: DC

## 2019-02-28 ENCOUNTER — Telehealth: Payer: Self-pay | Admitting: "Endocrinology

## 2019-02-28 MED ORDER — METHIMAZOLE 5 MG PO TABS: 5.0000 mg | ORAL_TABLET | Freq: Every day | ORAL | 3 refills | Status: DC

## 2019-02-28 NOTE — Telephone Encounter (Signed)
Monica Ayers daughter Monica Ayers [phone]) called and says that they will not be able to keep her away from people after Thyroid ablation due to her level of dementia. She is asking if there are anymore alternatives.

## 2019-02-28 NOTE — Telephone Encounter (Signed)
Yes, we can treat it with methimazole 5mg  po qday, and repeat TSH, free t4, free t3 in 3 months.

## 2019-03-03 ENCOUNTER — Ambulatory Visit (HOSPITAL_COMMUNITY): Payer: Medicare Other

## 2019-03-13 ENCOUNTER — Ambulatory Visit (INDEPENDENT_AMBULATORY_CARE_PROVIDER_SITE_OTHER): Payer: Medicare Other

## 2019-03-13 DIAGNOSIS — I639 Cerebral infarction, unspecified: Secondary | ICD-10-CM | POA: Diagnosis not present

## 2019-03-16 ENCOUNTER — Telehealth: Payer: Self-pay | Admitting: *Deleted

## 2019-03-16 NOTE — Telephone Encounter (Signed)
Notes recorded by Laurine Blazer, LPN on 50/93/2671 at 5:52 PM EST  Husband Symphany Fleissner) notified. Copy to pmd.  ------   Notes recorded by Herminio Commons, MD on 03/15/2019 at 4:49 PM EST  No significant arrhythmias.

## 2019-03-21 ENCOUNTER — Telehealth: Payer: Self-pay | Admitting: *Deleted

## 2019-03-21 NOTE — Telephone Encounter (Signed)
-----   Message from Herminio Commons, MD sent at 03/16/2019  8:06 PM EST ----- Regarding: RE: need f/u Doesn't need to fu with me. Read phone messages between me and Staci from October. She returned the event monitor so if monitoring is something she desires in the future, she would need an implantable loop recorder and would have to see EP in that case. ----- Message ----- From: Laurine Blazer, LPN Sent: 86/57/8469   5:53 PM EST To: Herminio Commons, MD Subject: need f/u                                       When do you want to see her back?  Notes said pending.  Thanks,  Edd Fabian

## 2019-03-21 NOTE — Telephone Encounter (Signed)
Husband Jeneen Rinks) notified and verbalized understanding.  He will contact us back if this is something he wishes to pursue in the future.

## 2019-03-27 ENCOUNTER — Telehealth: Payer: Self-pay | Admitting: *Deleted

## 2019-03-27 ENCOUNTER — Other Ambulatory Visit: Payer: Self-pay

## 2019-03-27 MED ORDER — ATORVASTATIN CALCIUM 40 MG PO TABS: 40.0000 mg | ORAL_TABLET | Freq: Every day | ORAL | 3 refills | Status: DC

## 2019-03-27 NOTE — Telephone Encounter (Signed)
Pt is out of atorvastatin she [facility] [place].

## 2019-03-27 NOTE — Telephone Encounter (Signed)
Med refilled.

## 2019-03-28 ENCOUNTER — Other Ambulatory Visit: Payer: Self-pay

## 2019-03-28 ENCOUNTER — Encounter: Payer: Self-pay | Admitting: Family Medicine

## 2019-03-28 ENCOUNTER — Ambulatory Visit (INDEPENDENT_AMBULATORY_CARE_PROVIDER_SITE_OTHER): Payer: Medicare Other | Admitting: Family Medicine

## 2019-03-28 VITALS — BP 128/80 | HR 76 | Ht 67.0 in | Wt 143.5 lb

## 2019-03-28 DIAGNOSIS — R000 Tachycardia, unspecified: Secondary | ICD-10-CM

## 2019-03-28 DIAGNOSIS — I10 Essential (primary) hypertension: Secondary | ICD-10-CM

## 2019-03-28 DIAGNOSIS — G3109 Other frontotemporal dementia: Secondary | ICD-10-CM | POA: Diagnosis not present

## 2019-03-28 DIAGNOSIS — F0280 Dementia in other diseases classified elsewhere without behavioral disturbance: Secondary | ICD-10-CM

## 2019-03-28 DIAGNOSIS — E119 Type 2 diabetes mellitus without complications: Secondary | ICD-10-CM

## 2019-03-28 DIAGNOSIS — R2681 Unsteadiness on feet: Secondary | ICD-10-CM

## 2019-03-28 NOTE — Patient Instructions (Signed)
  I appreciate the opportunity to provide you with care for your health and wellness. Today we discussed: overall health  Follow up: 3 months can be in person or over phone   No labs or referrals today ----we will look at possibly getting updated labs next time if needed.  So glad that things are settling down a little bit and everything is going much better. Mrs. Muhlbauer is very blessed to have a good family support system.   And as her healthcare provider I am very grateful for you all and  everything that you all do to help support her.  Continue all current medications at this time. If there are any falls, changes in mentation or confusion or I f she changes in her energy level please do not hesitate to reach out prior to the 3 months if needed.  I hope you have a wonderful, happy, safe, and healthy Holiday Season! See you in the New Year :)  Please continue to practice social distancing to keep you, your family, and our community safe.  If you must go out, please wear a mask and practice good handwashing.  It was a pleasure to see you and I look forward to continuing to work together on your health and well-being. Please do not hesitate to call the office if you need care or have questions about your care.  Have a wonderful day and week. With Gratitude, Cherly Beach, DNP, AGNP-BC

## 2019-03-28 NOTE — Progress Notes (Signed)
Virtual Visit via Telephone Note   This visit type was conducted due to national recommendations for restrictions regarding the COVID-19 Pandemic (e.g. social distancing) in an effort to limit this patient's exposure and mitigate transmission in our community.  Due to her co-morbid illnesses, this patient is at least at moderate risk for complications without adequate follow up.  This format is felt to be most appropriate for this patient at this time.  The patient did not have access to video technology/had technical difficulties with video requiring transitioning to audio format only (telephone).  All issues noted in this document were discussed and addressed.  No physical exam could be performed with this format.    Evaluation Performed:  Follow-up visit  Date:  03/28/2019   ID:  Monica Ayers, DOB May 15, 1960, MRN [id number]  Patient Location: Home Provider Location: Office  Location of Patient: Home Location of Provider: Telehealth Consent was obtain for visit to be over via telehealth. I verified that I am speaking with the correct person using two identifiers.  PCP:  Freddy Finner, NP   Chief Complaint: Follow-up for chronic conditions including dementia, diabetes, hypertension  History of Present Illness:    Monica Ayers is a 58 y.o. female with with dementia, diabetes, hypertension, hypothyroidism among others.  Presents today for follow-up regarding dementia, diabetes, hypertension. Sister-in-law Steward Drone is historian along with husband Fayrene Fearing.  Patient is able to verbalize yes usually to most questions even if not appropriate.  Per sister-in-law and husband patient is sleeping well.  She goes to bed at 7 AM and gets up around 6 AM.  They do not have any sleepwalking, sleep disturbance issues with her at this time.  Reports that she eats and drinks without issue.  Reports that she is drinking water very well.  She does have a little bit more pickiness when it comes  to food she will sometimes vomit up certain things that she has eaten or she will might eat the things that they water to eat or not at the time that they want her to eat.  This makes a little bit more difficult to control her blood sugars.  But otherwise she is doing okay and usually for the most part she eats well.  Incontinence is still an issue mostly with bowel movements.  Does not realize she needs to have a bowel movement.  She is little bit better with urine usually after having a bowel movement she still does not relate she has had a bowel movement until they notice it.  Husband is keeping her as clean as possible to help prevent any skin breakdown or issues.  They do not notice any pressure or excoriated skin at this time.  Memory is at her baseline but they deny having any new onset of confusion or lethargic like activity.  She is definitely much more engaged and happier and communicating and working with them.  Her communication pattern is more childlike than for her age.  She enjoys coloring with her sister-in-law's granddaughter.  She has not had any falls.  Husband Fayrene Fearing helps move around the house.  They do have her use the walker at times in the wheelchair when they go out if they need to go out. The only big complaint is that she will not get into the bathtub so it is very hard to keep her washed up and clean.  Unless she has had a accident and they can talk her into  letting them get clean because she realizes that she needs to be cleaned at that time.  The patient does not have symptoms concerning for COVID-19 infection (fever, chills, cough, or new shortness of breath).   Past Medical, Surgical, Social History, Allergies, and Medications have been Reviewed. Past Medical History:  Diagnosis Date   Allergy    Dementia (HCC)    Diabetes mellitus without complication (HCC)    Hypertension    Past Surgical History:  Procedure Laterality Date   FRACTURE SURGERY     KNEE SURGERY  Right    NECK SURGERY     SHOULDER SURGERY Left      No outpatient medications have been marked as taking for the 03/28/19 encounter (Office Visit) with Freddy FinnerMills, Damante Spragg M, NP.     Allergies:   Latex   Social History   Tobacco Use   Smoking status: Former Smoker    Types: Cigarettes   Smokeless tobacco: Never Used  Substance Use Topics   Alcohol use: No   Drug use: No     Family Hx: The patient's family history is not on file.  ROS:   Please see the history of present illness.    All other systems reviewed and are negative.   Labs/Other Tests and Data Reviewed:    Recent Labs: 12/30/2018: Magnesium 2.2 01/16/2019: ALT 21; BUN 10; Creat 0.62; Hemoglobin 14.9; Platelets 354; Potassium 3.2; Sodium 145; TSH 0.02   Recent Lipid Panel Lab Results  Component Value Date/Time   CHOL 184 11/18/2018 08:00 AM   TRIG 195 (H) 11/18/2018 08:00 AM   HDL 34 (L) 11/18/2018 08:00 AM   CHOLHDL 5.4 11/18/2018 08:00 AM   LDLCALC 111 (H) 11/18/2018 08:00 AM    Wt Readings from Last 3 Encounters:  03/28/19 143 lb 8 oz (65.1 kg)  02/07/19 147 lb (66.7 kg)  01/26/19 152 lb (68.9 kg)     Objective:    Vital Signs:  BP 128/80    Pulse 76    Ht 5\' 7"  (1.702 m)    Wt 143 lb 8 oz (65.1 kg)    BMI 22.48 kg/m    VITAL SIGNS:  reviewed GEN:  Alert, orientation at baseline RESPIRATORY:  No shortness of breath in conversation PSYCH:  Baseline affect, mood willingness to communicate, sister-in-law is historian.  ASSESSMENT & PLAN:    1. Frontal lobe dementia (HCC) Stable. No significant changes in mentation, memory, confusion.  Appears to have formed a baseline of communication which results usually in her saying yes to most everything.  Limited reading limited communication on a more age appropriate level. But she is doing much better and not lethargic as she was several months ago.  2. Type 2 diabetes mellitus without complication, without long-term current use of insulin (HCC) She  is encouraged to check blood sugar daily as directed. Appears to have some slightly elevation in fasting, but eating can be hard at times to control. Continue current medications. Is on statin as well. She and family are reminded the importance of maintaining  good blood sugars,  taking medications as directed, daily foot care, annual eye exams. Additionally educated about keeping good control over blood pressure and cholesterol as well.  3. Essential hypertension She is encouraged to maintain a well balanced diet that is low in salt. Controlled, continue current medication regimen.  No refills needed.  Additionally,she is also reminded that exercise is beneficial for heart health and control of  Blood pressure. 30-60 minutes daily is  recommended-walking was suggested. Walking is harder, so goal for Mrs Moynahan is to stay as active as possible during the  Day time.   4. Tachycardia Use of Inderal to help with tachycardia secondary to hyperthyroidism. Improved continue Inderal  5. Unsteady gait Improved, use of walker and wheelchair as needed and her husband helps her with ambulation around the house.     Time:   Today, I have spent 10 minutes with the patient with telehealth technology discussing the above problems.     Medication Adjustments/Labs and Tests Ordered: Current medicines are reviewed at length with the patient today.  Concerns regarding medicines are outlined above.   Tests Ordered: No orders of the defined types were placed in this encounter.   Medication Changes: No orders of the defined types were placed in this encounter.   Disposition:  Follow up 3 months   Signed, Perlie Mayo, NP  03/28/2019 10:29 AM     [facility]  Hi there is Ms. nohea kras are you doing I am

## 2019-04-24 ENCOUNTER — Other Ambulatory Visit: Payer: Self-pay

## 2019-04-24 ENCOUNTER — Telehealth: Payer: Self-pay | Admitting: *Deleted

## 2019-04-24 ENCOUNTER — Other Ambulatory Visit: Payer: Self-pay | Admitting: Family Medicine

## 2019-04-24 DIAGNOSIS — E876 Hypokalemia: Secondary | ICD-10-CM

## 2019-04-24 DIAGNOSIS — I10 Essential (primary) hypertension: Secondary | ICD-10-CM

## 2019-04-24 MED ORDER — POTASSIUM CHLORIDE ER 10 MEQ PO TBCR: 10.0000 meq | EXTENDED_RELEASE_TABLET | Freq: Every day | ORAL | 2 refills | Status: DC

## 2019-04-24 NOTE — Telephone Encounter (Signed)
Pt husband called said she needs a refill on her potassium sent to [place] [facility] she has no refills on this.

## 2019-04-24 NOTE — Telephone Encounter (Signed)
Prescription sent into pharmacy

## 2019-05-01 ENCOUNTER — Ambulatory Visit: Payer: Medicare Other | Admitting: "Endocrinology

## 2019-05-13 ENCOUNTER — Emergency Department (HOSPITAL_COMMUNITY)
Admission: EM | Admit: 2019-05-13 | Discharge: 2019-05-13 | Disposition: A | Discharge status: Home / Self Care | Payer: Medicare Other | Attending: Emergency Medicine | Admitting: Emergency Medicine

## 2019-05-13 ENCOUNTER — Other Ambulatory Visit: Payer: Self-pay

## 2019-05-13 ENCOUNTER — Encounter (HOSPITAL_COMMUNITY): Payer: Self-pay | Admitting: *Deleted

## 2019-05-13 ENCOUNTER — Emergency Department (HOSPITAL_COMMUNITY): Payer: Medicare Other

## 2019-05-13 DIAGNOSIS — R519 Headache, unspecified: Secondary | ICD-10-CM | POA: Diagnosis present

## 2019-05-13 DIAGNOSIS — Z7982 Long term (current) use of aspirin: Secondary | ICD-10-CM | POA: Diagnosis not present

## 2019-05-13 DIAGNOSIS — Z79899 Other long term (current) drug therapy: Secondary | ICD-10-CM | POA: Insufficient documentation

## 2019-05-13 DIAGNOSIS — E119 Type 2 diabetes mellitus without complications: Secondary | ICD-10-CM | POA: Diagnosis not present

## 2019-05-13 DIAGNOSIS — W010XXA Fall on same level from slipping, tripping and stumbling without subsequent striking against object, initial encounter: Secondary | ICD-10-CM | POA: Diagnosis not present

## 2019-05-13 DIAGNOSIS — F0390 Unspecified dementia without behavioral disturbance: Secondary | ICD-10-CM | POA: Insufficient documentation

## 2019-05-13 DIAGNOSIS — Z7984 Long term (current) use of oral hypoglycemic drugs: Secondary | ICD-10-CM | POA: Insufficient documentation

## 2019-05-13 DIAGNOSIS — Z91040 Latex allergy status: Secondary | ICD-10-CM | POA: Insufficient documentation

## 2019-05-13 DIAGNOSIS — Z87891 Personal history of nicotine dependence: Secondary | ICD-10-CM | POA: Diagnosis not present

## 2019-05-13 DIAGNOSIS — I10 Essential (primary) hypertension: Secondary | ICD-10-CM | POA: Insufficient documentation

## 2019-05-13 DIAGNOSIS — W19XXXA Unspecified fall, initial encounter: Secondary | ICD-10-CM

## 2019-05-13 NOTE — ED Provider Notes (Signed)
[facility] EMERGENCY DEPARTMENT Provider Note   CSN: [id number] Arrival date & time: 05/13/19  1055     History Chief Complaint  Patient presents with  . Fall    [address] Monica Ayers is a 59 y.o. female.  Patient brought in by her husband.  Patient got up from from the recliner today and fell over.  She was able to get back up with his help and he was walking without any complaints.  She did not appear to have any injuries patient's husband not sure if she did hit her head but he wants that evaluated.  Patient has known dementia he is her caregiver.        Past Medical History:  Diagnosis Date  . Allergy   . Dementia ([place])   . Diabetes mellitus without complication ([place])   . Hypertension     Patient Active Problem List   Diagnosis Date Noted  . Hyperthyroidism 01/18/2019  . Sepsis ([place]) 12/28/2018  . Hypokalemia   . Unsteady gait 12/13/2018  . Falls frequently 12/13/2018  . Pressure injury of skin 11/18/2018  . Transaminasemia 11/18/2018  . Sepsis due to undetermined organism ([place]) 11/17/2018  . Lactic acidosis 11/17/2018  . Type 2 diabetes mellitus without complication, without long-term current use of insulin ([place]) 11/17/2018  . 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 Essential hypertension 10/02/2016  . Allergic state 10/02/2016    Past Surgical History:  Procedure Laterality Date  . FRACTURE SURGERY    . KNEE SURGERY Right   . NECK SURGERY    . SHOULDER SURGERY Left      OB History   No obstetric history on file.     History reviewed. No pertinent family history.  Social History   Tobacco Use  . Smoking status: Former Smoker    Types: Cigarettes  . Smokeless tobacco: Never Used  Substance Use Topics  . Alcohol use: No  . Drug use: No    Home Medications Prior to Admission medications   Medication Sig Start Date End Date Taking? Authorizing Provider  acetaminophen (TYLENOL) 325 MG tablet Take 2 tablets (650 mg total) by mouth every 6  (six) hours as needed for mild pain, fever or headache (or Fever >/= 101). 12/30/18   Roxan Hockey, MD  amLODipine (NORVASC) 5 MG tablet TAKE 1 TABLET DAILY FOR BLOOD PRESSURE 04/24/19   Perlie Mayo, NP  aspirin 325 MG EC tablet Take 1 tablet (325 mg total) by mouth daily with breakfast. 12/30/18   Roxan Hockey, MD  atorvastatin (LIPITOR) 40 MG tablet Take 1 tablet (40 mg total) by mouth daily at 6 PM. 03/27/19 04/26/19  Perlie Mayo, NP  lisinopril (ZESTRIL) 10 MG tablet Take 1 tablet (10 mg total) by mouth daily. For BP 01/24/19   Perlie Mayo, NP  LORazepam (ATIVAN) 0.5 MG tablet Take 1 tablet (0.5 mg total) by mouth daily as needed for anxiety. Take 30-1 hour prior to needing shower 12/12/18   Perlie Mayo, NP  metFORMIN (GLUCOPHAGE) 500 MG tablet Take 1 tablet (500 mg total) by mouth daily. 01/10/19 01/10/20  Perlie Mayo, NP  methimazole (TAPAZOLE) 5 MG tablet Take 1 tablet (5 mg total) by mouth daily. 02/28/19   Cassandria Anger, MD  potassium chloride (KLOR-CON) 10 MEQ tablet Take 1 tablet (10 mEq total) by mouth daily. 04/24/19   Perlie Mayo, NP  propranolol (INDERAL) 20 MG tablet Take 1 tablet (20 mg total) by mouth 2 (two) times  daily. 02/07/19   Roma Kayser, MD  Vitamin D, Ergocalciferol, (DRISDOL) 1.25 MG (50000 UT) CAPS capsule Take 1 capsule (50,000 Units total) by mouth every 7 (seven) days. 12/13/18   Freddy Finner, NP    Allergies    Latex  Review of Systems   Review of Systems  Unable to perform ROS: Dementia  Constitutional: Negative for fever.  HENT: Negative for congestion.   Respiratory: Negative for cough.   Cardiovascular: Negative for chest pain and leg swelling.  Gastrointestinal: Negative for abdominal pain and vomiting.  Genitourinary: Negative for dysuria.  Musculoskeletal: Negative for back pain and neck pain.  Skin: Negative for rash.  Neurological: Negative for headaches.  Hematological: Does not bruise/bleed easily.    Psychiatric/Behavioral: Positive for confusion.    Physical Exam Updated Vital Signs BP 107/73   Pulse (!) 102   Resp 14   SpO2 93%   Physical Exam Vitals and nursing note reviewed.  Constitutional:      General: She is not in acute distress.    Appearance: Normal appearance. She is well-developed.  HENT:     Head: Normocephalic and atraumatic.  Eyes:     Extraocular Movements: Extraocular movements intact.     Conjunctiva/sclera: Conjunctivae normal.     Pupils: Pupils are equal, round, and reactive to light.  Cardiovascular:     Rate and Rhythm: Normal rate and regular rhythm.     Heart sounds: No murmur.  Pulmonary:     Effort: Pulmonary effort is normal. No respiratory distress.     Breath sounds: Normal breath sounds.  Abdominal:     Palpations: Abdomen is soft.     Tenderness: There is no abdominal tenderness.  Musculoskeletal:        General: No tenderness or signs of injury. Normal range of motion.     Cervical back: Normal range of motion and neck supple.     Comments: Patient moving all 4 extremities no evidence of any deformity.  No evidence of any hip pain.  Patient was ambulatory prior to arrival.  Skin:    General: Skin is warm and dry.  Neurological:     General: No focal deficit present.     Mental Status: She is alert. Mental status is at baseline.     Motor: No weakness.     ED Results / Procedures / Treatments   Labs (all labs ordered are listed, but only abnormal results are displayed) Labs Reviewed - No data to display  EKG None  Radiology CT Head Wo Contrast  Result Date: 05/13/2019 CLINICAL DATA:  Pain following fall EXAM: CT HEAD WITHOUT CONTRAST TECHNIQUE: Contiguous axial images were obtained from the base of the skull through the vertex without intravenous contrast. COMPARISON:  December 28, 2018 FINDINGS: Brain: There is moderate diffuse atrophy. There is enlargement each frontal horn of the lateral ventricles on an ex vacuo basis,  stable. There is no intracranial mass, hemorrhage, extra-axial fluid collection, or midline shift. Small vessel disease is noted in the centra semiovale bilaterally with evidence of prior white matter infarcts in the anterior right centra semiovale bilaterally, stable. No acute infarct is evident on this study. Vascular: There is no hyperdense vessel. There is calcification in each carotid siphon region. Skull: The bony calvarium a appears intact. Sinuses/Orbits: There is mucosal thickening in several ethmoid air cells. There is a concha bullosa on the left, an anatomic variant. Visualized orbits appear symmetric bilaterally. Other: Mastoid air cells are clear. IMPRESSION: Moderate  diffuse atrophy, stable. There is a degree of ex vacuo enlargement of the frontal horn of each lateral ventricle. 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 No mass, hemorrhage, or acute appearing infarct. There are foci of arterial vascular calcification. There is mucosal thickening in several ethmoid air cells. Electronically Signed   By: Bretta Bang III M.D.   On: 05/13/2019 14:24    Procedures Procedures (including critical care time)  Medications Ordered in ED Medications - No data to display  ED Course  I have reviewed the triage vital signs and the nursing notes.  Pertinent labs & imaging results that were available during my care of the patient were reviewed by me and considered in my medical decision making (see chart for details).    MDM Rules/Calculators/A&P                      Husband confirms that patient is at baseline mentally.  CT head done just to be careful.  A head CT had no acute injuries.  Patient stable for discharge back home.  No evidence of any lower extremity or upper extremity injuries no concern for hip injury.   Final Clinical Impression(s) / ED Diagnoses Final diagnoses:  Fall, initial encounter    Dementia without behavioral disturbance, unspecified dementia type [facility])    Rx / DC Orders ED Discharge Orders    None       Vanetta Mulders, MD 05/13/19 1452

## 2019-05-13 NOTE — ED Triage Notes (Signed)
Pt's husband stated that pt fell after getting up from the recliner today.  Husband denies pt hitting her head. Husband unable to state if pt is on blood thinners or not. Husband is poor historian and pt is nonverbal.

## 2019-05-13 NOTE — Discharge Instructions (Addendum)
CT of the head without any acute injury findings.  Stable for discharge home.  Return for any new or worse symptoms.

## 2019-05-15 ENCOUNTER — Telehealth: Payer: Self-pay

## 2019-05-15 NOTE — Telephone Encounter (Signed)
Patient's spouse is wanting your input on placing patient in a nursing home. He is just unable to take care of her anymore. She won't eat, she won't bathe. She hasn't physically gotten into the shower or bath tub in a year. He has to hand bathe her and it is all just too much for him to handle. He is wanting some insight and any information you can offer.

## 2019-05-15 NOTE — Telephone Encounter (Signed)
Please call Steward Drone regarding Mrs Vasseur

## 2019-05-16 NOTE — Telephone Encounter (Signed)
Patient is scheduled for 1/20 at 10am. Caregiver stated patient is starting to bite nails down to where they are bleeding and she won't let them wash her hands. Just wanted to let you know.

## 2019-05-18 ENCOUNTER — Telehealth: Payer: Self-pay | Admitting: *Deleted

## 2019-05-18 ENCOUNTER — Emergency Department (HOSPITAL_COMMUNITY): Payer: Medicare Other

## 2019-05-18 ENCOUNTER — Other Ambulatory Visit: Payer: Self-pay

## 2019-05-18 ENCOUNTER — Inpatient Hospital Stay (HOSPITAL_COMMUNITY)
Admission: EM | Admit: 2019-05-18 | Discharge: 2019-05-23 | DRG: 641 | Disposition: A | Discharge status: Home Health | Payer: Medicare Other | Attending: Internal Medicine | Admitting: Internal Medicine

## 2019-05-18 DIAGNOSIS — G3109 Other frontotemporal dementia: Secondary | ICD-10-CM | POA: Diagnosis present

## 2019-05-18 DIAGNOSIS — Z79899 Other long term (current) drug therapy: Secondary | ICD-10-CM | POA: Diagnosis not present

## 2019-05-18 DIAGNOSIS — E878 Other disorders of electrolyte and fluid balance, not elsewhere classified: Secondary | ICD-10-CM | POA: Diagnosis present

## 2019-05-18 DIAGNOSIS — Z87891 Personal history of nicotine dependence: Secondary | ICD-10-CM | POA: Diagnosis not present

## 2019-05-18 DIAGNOSIS — Z7982 Long term (current) use of aspirin: Secondary | ICD-10-CM | POA: Diagnosis not present

## 2019-05-18 DIAGNOSIS — Z91040 Latex allergy status: Secondary | ICD-10-CM | POA: Diagnosis not present

## 2019-05-18 DIAGNOSIS — Z20822 Contact with and (suspected) exposure to covid-19: Secondary | ICD-10-CM | POA: Diagnosis present

## 2019-05-18 DIAGNOSIS — I959 Hypotension, unspecified: Secondary | ICD-10-CM | POA: Diagnosis present

## 2019-05-18 DIAGNOSIS — I10 Essential (primary) hypertension: Secondary | ICD-10-CM | POA: Diagnosis present

## 2019-05-18 DIAGNOSIS — E860 Dehydration: Secondary | ICD-10-CM | POA: Diagnosis present

## 2019-05-18 DIAGNOSIS — R9431 Abnormal electrocardiogram [ECG] [EKG]: Secondary | ICD-10-CM | POA: Diagnosis present

## 2019-05-18 DIAGNOSIS — E1151 Type 2 diabetes mellitus with diabetic peripheral angiopathy without gangrene: Secondary | ICD-10-CM | POA: Diagnosis present

## 2019-05-18 DIAGNOSIS — R410 Disorientation, unspecified: Secondary | ICD-10-CM

## 2019-05-18 DIAGNOSIS — F0280 Dementia in other diseases classified elsewhere without behavioral disturbance: Secondary | ICD-10-CM | POA: Diagnosis present

## 2019-05-18 DIAGNOSIS — E870 Hyperosmolality and hypernatremia: Secondary | ICD-10-CM | POA: Diagnosis present

## 2019-05-18 DIAGNOSIS — F0150 Vascular dementia without behavioral disturbance: Secondary | ICD-10-CM | POA: Diagnosis present

## 2019-05-18 DIAGNOSIS — E876 Hypokalemia: Secondary | ICD-10-CM | POA: Diagnosis present

## 2019-05-18 DIAGNOSIS — F05 Delirium due to known physiological condition: Secondary | ICD-10-CM | POA: Diagnosis present

## 2019-05-18 DIAGNOSIS — E039 Hypothyroidism, unspecified: Secondary | ICD-10-CM | POA: Diagnosis present

## 2019-05-18 DIAGNOSIS — Z7984 Long term (current) use of oral hypoglycemic drugs: Secondary | ICD-10-CM

## 2019-05-18 DIAGNOSIS — N390 Urinary tract infection, site not specified: Secondary | ICD-10-CM | POA: Diagnosis present

## 2019-05-18 HISTORY — DX: Thyrotoxicosis, unspecified without thyrotoxic crisis or storm: E05.90

## 2019-05-18 LAB — CBC WITH DIFFERENTIAL/PLATELET
Abs Immature Granulocytes: 0.02 10*3/uL (ref 0.00–0.07)
Basophils Absolute: 0 10*3/uL (ref 0.0–0.1)
Basophils Relative: 0 %
Eosinophils Absolute: 0.1 10*3/uL (ref 0.0–0.5)
Eosinophils Relative: 1 %
HCT: 47 % — ABNORMAL HIGH (ref 36.0–46.0)
Hemoglobin: 14.2 g/dL (ref 12.0–15.0)
Immature Granulocytes: 0 %
Lymphocytes Relative: 36 %
Lymphs Abs: 2.7 10*3/uL (ref 0.7–4.0)
MCH: 30.5 pg (ref 26.0–34.0)
MCHC: 30.2 g/dL (ref 30.0–36.0)
MCV: 100.9 fL — ABNORMAL HIGH (ref 80.0–100.0)
Monocytes Absolute: 0.5 10*3/uL (ref 0.1–1.0)
Monocytes Relative: 6 %
Neutro Abs: 4.2 10*3/uL (ref 1.7–7.7)
Neutrophils Relative %: 57 %
Platelets: 245 10*3/uL (ref 150–400)
RBC: 4.66 MIL/uL (ref 3.87–5.11)
RDW: 16.3 % — ABNORMAL HIGH (ref 11.5–15.5)
WBC: 7.5 10*3/uL (ref 4.0–10.5)
nRBC: 0 % (ref 0.0–0.2)

## 2019-05-18 LAB — COMPREHENSIVE METABOLIC PANEL
ALT: 33 U/L (ref 0–44)
AST: 31 U/L (ref 15–41)
Albumin: 4.2 g/dL (ref 3.5–5.0)
Alkaline Phosphatase: 70 U/L (ref 38–126)
Anion gap: 6 (ref 5–15)
BUN: 34 mg/dL — ABNORMAL HIGH (ref 6–20)
CO2: 31 mmol/L (ref 22–32)
Calcium: 9 mg/dL (ref 8.9–10.3)
Chloride: 128 mmol/L — ABNORMAL HIGH (ref 98–111)
Creatinine, Ser: 0.81 mg/dL (ref 0.44–1.00)
GFR calc Af Amer: >60 mL/min (ref >60)
GFR calc non Af Amer: >60 mL/min (ref >60)
Glucose, Bld: 187 mg/dL — ABNORMAL HIGH (ref 70–99)
Potassium: 3.1 mmol/L — ABNORMAL LOW (ref 3.5–5.1)
Sodium: 165 mmol/L (ref 135–145)
Total Bilirubin: 1.1 mg/dL (ref 0.3–1.2)
Total Protein: 7.5 g/dL (ref 6.5–8.1)

## 2019-05-18 LAB — LIPASE, BLOOD: Lipase: 64 U/L — ABNORMAL HIGH (ref 11–51)

## 2019-05-18 LAB — URINALYSIS, ROUTINE W REFLEX MICROSCOPIC
Glucose, UA: NEGATIVE mg/dL
Hgb urine dipstick: NEGATIVE
Ketones, ur: 5 mg/dL — AB
Nitrite: NEGATIVE
Protein, ur: 30 mg/dL — AB
Specific Gravity, Urine: 1.034 — ABNORMAL HIGH (ref 1.005–1.030)
pH: 5 (ref 5.0–8.0)

## 2019-05-18 LAB — RESPIRATORY PANEL BY RT PCR (FLU A&B, COVID)
Influenza A by PCR: NEGATIVE
Influenza B by PCR: NEGATIVE
SARS Coronavirus 2 by RT PCR: NEGATIVE

## 2019-05-18 LAB — TSH: TSH: 0.022 u[IU]/mL — ABNORMAL LOW (ref 0.350–4.500)

## 2019-05-18 LAB — CBG MONITORING, ED: Glucose-Capillary: 163 mg/dL — ABNORMAL HIGH (ref 70–99)

## 2019-05-18 MED ORDER — SODIUM CHLORIDE 0.9 % IV SOLN
1.0000 g | Freq: Once | INTRAVENOUS | Status: AC
  Administered 2019-05-18: 1 g via INTRAVENOUS
  Filled 2019-05-18: qty 10

## 2019-05-18 MED ORDER — SODIUM CHLORIDE 0.9 % IV BOLUS
1000.0000 mL | Freq: Once | INTRAVENOUS | Status: AC
  Administered 2019-05-18: 1000 mL via INTRAVENOUS

## 2019-05-18 MED ORDER — ATORVASTATIN CALCIUM 40 MG PO TABS
40.0000 mg | ORAL_TABLET | Freq: Every day | ORAL | Status: DC
  Administered 2019-05-19 – 2019-05-22 (×4): 40 mg via ORAL
  Filled 2019-05-18 (×4): qty 1

## 2019-05-18 MED ORDER — POTASSIUM CHLORIDE 10 MEQ/100ML IV SOLN
10.0000 meq | Freq: Once | INTRAVENOUS | Status: AC
  Administered 2019-05-18: 10 meq via INTRAVENOUS
  Filled 2019-05-18: qty 100

## 2019-05-18 MED ORDER — SODIUM CHLORIDE 0.9 % IV SOLN
1.0000 g | INTRAVENOUS | Status: DC
  Administered 2019-05-19: 1 g via INTRAVENOUS
  Filled 2019-05-18: qty 10

## 2019-05-18 MED ORDER — ONDANSETRON HCL 4 MG/2ML IJ SOLN: 4.0000 mg | Freq: Four times a day (QID) | INTRAMUSCULAR | Status: DC | PRN

## 2019-05-18 MED ORDER — INSULIN ASPART 100 UNIT/ML ~~LOC~~ SOLN
0.0000 [IU] | Freq: Three times a day (TID) | SUBCUTANEOUS | Status: DC
  Administered 2019-05-19 – 2019-05-21 (×5): 1 [IU] via SUBCUTANEOUS
  Administered 2019-05-21 – 2019-05-22 (×2): 2 [IU] via SUBCUTANEOUS

## 2019-05-18 MED ORDER — ENOXAPARIN SODIUM 40 MG/0.4ML ~~LOC~~ SOLN
40.0000 mg | SUBCUTANEOUS | Status: DC
  Administered 2019-05-19 – 2019-05-23 (×4): 40 mg via SUBCUTANEOUS
  Filled 2019-05-18 (×5): qty 0.4

## 2019-05-18 MED ORDER — POLYETHYLENE GLYCOL 3350 17 G PO PACK: 17.0000 g | PACK | Freq: Every day | ORAL | Status: DC | PRN

## 2019-05-18 MED ORDER — ONDANSETRON HCL 4 MG PO TABS: 4.0000 mg | ORAL_TABLET | Freq: Four times a day (QID) | ORAL | Status: DC | PRN

## 2019-05-18 MED ORDER — MAGNESIUM SULFATE IN D5W 1-5 GM/100ML-% IV SOLN
1.0000 g | Freq: Once | INTRAVENOUS | Status: AC
  Administered 2019-05-18: 1 g via INTRAVENOUS
  Filled 2019-05-18: qty 100

## 2019-05-18 MED ORDER — ACETAMINOPHEN 650 MG RE SUPP: 650.0000 mg | Freq: Four times a day (QID) | RECTAL | Status: DC | PRN

## 2019-05-18 MED ORDER — POTASSIUM CHLORIDE CRYS ER 20 MEQ PO TBCR
40.0000 meq | EXTENDED_RELEASE_TABLET | Freq: Once | ORAL | Status: DC
  Filled 2019-05-18: qty 2

## 2019-05-18 MED ORDER — POTASSIUM CL IN DEXTROSE 5% 20 MEQ/L IV SOLN
20.0000 meq | INTRAVENOUS | Status: DC
  Filled 2019-05-18 (×2): qty 1000

## 2019-05-18 MED ORDER — POTASSIUM CHLORIDE IN NACL 20-0.45 MEQ/L-% IV SOLN
INTRAVENOUS | Status: DC
  Filled 2019-05-18: qty 2000
  Filled 2019-05-18 (×3): qty 1000

## 2019-05-18 MED ORDER — SODIUM CHLORIDE 0.9 % IV BOLUS
1000.0000 mL | Freq: Once | INTRAVENOUS | Status: AC
  Administered 2019-05-18: 17:00:00 1000 mL via INTRAVENOUS

## 2019-05-18 MED ORDER — INSULIN ASPART 100 UNIT/ML ~~LOC~~ SOLN
0.0000 [IU] | Freq: Every day | SUBCUTANEOUS | Status: DC
  Administered 2019-05-19: 2 [IU] via SUBCUTANEOUS

## 2019-05-18 MED ORDER — ACETAMINOPHEN 325 MG PO TABS: 650.0000 mg | ORAL_TABLET | Freq: Four times a day (QID) | ORAL | Status: DC | PRN

## 2019-05-18 MED ORDER — ASPIRIN EC 325 MG PO TBEC
325.0000 mg | DELAYED_RELEASE_TABLET | Freq: Every day | ORAL | Status: DC
  Administered 2019-05-19 – 2019-05-23 (×5): 325 mg via ORAL
  Filled 2019-05-18 (×5): qty 1

## 2019-05-18 NOTE — H&P (Addendum)
History and Physical    [name] BTD:[id number] DOB: 05/03/1961 DOA: 05/18/2019  PCP: Freddy Finner, NP   Patient coming from: Home  I have personally briefly reviewed patient's old medical records in [facility]  Chief Complaint: Weakness, not eating.  HPI: Monica Ayers is a 59 y.o. female with medical history significant for  Vascular dementia, hyper thyroidism, diabetes mellitus, hypertension.  Patient at baseline is nonverbal, also does not appear to understand spoken words.  History is obtained from chart review.  It was sent to the ED reports that over the past 5 days, patient has not been eating or drinking, also she has been weak and unable to stand.  Spouse also reported a strong odor to patient's urine and urine appearing dark red.  Patient spouse is unable to care for patient at home due to progressive worsening of her condition.  No reports of fever or cough.   I spoke to patient spouse, he confirmed history.  He reports at baseline patient ambulates by herself, but he sometimes supports her, though they do have a wheelchair. Also she understands what she has been told even though she is not able to talk.  ED Course:  temp 96.9, blood pressure systolic 91/70.  Sodium 165.  Potassium 3.1.  UA moderate leukocytes rare bacteria.  Portable chest x-ray without acute abnormality.  WBC 7.5.  2 L bolus normal saline given.  Hospitalist to admit for further evaluation and management.  Review of Systems: Unable to help assess due to baseline dementia.  Past Medical History:  Diagnosis Date  . Allergy   . Dementia ([facility])   . Diabetes mellitus without complication ([facility])   . Hypertension     Past Surgical History:  Procedure Laterality Date  . FRACTURE SURGERY    . KNEE SURGERY Right   . NECK SURGERY    . SHOULDER SURGERY Left      reports that she has quit smoking. Her smoking use included cigarettes. She has never used smokeless tobacco. She reports that she  does not drink alcohol or use drugs.  Allergies  Allergen Reactions  . Latex Rash    No family history on file.  Prior to Admission medications   Medication Sig Start Date End Date Taking? Authorizing Provider  acetaminophen (TYLENOL) 325 MG tablet Take 2 tablets (650 mg total) by mouth every 6 (six) hours as needed for mild pain, fever or headache (or Fever >/= 101). 12/30/18  Yes Jaziya Obarr, [place], MD  amLODipine (NORVASC) 5 MG tablet TAKE 1 TABLET DAILY FOR BLOOD PRESSURE Patient taking differently: Take 5 mg by mouth every morning.  04/24/19  Yes Freddy Finner, NP  aspirin 325 MG EC tablet Take 1 tablet (325 mg total) by mouth daily with breakfast. Patient taking differently: Take 325 mg by mouth every morning.  12/30/18  Yes Shon Hale, MD  atorvastatin (LIPITOR) 40 MG tablet Take 1 tablet (40 mg total) by mouth daily at 6 PM. 03/27/19 05/18/19 Yes Freddy Finner, NP  lisinopril (ZESTRIL) 10 MG tablet Take 1 tablet (10 mg total) by mouth daily. For BP 01/24/19  Yes Freddy Finner, NP  metFORMIN (GLUCOPHAGE) 500 MG tablet Take 1 tablet (500 mg total) by mouth daily. Patient taking differently: Take 500 mg by mouth every morning.  01/10/19 01/10/20 Yes Freddy Finner, NP  potassium chloride (KLOR-CON) 10 MEQ tablet Take 1 tablet (10 mEq total) by mouth daily. Patient taking differently: Take 10 mEq by  mouth every morning.  04/24/19  Yes Perlie Mayo, NP  propranolol (INDERAL) 20 MG tablet Take 1 tablet (20 mg total) by mouth 2 (two) times daily. Patient taking differently: Take 20 mg by mouth every morning.  02/07/19  Yes Cassandria Anger, MD    Physical Exam: Exam limited by patient's baseline dementia, patient also refusing most of my  exam and not following directions  Vitals:   05/18/19 1300 05/18/19 1305 05/18/19 1840  BP:  91/70   Pulse:  82   Resp:  12   Temp:   (!) 96.9 F (36.1 C)  TempSrc:   Tympanic  SpO2:  98%   Weight: 65.1 kg      Constitutional:  Nonverbal, calm, comfortable Vitals:   05/18/19 1300 05/18/19 1305 05/18/19 1840  BP:  91/70   Pulse:  82   Resp:  12   Temp:   (!) 96.9 F (36.1 C)  TempSrc:   Tympanic  SpO2:  98%   Weight: 65.1 kg     Eyes: PERRL, lids and conjunctivae normal ENMT: Mucous membranes are very dry Neck: normal, supple, no masses, no thyromegaly Respiratory: Normal respiratory effort. No accessory muscle use.  Cardiovascular: Regular rate and rhythm,. No extremity edema. 2+ pedal pulses.   Abdomen: Refusing visual exam, but no tenderness appreciated, no masses palpated. No hepatosplenomegaly. Bowel sounds positive.  Musculoskeletal: no clubbing / cyanosis. No joint deformity upper and lower extremities. Good ROM, no contractures. Normal muscle tone.  Skin: no rashes, lesions, ulcers. No induration Neurologic: Awake, but refusing exam. Psychiatric: Awake and alert, unable to assess, patient nonverbal.  Labs on Admission: I have personally reviewed following labs and imaging studies  CBC: Recent Labs  Lab 05/18/19 1447  WBC 7.5  NEUTROABS 4.2  HGB 14.2  HCT 47.0*  MCV 100.9*  PLT 616   Basic Metabolic Panel: Recent Labs  Lab 05/18/19 1447  NA 165*  K 3.1*  CL 128*  CO2 31  GLUCOSE 187*  BUN 34*  CREATININE 0.81  CALCIUM 9.0   Liver Function Tests: Recent Labs  Lab 05/18/19 1447  AST 31  ALT 33  ALKPHOS 70  BILITOT 1.1  PROT 7.5  ALBUMIN 4.2   Recent Labs  Lab 05/18/19 1447  LIPASE 64*   CBG: Recent Labs  Lab 05/18/19 1347  GLUCAP 163*   Urine analysis:    Component Value Date/Time   COLORURINE AMBER (A) 05/18/2019 1720   APPEARANCEUR HAZY (A) 05/18/2019 1720   LABSPEC 1.034 (H) 05/18/2019 1720   PHURINE 5.0 05/18/2019 1720   GLUCOSEU NEGATIVE 05/18/2019 1720   HGBUR NEGATIVE 05/18/2019 1720   BILIRUBINUR SMALL (A) 05/18/2019 1720   KETONESUR 5 (A) 05/18/2019 1720   PROTEINUR 30 (A) 05/18/2019 1720   NITRITE NEGATIVE 05/18/2019 1720   LEUKOCYTESUR  MODERATE (A) 05/18/2019 1720    Radiological Exams on Admission: DG Chest Portable 1 View  Result Date: 05/18/2019 CLINICAL DATA:  Generalized weakness and dehydration EXAM: PORTABLE CHEST 1 VIEW COMPARISON:  December 28, 2018. FINDINGS: Lungs are clear. Heart size and pulmonary vascularity are normal. No adenopathy. There is postoperative change in the lower cervical region. IMPRESSION: Lungs clear.  Cardiac silhouette normal.  No adenopathy. Electronically Signed   By: Lowella Grip III M.D.   On: 05/18/2019 14:34    EKG: Independently reviewed.  Sinus rhythm with premature atrial contractions.  No significant change from prior.  Assessment/Plan Active Problems:   Hypernatremia  Hypernatremia- sodium 165.  With  blood pressure 91/70.  Appears dehydrated. Not eating for at least 5 days.  But creatinine at baseline 0.8.  Calculated free water deficit is 2.6 L. - 2 Bolus normal saline given in ED, continue 1/2 N/s + 20 KCL 100cc/hr -BMP every 4 hourly x 4 -Encourage free water intake.  Hypokalemia-potassium 3.1.  Likely from poor p.o. intake. -Replete K -BMP a.m. -Check magnesium -1 mg magnesium sulfate given.  UTI-with generalized weakness.  WBC 7.5.  Patient rules out for sepsis.  Low blood pressure likely from poor p.o. intake. -Continue IV ceftriaxone -Follow-up urine cultures - PT eval  Hypertension-blood pressure systolic 91/70.  Hypotensive to soft blood pressure likely from poor p.o. intake over the past several days. -Hold home Norvasc, lisinopril, propranolol.  Diabetes mellitus- random glucose 187. - SSI- s - HgBa1c -Hold home Metformin  Vascular dementia- patient is nonverbal. -Resume home aspirin, statins.   DVT prophylaxis: Lovenox  code Status: Full code Family Communication: None at bedside. Called patients spouse on Maysoon Lozada, on the phone. Plan of care explained, all questions answered. Disposition Plan: > 2 days Consults called: None Admission  status: InPatient, telemetry I certify that at the point of admission it is my clinical judgment that the patient will require inpatient hospital care spanning beyond 2 midnights from the point of admission due to high intensity of service, high risk for further deterioration and high frequency of surveillance required. The following factors support the patient status of inpatient: Electrolyte abnormalities and urinary tract infection, in the setting of baseline dementia.  Requiring intravenous antibiotics close BMP monitoring.   Onnie Boer MD [facility]  05/18/2019, 8:27 PM

## 2019-05-18 NOTE — Telephone Encounter (Signed)
Gweneth Fritter called requesting someone call Monica Ayers the spouse back as he is getting very concerned about pt

## 2019-05-18 NOTE — ED Provider Notes (Signed)
[facility] EMERGENCY DEPARTMENT Provider Note   CSN: [id number] Arrival date & time: 05/18/19  1246     History Chief Complaint  Patient presents with  . Weakness    Monica Ayers is a 59 y.o. female.  The history is provided by the spouse and medical records. No language interpreter was used.  Weakness    59 year old female significant history of frontal lobe dementia, diabetes, hypertension, hypothyroidism brought here by her husband for evaluation of generalized weakness.  History obtained through husband as patient is mostly nonverbal.  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  Since then, patient has lost approximately 15 pounds of weights.  For the past several weeks she is not eating and drinking much.  He noticed that his urine is dark red with strong odor.  She is normally able to ambulate however now she is too weak to walk without help.  This morning while trying to get her into the car, she nearly fell when her leg gave out.  She normally does not communicate, tends to hit herself and by her phone.  She has been taking the medication regularly that he gave her.  Patient was seen in the ER several days for same but was subsequently sent home after a head CT scan was negative.  Husband felt he is unable to care for her at home due to her progressive worsening condition.  No report of any fever or significant cough of patient appears to be in any new pain.  He felt that she is dehydrated.  No report of tobacco or alcohol abuse.    Past Medical History:  Diagnosis Date  . Allergy   . Dementia (HCC)   . Diabetes mellitus without complication (HCC)   . Hypertension     Patient Active Problem List   Diagnosis Date Noted  . Hyperthyroidism 01/18/2019  . Sepsis (HCC) 12/28/2018  . Hypokalemia   . Unsteady gait 12/13/2018  . Falls frequently 12/13/2018  . Pressure injury of skin  11/18/2018  . Transaminasemia 11/18/2018  . Sepsis due to undetermined organism (HCC) 11/17/2018  . Lactic acidosis 11/17/2018  . Type 2 diabetes mellitus without complication, without long-term current use of insulin (HCC) 11/17/2018  . 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 Essential hypertension 10/02/2016  . Allergic state 10/02/2016    Past Surgical History:  Procedure Laterality Date  . FRACTURE SURGERY    . KNEE SURGERY Right   . NECK SURGERY    . SHOULDER SURGERY Left      OB History   No obstetric history on file.     No family history on file.  Social History   Tobacco Use  . Smoking status: Former Smoker    Types: Cigarettes  . Smokeless tobacco: Never Used  Substance Use Topics  . Alcohol use: No  . Drug use: No    Home Medications Prior to Admission medications   Medication Sig Start Date End Date Taking? Authorizing Provider  acetaminophen (TYLENOL) 325 MG tablet Take 2 tablets (650 mg total) by mouth every 6 (six) hours as needed for mild pain, fever or headache (or Fever >/= 101). 12/30/18   Shon Hale, MD  amLODipine (NORVASC) 5 MG tablet TAKE 1 TABLET DAILY FOR BLOOD PRESSURE 04/24/19   Freddy Finner, NP  aspirin 325 MG EC tablet Take 1 tablet (325 mg total) by mouth daily with breakfast. 12/30/18  Shon Hale, MD  atorvastatin (LIPITOR) 40 MG tablet Take 1 tablet (40 mg total) by mouth daily at 6 PM. 03/27/19 04/26/19  Freddy Finner, NP  lisinopril (ZESTRIL) 10 MG tablet Take 1 tablet (10 mg total) by mouth daily. For BP 01/24/19   Freddy Finner, NP  LORazepam (ATIVAN) 0.5 MG tablet Take 1 tablet (0.5 mg total) by mouth daily as needed for anxiety. Take 30-1 hour prior to needing shower 12/12/18   Freddy Finner, NP  metFORMIN (GLUCOPHAGE) 500 MG tablet Take 1 tablet (500 mg total) by mouth daily. 01/10/19 01/10/20  Freddy Finner, NP  methimazole (TAPAZOLE) 5 MG tablet Take 1 tablet (5 mg total) by mouth daily. 02/28/19   Roma Kayser, MD  potassium chloride (KLOR-CON) 10 MEQ tablet Take 1 tablet (10 mEq total) by mouth daily. 04/24/19   Freddy Finner, NP  propranolol (INDERAL) 20 MG tablet Take 1 tablet (20 mg total) by mouth 2 (two) times daily. 02/07/19   Roma Kayser, MD  Vitamin D, Ergocalciferol, (DRISDOL) 1.25 MG (50000 UT) CAPS capsule Take 1 capsule (50,000 Units total) by mouth every 7 (seven) days. 12/13/18   Freddy Finner, NP    Allergies    Latex  Review of Systems   Review of Systems  Unable to perform ROS: Dementia  Neurological: Positive for weakness.    Physical Exam Updated Vital Signs BP 91/70 (BP Location: Right Arm)   Pulse 82   Resp 12   Wt 65.1 kg   SpO2 98%   BMI 22.48 kg/m   Physical Exam Vitals and nursing note reviewed.  Constitutional:      General: She is not in acute distress.    Appearance: She is well-developed.     Comments: Patient appears disheveled, dehydrated, and does not follow any verbal instruction.  She does not appear to be in any acute distress.  HENT:     Head: Atraumatic.     Mouth/Throat:     Comments: Mouth is dry. Eyes:     Conjunctiva/sclera: Conjunctivae normal.     Pupils: Pupils are equal, round, and reactive to light.  Cardiovascular:     Rate and Rhythm: Normal rate and regular rhythm.     Pulses: Normal pulses.     Heart sounds: Normal heart sounds.  Pulmonary:     Effort: Pulmonary effort is normal.     Breath sounds: Normal breath sounds. No wheezing, rhonchi or rales.  Abdominal:     Palpations: Abdomen is soft.     Tenderness: There is no abdominal tenderness.  Musculoskeletal:     Cervical back: Neck supple.  Skin:    Findings: No rash.  Neurological:     Mental Status: She is alert.     GCS: GCS eye subscore is 4. GCS verbal subscore is 2. GCS motor subscore is 5.  Psychiatric:        Mood and Affect: Affect is flat.        Speech: She is noncommunicative.     ED Results / Procedures / Treatments     Labs (all labs ordered are listed, but only abnormal results are displayed) Labs Reviewed  CBC WITH DIFFERENTIAL/PLATELET - Abnormal; Notable for the following components:      Result Value   HCT 47.0 (*)    MCV 100.9 (*)    RDW 16.3 (*)    All other components within normal limits  COMPREHENSIVE METABOLIC PANEL - Abnormal; Notable  for the following components:   Sodium 165 (*)    Potassium 3.1 (*)    Chloride 128 (*)    Glucose, Bld 187 (*)    BUN 34 (*)    All other components within normal limits  LIPASE, BLOOD - Abnormal; Notable for the following components:   Lipase 64 (*)    All other components within normal limits  URINALYSIS, ROUTINE W REFLEX MICROSCOPIC - Abnormal; Notable for the following components:   Color, Urine AMBER (*)    APPearance HAZY (*)    Specific Gravity, Urine 1.034 (*)    Bilirubin Urine SMALL (*)    Ketones, ur 5 (*)    Protein, ur 30 (*)    Leukocytes,Ua MODERATE (*)    Bacteria, UA RARE (*)    All other components within normal limits  CBG MONITORING, ED - Abnormal; Notable for the following components:   Glucose-Capillary 163 (*)    All other components within normal limits  RESPIRATORY PANEL BY RT PCR (FLU A&B, COVID)  URINE CULTURE  TSH    EKG EKG Interpretation  Date/Time:  Thursday May 18 2019 13:56:30 EST Ventricular Rate:  75 PR Interval:    QRS Duration: 95 QT Interval:  440 QTC Calculation: 492 R Axis:   60 Text Interpretation: Sinus rhythm Atrial premature complex Borderline prolonged QT interval Prolonged QT Since last tracing Rate slower Confirmed by Eber Hong ([id number]) on 05/18/2019 3:37:39 PM   Radiology DG Chest Portable 1 View  Result Date: 05/18/2019 CLINICAL DATA:  Generalized weakness and dehydration EXAM: PORTABLE CHEST 1 VIEW COMPARISON:  December 28, 2018. FINDINGS: Lungs are clear. Heart size and pulmonary vascularity are normal. No adenopathy. There is postoperative change in the lower cervical region.  IMPRESSION: Lungs clear.  Cardiac silhouette normal.  No adenopathy. Electronically Signed   By: Bretta Bang III M.D.   On: 05/18/2019 14:34    Procedures .Critical Care Performed by: Fayrene Helper, PA-C Authorized by: Fayrene Helper, PA-C   Critical care provider statement:    Critical care time (minutes):  45   Critical care was time spent personally by me on the following activities:  Discussions with consultants, evaluation of patient's response to treatment, examination of patient, ordering and performing treatments and interventions, ordering and review of laboratory studies, ordering and review of radiographic studies, pulse oximetry, re-evaluation of patient's condition, obtaining history from patient or surrogate and review of old charts   (including critical care time)  Medications Ordered in ED Medications  cefTRIAXone (ROCEPHIN) 1 g in sodium chloride 0.9 % 100 mL IVPB (has no administration in time range)  potassium chloride 10 mEq in 100 mL IVPB (has no administration in time range)  potassium chloride SA (KLOR-CON) CR tablet 40 mEq (has no administration in time range)  sodium chloride 0.9 % bolus 1,000 mL (0 mLs Intravenous Stopped 05/18/19 1654)  sodium chloride 0.9 % bolus 1,000 mL (1,000 mLs Intravenous New Bag/Given 05/18/19 1655)    ED Course  I have reviewed the triage vital signs and the nursing notes.  Pertinent labs & imaging results that were available during my care of the patient were reviewed by me and considered in my medical decision making (see chart for details).    MDM Rules/Calculators/A&P                      BP 91/70 (BP Location: Right Arm)   Pulse 82   Resp 12   Wt 65.1 kg  SpO2 98%   BMI 22.48 kg/m   Final Clinical Impression(s) / ED Diagnoses Final diagnoses:  Hypernatremia  Hyperchloremia  Dehydration  Lower urinary tract infectious disease  Delirium    Rx / DC Orders ED Discharge Orders    None     2:29 PM Patient with  vascular dementia, nonverbal, brought here due to progressive weakness, weight loss, and strong urine odor and signs of dehydration.  Patient was seen 5 days ago due to a fall, head CT scan was performed at that time that was unremarkable.  It appears condition is steadily deteriorating and her husband is unable to care for her at this time.  Work-up initiated.  3:45 PM Labs remarkable for sodium of 165, likely reflecting dehydration.  Potassium of 3.1.  Chloride is elevated at 128.  We will continue with IV hydration, will need to probably check urinalysis.  6:12 PM UA shows evidence of UTI, which likely contribute to pt's current state of health.  Urine culture sent, will treat with Rocephin.  Will consult for admission.   6:24 PM Appreciate consultation from [facility] Dr. Denton Brick who agrees to see and admit pt for delirium 2/2 UTI and severe dehydration with hypernatremia and hyperchloremia.  COVID-19 test pending.   Monica Ayers was evaluated in Emergency Department on 05/18/2019 for the symptoms described in the history of present illness. She was evaluated in the context of the global COVID-19 pandemic, which necessitated consideration that the patient might be at risk for infection with the SARS-CoV-2 virus that causes COVID-19. Institutional protocols and algorithms that pertain to the evaluation of patients at risk for COVID-19 are in a state of rapid change based on information released by regulatory bodies including the CDC and federal and state organizations. These policies and algorithms were followed during the patient's care in the ED.    Domenic Moras, PA-C 05/18/19 1827    Noemi Chapel, MD 05/18/19 [phone]

## 2019-05-18 NOTE — ED Provider Notes (Signed)
Patient is an ill-appearing 59 year old female, she appears extremely dry, she is altered, she has a history of vascular dementia and unfortunately is not able to give [place] any history however she has had a gradual weight loss, she presents with increasing weakness and confusion.  On exam she has extremely dry mucous membranes, she has a soft nontender abdomen with no edema.  She is moving all 4 extremities but does not follow commands, she pushes me away when I try to examine her however when I am able to auscultate her heart and lungs they are normal in appearance.  She will need IV fluids for her severe hypernatremia and hyperchloremia.  The patient is critically ill appearing with severe electrolyte abnormalities and dehydration, thankfully her renal function is preserved.  She does have a borderline prolonged QT  I agree with the patient's evaluation by the physician assistant including critical care services  Medical screening examination/treatment/procedure(s) were conducted as a shared visit with non-physician practitioner(s) and myself.  I personally evaluated the patient during the encounter.  Clinical Impression:   Final diagnoses:  Hypernatremia  Hyperchloremia  Dehydration  Lower urinary tract infectious disease  Delirium     EKG Interpretation  Date/Time:  Thursday May 18 2019 13:56:30 EST Ventricular Rate:  75 PR Interval:    QRS Duration: 95 QT Interval:  440 QTC Calculation: 492 R Axis:   60 Text Interpretation: Sinus rhythm Atrial premature complex Borderline prolonged QT interval Prolonged QT Since last tracing Rate slower Confirmed by Eber Hong ([id number]) on 05/18/2019 3:37:39 PM         Eber Hong, MD 05/18/19 [phone]

## 2019-05-18 NOTE — ED Notes (Signed)
Patient will not keep monitor leads on and pulled out her IV. Second IV started.

## 2019-05-18 NOTE — ED Notes (Signed)
Hand mittens placed on patient.

## 2019-05-18 NOTE — Telephone Encounter (Signed)
FYI  Spoke with patients spouse and he stated that patient won't eat and can barely stand. I advised him to take patient to ED for evaluation. He stated he had taken her to the hospital last Saturday and they said nothing was wrong with her. I advised him that if patient still was not eating and unable to stand she needed to go back as we didn't have availability this week. All with verbal understanding.

## 2019-05-18 NOTE — ED Triage Notes (Signed)
Pt sent by ems for weakness and not eating for 5 days. Family thinks she is dehydrated.

## 2019-05-19 LAB — BASIC METABOLIC PANEL
Anion gap: 9 (ref 5–15)
BUN: 31 mg/dL — ABNORMAL HIGH (ref 6–20)
BUN: 27 mg/dL — ABNORMAL HIGH (ref 6–20)
CO2: 26 mmol/L (ref 22–32)
CO2: 24 mmol/L (ref 22–32)
Calcium: 8.6 mg/dL — ABNORMAL LOW (ref 8.9–10.3)
Calcium: 8.2 mg/dL — ABNORMAL LOW (ref 8.9–10.3)
Chloride: 131 mmol/L (ref 98–111)
Chloride: >130 mmol/L (ref 98–111)
Creatinine, Ser: 0.75 mg/dL (ref 0.44–1.00)
Creatinine, Ser: 0.75 mg/dL (ref 0.44–1.00)
GFR calc Af Amer: >60 mL/min (ref >60)
GFR calc Af Amer: >60 mL/min (ref >60)
GFR calc non Af Amer: >60 mL/min (ref >60)
GFR calc non Af Amer: >60 mL/min (ref >60)
Glucose, Bld: 183 mg/dL — ABNORMAL HIGH (ref 70–99)
Glucose, Bld: 165 mg/dL — ABNORMAL HIGH (ref 70–99)
Potassium: 2.7 mmol/L — CL (ref 3.5–5.1)
Potassium: 3.3 mmol/L — ABNORMAL LOW (ref 3.5–5.1)
Sodium: 166 mmol/L (ref 135–145)
Sodium: 166 mmol/L (ref 135–145)

## 2019-05-19 LAB — GLUCOSE, CAPILLARY
Glucose-Capillary: 131 mg/dL — ABNORMAL HIGH (ref 70–99)
Glucose-Capillary: 138 mg/dL — ABNORMAL HIGH (ref 70–99)
Glucose-Capillary: 140 mg/dL — ABNORMAL HIGH (ref 70–99)
Glucose-Capillary: 148 mg/dL — ABNORMAL HIGH (ref 70–99)
Glucose-Capillary: 241 mg/dL — ABNORMAL HIGH (ref 70–99)

## 2019-05-19 LAB — HEMOGLOBIN A1C
Hgb A1c MFr Bld: 5.5 % (ref 4.8–5.6)
Mean Plasma Glucose: 111.15 mg/dL

## 2019-05-19 LAB — URINE CULTURE: Culture: NO GROWTH

## 2019-05-19 LAB — CBC
HCT: 42 % (ref 36.0–46.0)
Hemoglobin: 12.6 g/dL (ref 12.0–15.0)
MCH: 30.5 pg (ref 26.0–34.0)
MCHC: 30 g/dL (ref 30.0–36.0)
MCV: 101.7 fL — ABNORMAL HIGH (ref 80.0–100.0)
Platelets: 180 10*3/uL (ref 150–400)
RBC: 4.13 MIL/uL (ref 3.87–5.11)
RDW: 16 % — ABNORMAL HIGH (ref 11.5–15.5)
WBC: 6.2 10*3/uL (ref 4.0–10.5)
nRBC: 0 % (ref 0.0–0.2)

## 2019-05-19 LAB — MAGNESIUM: Magnesium: 2.7 mg/dL — ABNORMAL HIGH (ref 1.7–2.4)

## 2019-05-19 MED ORDER — POTASSIUM CHLORIDE 10 MEQ/100ML IV SOLN
10.0000 meq | INTRAVENOUS | Status: AC
  Administered 2019-05-19 (×4): 10 meq via INTRAVENOUS
  Filled 2019-05-19 (×4): qty 100

## 2019-05-19 MED ORDER — ORAL CARE MOUTH RINSE
15.0000 mL | Freq: Two times a day (BID) | OROMUCOSAL | Status: DC
  Administered 2019-05-19 – 2019-05-23 (×5): 15 mL via OROMUCOSAL

## 2019-05-19 MED ORDER — DEXTROSE 5 % IV SOLN: INTRAVENOUS | Status: AC

## 2019-05-19 MED ORDER — ENSURE ENLIVE PO LIQD
237.0000 mL | Freq: Two times a day (BID) | ORAL | Status: DC
  Administered 2019-05-22 – 2019-05-23 (×3): 237 mL via ORAL

## 2019-05-19 MED ORDER — HALOPERIDOL LACTATE 5 MG/ML IJ SOLN
2.0000 mg | Freq: Four times a day (QID) | INTRAMUSCULAR | Status: DC | PRN
  Administered 2019-05-19 (×2): 2 mg via INTRAVENOUS
  Filled 2019-05-19 (×2): qty 1

## 2019-05-19 NOTE — Progress Notes (Signed)
Initial Nutrition Assessment  DOCUMENTATION CODES:   Severe malnutrition in context of acute illness/injury  INTERVENTION:  Ensure Enlive po BID, each supplement provides 350 kcal and 20 grams of protein    NUTRITION DIAGNOSIS:   Severe Malnutrition related to acute illness(UTI, dehydration) as evidenced by energy intake < or equal to 50% for > or equal to 5 days, percent weight loss.  GOAL:  Patient will meet greater than or equal to 90% of their needs   MONITOR:  PO intake, Diet advancement, Weight trends, Labs, I & O's, Supplement acceptance    REASON FOR ASSESSMENT:   Malnutrition Screening Tool    ASSESSMENT:  Patient is a 59 yo female with vascular dementia. She presents dehydrated and reported poor intake the past 5 days. UTI and weakness. Hx of diabetes (well-controlled A1C 5.5%).    Husband at bedside-provided history. Patient not responding verbally during time of RD visit.   Meal intake at lunch ~50% consumed. 100% of beverage. Significant fluid deficit on admission. Patient husband says "I just could not get her to eat or drink".   Weight history shows loss of 10% in < 2 months. Acute loss the past 5 days- minimal intake and chronic downward trend compared to June 2018- wt was 83 kg.  Labs reviewed: Sodium 166, Potassium 3.3, Glucose 165. A1C-5.5%.  Medications reviewed and include: Lipitor, Insulin  I/O- +3.33 liters UOP: unknown  NUTRITION - FOCUSED PHYSICAL EXAM: Nutrition-Focused physical exam findings are no fat depletion, mild temporal, clavicle muscle depletion, and no edema.    Diet Order:   Diet Order            Diet heart healthy/carb modified Room service appropriate? Yes; Fluid consistency: Thin  Diet effective now             EDUCATION NEEDS:  Education needs have been addressed Skin:  Skin Assessment: Reviewed RN Assessment(dry, flaky)  Last BM:  unknown  Height:   Ht Readings from Last 1 Encounters:  05/18/19 5\' 7"  (1.702 m)     Weight:   Wt Readings from Last 1 Encounters:  05/18/19 58.2 kg    Ideal Body Weight:   61 kg  BMI:  Body mass index is 20.1 kg/m.  Estimated Nutritional Needs:   Kcal:  1740-1856  Protein:  75-80 gr  Fluid:  >1700 ml daily   05/20/19 MS,RD,CSG,LDN Office: [phone] Pager: [phone]

## 2019-05-19 NOTE — Plan of Care (Signed)
  Problem: Acute Rehab PT Goals(only PT should resolve) Goal: Pt Will Go Supine/Side To Sit Outcome: Progressing Flowsheets (Taken 05/19/2019 1057) Pt will go Supine/Side to Sit: with moderate assist Goal: Pt Will Go Sit To Supine/Side Outcome: Progressing Flowsheets (Taken 05/19/2019 1057) Pt will go Sit to Supine/Side: with moderate assist Goal: Patient Will Perform Sitting Balance Outcome: Progressing Flowsheets (Taken 05/19/2019 1057) Patient will perform sitting balance: with moderate assist Goal: Patient Will Transfer Sit To/From Stand Outcome: Progressing Flowsheets (Taken 05/19/2019 1057) Patient will transfer sit to/from stand: with moderate assist Goal: Pt Will Transfer Bed To Chair/Chair To Bed Outcome: Progressing Flowsheets (Taken 05/19/2019 1057) Pt will Transfer Bed to Chair/Chair to Bed: with mod assist Goal: Pt Will Ambulate Outcome: Progressing Flowsheets (Taken 05/19/2019 1057) Pt will Ambulate:  15 feet  with least restrictive assistive device  with moderate assist   10:58 AM, 05/19/19 Wyman Songster PT, DPT Physical Therapist at [facility]

## 2019-05-19 NOTE — Progress Notes (Addendum)
CRITICAL VALUE ALERT  Critical Value:  Na 166, Potassium 2.7, Chloride 131  Date & Time Notied:  05/19/19 0127  Provider Notified: [employer]  Orders Received/Actions taken: See [facility]

## 2019-05-19 NOTE — Progress Notes (Signed)
CRITICAL VALUE ALERT  Critical Value:  Na 166, Chloride >130  Date & Time Notied:  05/19/19 0557  Provider Notified: Opyd  Orders Received/Actions taken:

## 2019-05-19 NOTE — Progress Notes (Signed)
PROGRESS NOTE    ALLE DIFABIO  SWF:[id number] DOB: 1960-06-18 DOA: 05/18/2019 PCP: Perlie Mayo, NP   Brief Narrative:  Per HPI: Monica Ayers is a 59 y.o. female with medical history significant for  Vascular dementia, hyper thyroidism, diabetes mellitus, hypertension.  Patient at baseline is nonverbal, also does not appear to understand spoken words.  History is obtained from chart review.  It was sent to the ED reports that over the past 5 days, patient has not been eating or drinking, also she has been weak and unable to stand.  Spouse also reported a strong odor to patient's urine and urine appearing dark red.  Patient spouse is unable to care for patient at home due to progressive worsening of her condition.  No reports of fever or cough.  I spoke to patient spouse, he confirmed history.  He reports at baseline patient ambulates by herself, but he sometimes supports her, though they do have a wheelchair. Also she understands what she has been told even though she is not able to talk.  1/15: Patient was admitted with what appears to be symptomatic hypernatremia from lack of oral intake along with some mild hypokalemia and UTI.  She has been started on half-normal saline with some potassium supplementation and continues to have elevated sodium levels with repeat studies pending.  She has been started empirically on IV Rocephin with urine cultures pending as well.  Assessment & Plan:   Active Problems:   Hypernatremia   Hypernatremia-persistent -Likely related to poor oral intake with free water deficit of 2.6 L noted -Continue half-normal saline and monitor repeat labs -Adjust to D5 water as needed  Hypokalemia -Continue repletion with IV fluid -Check repeat labs along with magnesium in a.m.  Generalized weakness with UTI -Continue IV Rocephin and monitor urine cultures -PT evaluation with recommendation for SNF  Hypertension with improved blood pressure readings  -Resume home Norvasc, lisinopril, propranolol  Diabetes -Stable blood glucose readings -Continue on sliding scale insulin -Hemoglobin A1c 5.5% -Continue to hold home Metformin  Vascular dementia with nonverbal status -Resume home aspirin and statins  DVT prophylaxis: Lovenox Code Status: Full Family Communication: Plan to call spouse Disposition Plan: Continue current IV fluids and monitor repeat labs and adjust as needed.  Monitor urine cultures.  PT evaluation with recommendations for SNF on discharge.   Consultants:   None  Procedures:   None  Antimicrobials:  Anti-infectives (From admission, onward)   Start     Dose/Rate Route Frequency Ordered Stop   05/19/19 2000  cefTRIAXone (ROCEPHIN) 1 g in sodium chloride 0.9 % 100 mL IVPB     1 g 200 mL/hr over 30 Minutes Intravenous Every 24 hours 05/18/19 2352     05/18/19 1815  cefTRIAXone (ROCEPHIN) 1 g in sodium chloride 0.9 % 100 mL IVPB     1 g 200 mL/hr over 30 Minutes Intravenous  Once 05/18/19 1801 05/18/19 2019       Subjective: Patient seen and evaluated today and appears to be in stable condition with no acute events overnight.  She is otherwise nonverbal.  Objective: Vitals:   05/18/19 2200 05/18/19 2352 05/19/19 0556 05/19/19 0752  BP:  (!) 155/100 122/73   Pulse: 76 80 81   Resp:  20 20   Temp:  (!) 97.5 F (36.4 C) 97.6 F (36.4 C)   TempSrc:  Axillary Oral   SpO2: 99% 100% 98% 97%  Weight:  58.2 kg    Height:  5'  7" (1.702 m)      Intake/Output Summary (Last 24 hours) at 05/19/2019 1253 Last data filed at 05/19/2019 0635 Gross per 24 hour  Intake 3558.96 ml  Output -  Net 3558.96 ml   Filed Weights   05/18/19 1300 05/18/19 2352  Weight: 65.1 kg 58.2 kg    Examination:  General exam: Appears calm and comfortable  Respiratory system: Clear to auscultation. Respiratory effort normal. Cardiovascular system: S1 & S2 heard, RRR. No JVD, murmurs, rubs, gallops or clicks. No pedal edema.  Gastrointestinal system: Abdomen is nondistended, soft and nontender. No organomegaly or masses felt. Normal bowel sounds heard. Central nervous system: Alert and awake.  Nonverbal. Extremities: No edema. Skin: No rashes, lesions or ulcers Psychiatry: Cannot be assessed.    Data Reviewed: I have personally reviewed following labs and imaging studies  CBC: Recent Labs  Lab 05/18/19 1447 05/19/19 0021  WBC 7.5 6.2  NEUTROABS 4.2  --   HGB 14.2 12.6  HCT 47.0* 42.0  MCV 100.9* 101.7*  PLT 245 180   Basic Metabolic Panel: Recent Labs  Lab 05/18/19 1447 05/19/19 0021 05/19/19 0511  NA 165* 166* 166*  K 3.1* 2.7* 3.3*  CL 128* 131* >130*  CO2 31 26 24   GLUCOSE 187* 183* 165*  BUN 34* 31* 27*  CREATININE 0.81 0.75 0.75  CALCIUM 9.0 8.6* 8.2*  MG  --  2.7*  --    GFR: Estimated Creatinine Clearance: 70.4 mL/min (by C-G formula based on SCr of 0.75 mg/dL). Liver Function Tests: Recent Labs  Lab 05/18/19 1447  AST 31  ALT 33  ALKPHOS 70  BILITOT 1.1  PROT 7.5  ALBUMIN 4.2   Recent Labs  Lab 05/18/19 1447  LIPASE 64*   No results for input(s): AMMONIA in the last 168 hours. Coagulation Profile: No results for input(s): INR, PROTIME in the last 168 hours. Cardiac Enzymes: No results for input(s): CKTOTAL, CKMB, CKMBINDEX, TROPONINI in the last 168 hours. BNP (last 3 results) No results for input(s): PROBNP in the last 8760 hours. HbA1C: Recent Labs    05/19/19 0021  HGBA1C 5.5   CBG: Recent Labs  Lab 05/18/19 1347 05/19/19 0000 05/19/19 0745 05/19/19 1110  GLUCAP 163* 138* 131* 140*   Lipid Profile: No results for input(s): CHOL, HDL, LDLCALC, TRIG, CHOLHDL, LDLDIRECT in the last 72 hours. Thyroid Function Tests: Recent Labs    05/18/19 1448  TSH 0.022*   Anemia Panel: No results for input(s): VITAMINB12, FOLATE, FERRITIN, TIBC, IRON, RETICCTPCT in the last 72 hours. Sepsis Labs: No results for input(s): PROCALCITON, LATICACIDVEN in the  last 168 hours.  Recent Results (from the past 240 hour(s))  Respiratory Panel by RT PCR (Flu A&B, Covid) - Nasopharyngeal Swab     Status: None   Collection Time: 05/18/19  5:27 PM   Specimen: Nasopharyngeal Swab  Result Value Ref Range Status   SARS Coronavirus 2 by RT PCR NEGATIVE NEGATIVE Final    Comment: (NOTE) SARS-CoV-2 target nucleic acids are NOT DETECTED. The SARS-CoV-2 RNA is generally detectable in upper respiratoy specimens during the acute phase of infection. The lowest concentration of SARS-CoV-2 viral copies this assay can detect is 131 copies/mL. A negative result does not preclude SARS-Cov-2 infection and should not be used as the sole basis for treatment or other patient management decisions. A negative result may occur with  improper specimen collection/handling, submission of specimen other than nasopharyngeal swab, presence of viral mutation(s) within the areas targeted by this assay, and  inadequate number of viral copies (<131 copies/mL). A negative result must be combined with clinical observations, patient history, and epidemiological information. The expected result is Negative. Fact Sheet for Patients:  https://www.moore.com/ Fact Sheet for Healthcare Providers:  https://www.young.biz/ This test is not yet ap proved or cleared by the [place] FDA and  has been authorized for detection and/or diagnosis of SARS-CoV-2 by FDA under an Emergency Use Authorization (EUA). This EUA will remain  in effect (meaning this test can be used) for the duration of the COVID-19 declaration under Section 564(b)(1) of the Act, 21 U.S.C. section 360bbb-3(b)(1), unless the authorization is terminated or revoked sooner.    Influenza A by PCR NEGATIVE NEGATIVE Final   Influenza B by PCR NEGATIVE NEGATIVE Final    Comment: (NOTE) The Xpert Xpress SARS-CoV-2/FLU/RSV assay is intended as an aid in  the diagnosis of influenza from  Nasopharyngeal swab specimens and  should not be used as a sole basis for treatment. Nasal washings and  aspirates are unacceptable for Xpert Xpress SARS-CoV-2/FLU/RSV  testing. Fact Sheet for Patients: https://www.moore.com/ Fact Sheet for Healthcare Providers: https://www.young.biz/ This test is not yet approved or cleared by the [place] FDA and  has been authorized for detection and/or diagnosis of SARS-CoV-2 by  FDA under an Emergency Use Authorization (EUA). This EUA will remain  in effect (meaning this test can be used) for the duration of the  Covid-19 declaration under Section 564(b)(1) of the Act, 21  U.S.C. section 360bbb-3(b)(1), unless the authorization is  terminated or revoked. Performed at [facility], [address], [place], [place] [address]          Radiology Studies: DG Chest Portable 1 View  Result Date: 05/18/2019 CLINICAL DATA:  Generalized weakness and dehydration EXAM: PORTABLE CHEST 1 VIEW COMPARISON:  December 28, 2018. FINDINGS: Lungs are clear. Heart size and pulmonary vascularity are normal. No adenopathy. There is postoperative change in the lower cervical region. IMPRESSION: Lungs clear.  Cardiac silhouette normal.  No adenopathy. Electronically Signed   By: Bretta Bang III M.D.   On: 05/18/2019 14:34        Scheduled Meds: . aspirin  325 mg Oral Q breakfast  . atorvastatin  40 mg Oral q1800  . enoxaparin (LOVENOX) injection  40 mg Subcutaneous Q24H  . feeding supplement (ENSURE ENLIVE)  237 mL Oral BID BM  . insulin aspart  0-5 Units Subcutaneous QHS  . insulin aspart  0-9 Units Subcutaneous TID WC  . mouth rinse  15 mL Mouth Rinse BID  . potassium chloride SA  40 mEq Oral Once   Continuous Infusions: . cefTRIAXone (ROCEPHIN)  IV       LOS: 1 day    Time spent: 30 minutes    Ruthia Person Hoover Brunette, DO [facility] Pager [phone]  If 7PM-7AM, please contact night-coverage  [email or web address] Password [facility] 05/19/2019, 12:53 PM

## 2019-05-19 NOTE — Plan of Care (Signed)
  Problem: Education: Goal: Knowledge of General Education information will improve Description Including pain rating scale, medication(s)/side effects and non-pharmacologic comfort measures Outcome: Progressing   Problem: Health Behavior/Discharge Planning: Goal: Ability to manage health-related needs will improve Outcome: Progressing   

## 2019-05-19 NOTE — Care Management Important Message (Signed)
Important Message  Patient Details  Name: Monica Ayers MRN: [id number] Date of Birth: 1960/12/20   Medicare Important Message Given:  Yes     Corey Harold 05/19/2019, 3:06 PM

## 2019-05-19 NOTE — Progress Notes (Signed)
Pt new admission to Unit 300. Patient combative while obtaining vital signs and initial assessment. MD notified. New orders placed and followed. Hand mittens on and lights turned down for patient. Bed alarm on and fall matts in place.

## 2019-05-19 NOTE — TOC Initial Note (Signed)
Transition of Care [facility]) - Initial/Assessment Note    Patient Details  Name: Monica Ayers MRN: [id number] Date of Birth: [birth year]  [facility] CM/SW Contact:    Leitha Bleak, RN Phone Number: 05/19/2019, 2:30 PM  Clinical Narrative:   Patient admitted with hypernatremia, weak. High risk for readmission. Patient is non-verbal, lives at home with her husband. CM called husband with PT recommendation for SNF.  Fayrene Fearing states he will take care of her a home.  They used [facility] in the past and she had been doing well , walking with roller walker. He has a Zenaida Niece and takes her out using a wheelchair.  TOC to follow for any needs at discharge.                Expected Discharge Plan: Home w Home Health Services Barriers to Discharge: Continued Medical Work up   Patient Goals and CMS Choice Patient states their goals for this hospitalization and ongoing recovery are:: husband wants her home.      Expected Discharge Plan and Services Expected Discharge Plan: Home w Home Health Services     Prior Living Arrangements/Services   Lives with:: Spouse   Do you feel safe going back to the place where you live?: Yes          Activities of Daily Living Home Assistive Devices/Equipment: Eyeglasses ADL Screening (condition at time of admission) Patient's cognitive ability adequate to safely complete daily activities?: No Is the patient deaf or have difficulty hearing?: No Does the patient have difficulty seeing, even when wearing glasses/contacts?: No Does the patient have difficulty concentrating, remembering, or making decisions?: Yes Patient able to express need for assistance with ADLs?: Yes Does the patient have difficulty dressing or bathing?: Yes Independently performs ADLs?: No Communication: Independent(pt able to state few words) Dressing (OT): Needs assistance Is this a change from baseline?: Pre-admission baseline Grooming: Needs assistance Is this  a change from baseline?: Pre-admission baseline Feeding: Needs assistance Is this a change from baseline?: Pre-admission baseline Bathing: Needs assistance Is this a change from baseline?: Pre-admission baseline Toileting: Needs assistance Is this a change from baseline?: Pre-admission baseline In/Out Bed: Independent Walks in Home: Independent(per husband pt does not use assistive device) Does the patient have difficulty walking or climbing stairs?: Yes Weakness of Legs: Both Weakness of Arms/Hands: None  Permission Sought/Granted      Share Information with NAME: Fayrene Fearing     Permission granted to share info w Relationship: husband     Emotional Assessment      Alcohol / Substance Use: Not Applicable Psych Involvement: No (comment)  Admission diagnosis:  Dehydration [E86.0] Hyperchloremia [E87.8] Lower urinary tract infectious disease [N39.0] Delirium [R41.0] Hypernatremia [E87.0] Patient Active Problem List   Diagnosis Date Noted  . Hypernatremia 05/18/2019  . Hyperthyroidism 01/18/2019  . Sepsis (HCC) 12/28/2018  . Hypokalemia   . Unsteady gait 12/13/2018  . Falls frequently 12/13/2018  . Pressure injury of skin 11/18/2018  . Transaminasemia 11/18/2018  . Sepsis due to undetermined organism (HCC) 11/17/2018  . Lactic acidosis 11/17/2018  . Type 2 diabetes mellitus without complication, without long-term current use of insulin (HCC) 11/17/2018  . 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 Essential hypertension 10/02/2016  . Allergic state 10/02/2016   PCP:  Freddy Finner, NP Pharmacy:   [facility] [place], [place] - [address] [place] [address] Phone: [phone] Fax: [phone]   Readmission Risk Interventions  Readmission Risk Prevention Plan 05/19/2019 11/20/2018  Post Dischage Appt - Complete  Medication Screening - Complete  Transportation Screening Complete Complete  PCP or Specialist Appt within 3-5 Days Not  Complete -  HRI or Home Care Consult Complete -  Social Work Consult for Recovery Care Planning/Counseling Complete -  Palliative Care Screening Not Complete -  Medication Review Press photographer) Complete -  Some recent data might be hidden

## 2019-05-19 NOTE — Evaluation (Signed)
Physical Therapy Evaluation Patient Details Name: Monica Ayers MRN: [id number] DOB: [birth year] Today's Date: 05/19/2019   History of Present Illness  Monica Ayers is a 59 y.o. female with medical history significant for  Vascular dementia, hyper thyroidism, diabetes mellitus, hypertension.  Patient at baseline is nonverbal, also does not appear to understand spoken words.  History is obtained from chart review.  It was sent to the ED reports that over the past 5 days, patient has not been eating or drinking, also she has been weak and unable to stand.  Spouse also reported a strong odor to patient's urine and urine appearing dark red.  Patient spouse is unable to care for patient at home due to progressive worsening of her condition.  No reports of fever or cough.    Clinical Impression  Patient limited secondary to cognitive impairments. She was able to perform several ankle pumps when asked today but shook her head "no" when asked if she could move her legs and transition to seated EOB. She was able to raise her arms several times without cueing to grasp other hand. Difficult to determine overall level of function due to patient's level of cognition. Patient overall appears to be generally weak. Patient will benefit from continued physical therapy in hospital and recommended venue below to increase strength, balance, endurance for safe ADLs and gait.     Follow Up Recommendations SNF    Equipment Recommendations  None recommended by PT    Recommendations for Other Services       Precautions / Restrictions Precautions Precautions: Fall Restrictions Weight Bearing Restrictions: No      Mobility  Bed Mobility Overal bed mobility: Needs Assistance             General bed mobility comments: patient able to perform ankle pumps when asked, when asked if she could move her legs she shakes her head "no", when asked if she could transition to sitting EOB she shakes her head  "no", does not respond to any other commands given  Transfers                    Ambulation/Gait                Stairs            Wheelchair Mobility    Modified Rankin (Stroke Patients Only)       Balance                                             Pertinent Vitals/Pain Pain Assessment: No/denies pain    Home Living Family/patient expects to be discharged to:: Private residence Living Arrangements: Spouse/significant other Available Help at Discharge: Family;Available 24 hours/day Type of Home: Mobile home Home Access: Stairs to enter Entrance Stairs-Rails: Right;Left;Can reach both Entrance Stairs-Number of Steps: 3-4 Home Layout: One level Home Equipment: Walker - 4 wheels;Bedside commode;Wheelchair - manual;Cane - single point Additional Comments: Home set up obtained from patient medical record    Prior Function Level of Independence: Needs assistance   Gait / Transfers Assistance Needed: per chart, patient able to ambulate with intermittent assist  ADL's / Homemaking Assistance Needed: assisted by spouse        Hand Dominance        Extremity/Trunk Assessment   Upper Extremity Assessment Upper Extremity Assessment: Difficult to assess  due to impaired cognition    Lower Extremity Assessment Lower Extremity Assessment: Difficult to assess due to impaired cognition       Communication   Communication: Other (comment);Receptive difficulties;Expressive difficulties(cognitive impairments limit communication)  Cognition Arousal/Alertness: Awake/alert Behavior During Therapy: Flat affect Overall Cognitive Status: No family/caregiver present to determine baseline cognitive functioning                                 General Comments: Patient non verbal, does not respond to commands      General Comments      Exercises     Assessment/Plan    PT Assessment Patient needs continued PT services   PT Problem List Decreased strength;Decreased activity tolerance;Decreased balance;Decreased mobility;Decreased cognition       PT Treatment Interventions DME instruction;Gait training;Functional mobility training;Therapeutic activities;Therapeutic exercise;Balance training;Neuromuscular re-education;Patient/family education    PT Goals (Current goals can be found in the Care Plan section)  Acute Rehab PT Goals Patient Stated Goal: return home PT Goal Formulation: With patient Time For Goal Achievement: 06/02/19 Potential to Achieve Goals: Fair    Frequency Min 3X/week   Barriers to discharge        Co-evaluation               AM-PAC PT "6 Clicks" Mobility  Outcome Measure Help needed turning from your back to your side while in a flat bed without using bedrails?: A Lot Help needed moving from lying on your back to sitting on the side of a flat bed without using bedrails?: A Lot Help needed moving to and from a bed to a chair (including a wheelchair)?: A Lot Help needed standing up from a chair using your arms (e.g., wheelchair or bedside chair)?: A Lot Help needed to walk in hospital room?: A Lot Help needed climbing 3-5 steps with a railing? : A Lot 6 Click Score: 12    End of Session   Activity Tolerance: Patient tolerated treatment well Patient left: in bed;with call bell/phone within reach;with bed alarm set;with nursing/sitter in room Nurse Communication: Mobility status PT Visit Diagnosis: Unsteadiness on feet (R26.81);Other abnormalities of gait and mobility (R26.89);Muscle weakness (generalized) (M62.81)    Time: 0998-3382 PT Time Calculation (min) (ACUTE ONLY): 8 min   Charges:   PT Evaluation $PT Eval Moderate Complexity: 1 Mod         10:56 AM, 05/19/19 Wyman Songster PT, DPT Physical Therapist at [facility]

## 2019-05-20 ENCOUNTER — Encounter (HOSPITAL_COMMUNITY): Payer: Self-pay

## 2019-05-20 LAB — GLUCOSE, CAPILLARY
Glucose-Capillary: 132 mg/dL — ABNORMAL HIGH (ref 70–99)
Glucose-Capillary: 104 mg/dL — ABNORMAL HIGH (ref 70–99)
Glucose-Capillary: 91 mg/dL (ref 70–99)
Glucose-Capillary: 94 mg/dL (ref 70–99)
Glucose-Capillary: 120 mg/dL — ABNORMAL HIGH (ref 70–99)

## 2019-05-20 LAB — URINALYSIS, ROUTINE W REFLEX MICROSCOPIC
Bilirubin Urine: NEGATIVE
Glucose, UA: 50 mg/dL — AB
Ketones, ur: NEGATIVE mg/dL
Leukocytes,Ua: NEGATIVE
Nitrite: NEGATIVE
Protein, ur: NEGATIVE mg/dL
RBC / HPF: >50 RBC/hpf — ABNORMAL HIGH (ref 0–5)
Specific Gravity, Urine: 1.023 (ref 1.005–1.030)
pH: 5 (ref 5.0–8.0)

## 2019-05-20 NOTE — Progress Notes (Signed)
PROGRESS NOTE    Monica Ayers  TIR:[id number] DOB: [birth year] DOA: 05/18/2019 PCP: Perlie Mayo, NP   Brief Narrative:  Per HPI: Monica Shambaugh Vernonis a 59 y.o.femalewith medical history significant forVascular dementia, hyper thyroidism, diabetes mellitus, hypertension. Patient at baseline is nonverbal, also does notappearto understand spoken words. History is obtained from chart review. It was sent to the ED reports that over the past 5 days, patient has not been eating or drinking, also she has been weak and unable to stand. Spouse also reported a strong odor to patient's urine and urine appearing dark red. Patient spouse is unable to care for patient at home due to progressive worsening of her condition. No reports of fever or cough. I spoke to patient spouse, he confirmed history. He reports at baseline patient ambulates by herself, but he sometimes supports her, thoughtheydohave a wheelchair. Also she understands what she has been told even though she is not able to talk.  1/15: Patient was admitted with what appears to be symptomatic hypernatremia from lack of oral intake along with some mild hypokalemia and UTI.  She has been started on half-normal saline with some potassium supplementation and continues to have elevated sodium levels with repeat studies pending.  She has been started empirically on IV Rocephin with urine cultures pending as well.  1/16: Patient appears to be doing much better today and was seen by her husband who agrees.  Discontinue IV Rocephin as urine cultures demonstrated no growth.  Laboratory data cannot be obtained as patient labs cannot be drawn.  We will plan to place midline today for better IV access and lab draws.  Assessment & Plan:   Active Problems:   Hypernatremia   Hypernatremia-persistent -Likely related to poor oral intake with free water deficit of 2.6 L noted -Likely improved at this point, but uncertain without  labs -Hold further IV fluid until labs are available after midline placement  Hypokalemia -Continue repletion with IV fluid -Check repeat labs along with magnesium in a.m.  Generalized weakness likely secondary to electrolyte disturbances above -Discontinue IV Rocephin -PT evaluation with recommendation for SNF, will discuss with family once daily SNF versus home health tomorrow  Hypertension with improved blood pressure readings -Continue home Norvasc, lisinopril, propranolol  Diabetes -Stable blood glucose readings -Continue on sliding scale insulin -Hemoglobin A1c 5.5% -Continue to hold home Metformin  Vascular dementia with nonverbal status -Resume home aspirin and statins  DVT prophylaxis: Lovenox Code Status: Full Family Communication: Plan to call spouse Disposition Plan:  Await further lab data to determine management.  Potential discharge to SNF versus home health PT.   Consultants:   None  Procedures:   None  Antimicrobials:  Anti-infectives (From admission, onward)   Start     Dose/Rate Route Frequency Ordered Stop   05/19/19 2000  cefTRIAXone (ROCEPHIN) 1 g in sodium chloride 0.9 % 100 mL IVPB  Status:  Discontinued     1 g 200 mL/hr over 30 Minutes Intravenous Every 24 hours 05/18/19 2352 05/20/19 0712   05/18/19 1815  cefTRIAXone (ROCEPHIN) 1 g in sodium chloride 0.9 % 100 mL IVPB     1 g 200 mL/hr over 30 Minutes Intravenous  Once 05/18/19 1801 05/18/19 2019       Subjective: Patient seen and evaluated today with no new acute complaints or concerns. No acute concerns or events noted overnight.  Appears to be more alert and awake.  Nonverbal at baseline.  Objective: Vitals:   05/19/19 1731 05/19/19  2051 05/20/19 0615 05/20/19 1319  BP: 121/72 99/62  132/77  Pulse: 75 63  68  Resp: 20 20  17   Temp: 98.4 F (36.9 C) 98 F (36.7 C)  (!) 97.3 F (36.3 C)  TempSrc: Oral Axillary    SpO2: 94% 99%  99%  Weight:   62 kg   Height:         Intake/Output Summary (Last 24 hours) at 05/20/2019 1339 Last data filed at 05/20/2019 1320 Gross per 24 hour  Intake 120 ml  Output 802 ml  Net -682 ml   Filed Weights   05/18/19 1300 05/18/19 2352 05/20/19 0615  Weight: 65.1 kg 58.2 kg 62 kg    Examination:  General exam: Appears calm and comfortable  Respiratory system: Clear to auscultation. Respiratory effort normal. Cardiovascular system: S1 & S2 heard, RRR. No JVD, murmurs, rubs, gallops or clicks. No pedal edema. Gastrointestinal system: Abdomen is nondistended, soft and nontender. No organomegaly or masses felt. Normal bowel sounds heard. Central nervous system: Alert and makes eye contact. Extremities: No edema Skin: No rashes, lesions or ulcers Psychiatry: Cannot be assessed given patient condition.    Data Reviewed: I have personally reviewed following labs and imaging studies  CBC: Recent Labs  Lab 05/18/19 1447 05/19/19 0021  WBC 7.5 6.2  NEUTROABS 4.2  --   HGB 14.2 12.6  HCT 47.0* 42.0  MCV 100.9* 101.7*  PLT 245 180   Basic Metabolic Panel: Recent Labs  Lab 05/18/19 1447 05/19/19 0021 05/19/19 0511  NA 165* 166* 166*  K 3.1* 2.7* 3.3*  CL 128* 131* >130*  CO2 31 26 24   GLUCOSE 187* 183* 165*  BUN 34* 31* 27*  CREATININE 0.81 0.75 0.75  CALCIUM 9.0 8.6* 8.2*  MG  --  2.7*  --    GFR: Estimated Creatinine Clearance: 74.5 mL/min (by C-G formula based on SCr of 0.75 mg/dL). Liver Function Tests: Recent Labs  Lab 05/18/19 1447  AST 31  ALT 33  ALKPHOS 70  BILITOT 1.1  PROT 7.5  ALBUMIN 4.2   Recent Labs  Lab 05/18/19 1447  LIPASE 64*   No results for input(s): AMMONIA in the last 168 hours. Coagulation Profile: No results for input(s): INR, PROTIME in the last 168 hours. Cardiac Enzymes: No results for input(s): CKTOTAL, CKMB, CKMBINDEX, TROPONINI in the last 168 hours. BNP (last 3 results) No results for input(s): PROBNP in the last 8760 hours. HbA1C: Recent Labs     05/19/19 0021  HGBA1C 5.5   CBG: Recent Labs  Lab 05/19/19 1110 05/19/19 1652 05/19/19 2205 05/20/19 0726 05/20/19 1108  GLUCAP 140* 148* 241* 132* 104*   Lipid Profile: No results for input(s): CHOL, HDL, LDLCALC, TRIG, CHOLHDL, LDLDIRECT in the last 72 hours. Thyroid Function Tests: Recent Labs    05/18/19 1448  TSH 0.022*   Anemia Panel: No results for input(s): VITAMINB12, FOLATE, FERRITIN, TIBC, IRON, RETICCTPCT in the last 72 hours. Sepsis Labs: No results for input(s): PROCALCITON, LATICACIDVEN in the last 168 hours.  Recent Results (from the past 240 hour(s))  Urine culture     Status: None   Collection Time: 05/18/19  5:20 PM   Specimen: Urine, Random  Result Value Ref Range Status   Specimen Description   Final    URINE, RANDOM Performed at [facility], [address] [place], [address] [place]    Special Requests   Final    NONE Performed at [facility], [address] [place], [address]  [address]    Culture   Final    NO GROWTH Performed at [facility], [address], [place], [place] [address]    Report Status 05/19/2019 FINAL  Final  Respiratory Panel by RT PCR (Flu A&B, Covid) - Nasopharyngeal Swab     Status: None   Collection Time: 05/18/19  5:27 PM   Specimen: Nasopharyngeal Swab  Result Value Ref Range Status   SARS Coronavirus 2 by RT PCR NEGATIVE NEGATIVE Final    Comment: (NOTE) SARS-CoV-2 target nucleic acids are NOT DETECTED. The SARS-CoV-2 RNA is generally detectable in upper respiratoy specimens during the acute phase of infection. The lowest concentration of SARS-CoV-2 viral copies this assay can detect is 131 copies/mL. A negative result does not preclude SARS-Cov-2 infection and should not be used as the sole basis for treatment or other patient management decisions. A negative result may occur with  improper specimen collection/handling, submission of specimen other than nasopharyngeal swab, presence of viral mutation(s)  within the areas targeted by this assay, and inadequate number of viral copies (<131 copies/mL). A negative result must be combined with clinical observations, patient history, and epidemiological information. The expected result is Negative. Fact Sheet for Patients:  https://www.moore.com/ Fact Sheet for Healthcare Providers:  https://www.young.biz/ This test is not yet ap proved or cleared by the [place] FDA and  has been authorized for detection and/or diagnosis of SARS-CoV-2 by FDA under an Emergency Use Authorization (EUA). This EUA will remain  in effect (meaning this test can be used) for the duration of the COVID-19 declaration under Section 564(b)(1) of the Act, 21 U.S.C. section 360bbb-3(b)(1), unless the authorization is terminated or revoked sooner.    Influenza A by PCR NEGATIVE NEGATIVE Final   Influenza B by PCR NEGATIVE NEGATIVE Final    Comment: (NOTE) The Xpert Xpress SARS-CoV-2/FLU/RSV assay is intended as an aid in  the diagnosis of influenza from Nasopharyngeal swab specimens and  should not be used as a sole basis for treatment. Nasal washings and  aspirates are unacceptable for Xpert Xpress SARS-CoV-2/FLU/RSV  testing. Fact Sheet for Patients: https://www.moore.com/ Fact Sheet for Healthcare Providers: https://www.young.biz/ This test is not yet approved or cleared by the [place] FDA and  has been authorized for detection and/or diagnosis of SARS-CoV-2 by  FDA under an Emergency Use Authorization (EUA). This EUA will remain  in effect (meaning this test can be used) for the duration of the  Covid-19 declaration under Section 564(b)(1) of the Act, 21  U.S.C. section 360bbb-3(b)(1), unless the authorization is  terminated or revoked. Performed at [facility], [address], [place], [place] [address]          Radiology Studies: DG Chest Portable 1 View  Result  Date: 05/18/2019 CLINICAL DATA:  Generalized weakness and dehydration EXAM: PORTABLE CHEST 1 VIEW COMPARISON:  December 28, 2018. FINDINGS: Lungs are clear. Heart size and pulmonary vascularity are normal. No adenopathy. There is postoperative change in the lower cervical region. IMPRESSION: Lungs clear.  Cardiac silhouette normal.  No adenopathy. Electronically Signed   By: Bretta Bang III M.D.   On: 05/18/2019 14:34        Scheduled Meds: . aspirin  325 mg Oral Q breakfast  . atorvastatin  40 mg Oral q1800  . enoxaparin (LOVENOX) injection  40 mg Subcutaneous Q24H  . feeding supplement (ENSURE ENLIVE)  237 mL Oral BID BM  . insulin aspart  0-5 Units Subcutaneous QHS  . insulin aspart  0-9  Units Subcutaneous TID WC  . mouth rinse  15 mL Mouth Rinse BID  . potassium chloride SA  40 mEq Oral Once   Continuous Infusions:   LOS: 2 days    Time spent: 30 minutes    Khori Rosevear Hoover Brunette, DO [facility] Pager [phone]  If 7PM-7AM, please contact night-coverage [email or web address] Password [email or web address] 05/20/2019, 1:39 PM

## 2019-05-20 NOTE — Progress Notes (Signed)
Paged MD due to lab not being able to draw blood work due to poor vascular. MD placed order for a midline to be inserted. Called vascular wellness. Awaiting for ETA to place midline.

## 2019-05-20 NOTE — Progress Notes (Signed)
No output entire shift. Bladder scan shows greater than 567. Dr Selena Batten gave orders to in and out cath.

## 2019-05-20 NOTE — Plan of Care (Signed)

## 2019-05-20 NOTE — Progress Notes (Signed)
In and out cath; removed 800 mL tea colored urine. Sent sample down to lab.

## 2019-05-21 LAB — BASIC METABOLIC PANEL
Anion gap: 8 (ref 5–15)
BUN: 7 mg/dL (ref 6–20)
CO2: 29 mmol/L (ref 22–32)
Calcium: 8.2 mg/dL — ABNORMAL LOW (ref 8.9–10.3)
Chloride: 118 mmol/L — ABNORMAL HIGH (ref 98–111)
Creatinine, Ser: 0.61 mg/dL (ref 0.44–1.00)
GFR calc Af Amer: >60 mL/min (ref >60)
GFR calc non Af Amer: >60 mL/min (ref >60)
Glucose, Bld: 140 mg/dL — ABNORMAL HIGH (ref 70–99)
Potassium: 2.7 mmol/L — CL (ref 3.5–5.1)
Sodium: 155 mmol/L — ABNORMAL HIGH (ref 135–145)

## 2019-05-21 LAB — GLUCOSE, CAPILLARY
Glucose-Capillary: 125 mg/dL — ABNORMAL HIGH (ref 70–99)
Glucose-Capillary: 152 mg/dL — ABNORMAL HIGH (ref 70–99)
Glucose-Capillary: 122 mg/dL — ABNORMAL HIGH (ref 70–99)
Glucose-Capillary: 103 mg/dL — ABNORMAL HIGH (ref 70–99)

## 2019-05-21 LAB — CBC
HCT: 31.2 % — ABNORMAL LOW (ref 36.0–46.0)
Hemoglobin: 10.3 g/dL — ABNORMAL LOW (ref 12.0–15.0)
MCH: 30.5 pg (ref 26.0–34.0)
MCHC: 33 g/dL (ref 30.0–36.0)
MCV: 92.3 fL (ref 80.0–100.0)
Platelets: 148 10*3/uL — ABNORMAL LOW (ref 150–400)
RBC: 3.38 MIL/uL — ABNORMAL LOW (ref 3.87–5.11)
RDW: 15.1 % (ref 11.5–15.5)
WBC: 5 10*3/uL (ref 4.0–10.5)
nRBC: 0 % (ref 0.0–0.2)

## 2019-05-21 MED ORDER — SODIUM CHLORIDE 0.45 % IV SOLN: INTRAVENOUS | Status: DC

## 2019-05-21 MED ORDER — POTASSIUM CHLORIDE 10 MEQ/100ML IV SOLN
10.0000 meq | INTRAVENOUS | Status: AC
  Administered 2019-05-21 (×6): 10 meq via INTRAVENOUS
  Filled 2019-05-21 (×5): qty 100

## 2019-05-21 NOTE — Progress Notes (Signed)
PROGRESS NOTE    Monica Ayers  WUJ:[id number] DOB: 12/28/1960 DOA: 05/18/2019 PCP: Freddy Finner, NP   Brief Narrative:  Per HPI: Monica Collymore Vernonis a 59 y.o.femalewith medical history significant forVascular dementia, hyper thyroidism, diabetes mellitus, hypertension. Patient at baseline is nonverbal, also does notappearto understand spoken words. History is obtained from chart review. It was sent to the ED reports that over the past 5 days, patient has not been eating or drinking, also she has been weak and unable to stand. Spouse also reported a strong odor to patient's urine and urine appearing dark red. Patient spouse is unable to care for patient at home due to progressive worsening of her condition. No reports of fever or cough. I spoke to patient spouse, he confirmed history. He reports at baseline patient ambulates by herself, but he sometimes supports her, thoughtheydohave a wheelchair. Also she understands what she has been told even though she is not able to talk.  1/15:Patient was admitted with what appears to be symptomatic hypernatremia from lack of oral intake along with some mild hypokalemia and UTI. She has been started on half-normal saline with some potassium supplementation and continues to have elevated sodium levels with repeat studies pending. She has been started empirically on IV Rocephin with urine cultures pending as well.  1/16: Patient appears to be doing much better today and was seen by her husband who agrees.  Discontinue IV Rocephin as urine cultures demonstrated no growth.  Laboratory data cannot be obtained as patient labs cannot be drawn.  We will plan to place midline today for better IV access and lab draws.  1/17: Patient continues to do well and repeat labs demonstrate persistent hypernatremia, that is improving as well as some severe hypokalemia that will need IV supplementation.  Plan to discharge to home with home health PT  once stable.  Assessment & Plan:   Active Problems:   Hypernatremia   Hypernatremia-persistent, but improving -Likely related to poor oral intake with free water deficit of 2.6 L noted -Labs could not be obtained over the last 24-48 hours and patient has received midline to assist -Repeat demonstrates improving sodium levels, but she still requires hypotonic solution and will continue monitoring in a.m.  Hypokalemia -Continue repletion with IV fluid -Check repeat labs along with magnesium in a.m.  Generalized weakness likely secondary to electrolyte disturbances above -Discontinue IV Rocephin -PT evaluation with recommendation for SNF, will discuss with family once daily SNF versus home health tomorrow  Hypertension with improved blood pressure readings -Continue home Norvasc, lisinopril, propranolol  Diabetes -Stable blood glucose readings -Continue on sliding scale insulin -Hemoglobin A1c 5.5% -Continue to hold home Metformin  Vascular dementia with nonverbal status -Resume home aspirin and statins  DVT prophylaxis:Lovenox Code Status:Full Family Communication:Discussed with husband 1/17 Disposition Plan: Await further lab data to determine management.    Discharge to home health PT when able as family declines SNF   Consultants:  None  Procedures:  None  Antimicrobials:  Anti-infectives (From admission, onward)   Start     Dose/Rate Route Frequency Ordered Stop   05/19/19 2000  cefTRIAXone (ROCEPHIN) 1 g in sodium chloride 0.9 % 100 mL IVPB  Status:  Discontinued     1 g 200 mL/hr over 30 Minutes Intravenous Every 24 hours 05/18/19 2352 05/20/19 0712   05/18/19 1815  cefTRIAXone (ROCEPHIN) 1 g in sodium chloride 0.9 % 100 mL IVPB     1 g 200 mL/hr over 30 Minutes Intravenous  Once 05/18/19 1801 05/18/19 2019       Subjective: Patient seen and evaluated today with no new acute complaints or concerns. No acute concerns or events noted  overnight.  She is nonverbal.  Objective: Vitals:   05/20/19 0615 05/20/19 1319 05/20/19 2011 05/20/19 2117  BP:  132/77  (!) 141/84  Pulse:  68  85  Resp:  17  19  Temp:  (!) 97.3 F (36.3 C)  98.6 F (37 C)  TempSrc:    Oral  SpO2:  99% 99% 100%  Weight: 62 kg     Height:        Intake/Output Summary (Last 24 hours) at 05/21/2019 1015 Last data filed at 05/21/2019 0941 Gross per 24 hour  Intake 360 ml  Output 5 ml  Net 355 ml   Filed Weights   05/18/19 1300 05/18/19 2352 05/20/19 0615  Weight: 65.1 kg 58.2 kg 62 kg    Examination:  General exam: Appears calm and comfortable  Respiratory system: Clear to auscultation. Respiratory effort normal. Cardiovascular system: S1 & S2 heard, RRR. No JVD, murmurs, rubs, gallops or clicks. No pedal edema. Gastrointestinal system: Abdomen is nondistended, soft and nontender. No organomegaly or masses felt. Normal bowel sounds heard. Central nervous system: Alert and awake. Extremities: No edema Skin: No rashes, lesions or ulcers Psychiatry: Difficult to assess.    Data Reviewed: I have personally reviewed following labs and imaging studies  CBC: Recent Labs  Lab 05/18/19 1447 05/19/19 0021 05/21/19 0834  WBC 7.5 6.2 5.0  NEUTROABS 4.2  --   --   HGB 14.2 12.6 10.3*  HCT 47.0* 42.0 31.2*  MCV 100.9* 101.7* 92.3  PLT 245 180 174*   Basic Metabolic Panel: Recent Labs  Lab 05/18/19 1447 05/19/19 0021 05/19/19 0511 05/21/19 0834  NA 165* 166* 166* 155*  K 3.1* 2.7* 3.3* 2.7*  CL 128* 131* >130* 118*  CO2 31 26 24 29   GLUCOSE 187* 183* 165* 140*  BUN 34* 31* 27* 7  CREATININE 0.81 0.75 0.75 0.61  CALCIUM 9.0 8.6* 8.2* 8.2*  MG  --  2.7*  --   --    GFR: Estimated Creatinine Clearance: 74.5 mL/min (by C-G formula based on SCr of 0.61 mg/dL). Liver Function Tests: Recent Labs  Lab 05/18/19 1447  AST 31  ALT 33  ALKPHOS 70  BILITOT 1.1  PROT 7.5  ALBUMIN 4.2   Recent Labs  Lab 05/18/19 1447  LIPASE  64*   No results for input(s): AMMONIA in the last 168 hours. Coagulation Profile: No results for input(s): INR, PROTIME in the last 168 hours. Cardiac Enzymes: No results for input(s): CKTOTAL, CKMB, CKMBINDEX, TROPONINI in the last 168 hours. BNP (last 3 results) No results for input(s): PROBNP in the last 8760 hours. HbA1C: Recent Labs    05/19/19 0021  HGBA1C 5.5   CBG: Recent Labs  Lab 05/20/19 1108 05/20/19 1611 05/20/19 1612 05/20/19 2112 05/21/19 0727  GLUCAP 104* 91 94 120* 125*   Lipid Profile: No results for input(s): CHOL, HDL, LDLCALC, TRIG, CHOLHDL, LDLDIRECT in the last 72 hours. Thyroid Function Tests: Recent Labs    05/18/19 1448  TSH 0.022*   Anemia Panel: No results for input(s): VITAMINB12, FOLATE, FERRITIN, TIBC, IRON, RETICCTPCT in the last 72 hours. Sepsis Labs: No results for input(s): PROCALCITON, LATICACIDVEN in the last 168 hours.  Recent Results (from the past 240 hour(s))  Urine culture     Status: None   Collection Time: 05/18/19  5:20 PM   Specimen: Urine, Random  Result Value Ref Range Status   Specimen Description   Final    URINE, RANDOM Performed at [facility], [address] [place], [place] [address]    Special Requests   Final    NONE Performed at [facility], [address] [place], [place] [address]    Culture   Final    NO GROWTH Performed at [facility], [address], [place], [place] [address]    Report Status 05/19/2019 FINAL  Final  Respiratory Panel by RT PCR (Flu A&B, Covid) - Nasopharyngeal Swab     Status: None   Collection Time: 05/18/19  5:27 PM   Specimen: Nasopharyngeal Swab  Result Value Ref Range Status   SARS Coronavirus 2 by RT PCR NEGATIVE NEGATIVE Final    Comment: (NOTE) SARS-CoV-2 target nucleic acids are NOT DETECTED. The SARS-CoV-2 RNA is generally detectable in upper respiratoy specimens during the acute phase of infection. The lowest concentration of SARS-CoV-2 viral copies  this assay can detect is 131 copies/mL. A negative result does not preclude SARS-Cov-2 infection and should not be used as the sole basis for treatment or other patient management decisions. A negative result may occur with  improper specimen collection/handling, submission of specimen other than nasopharyngeal swab, presence of viral mutation(s) within the areas targeted by this assay, and inadequate number of viral copies (<131 copies/mL). A negative result must be combined with clinical observations, patient history, and epidemiological information. The expected result is Negative. Fact Sheet for Patients:  https://www.moore.com/ Fact Sheet for Healthcare Providers:  https://www.young.biz/ This test is not yet ap proved or cleared by the [place] FDA and  has been authorized for detection and/or diagnosis of SARS-CoV-2 by FDA under an Emergency Use Authorization (EUA). This EUA will remain  in effect (meaning this test can be used) for the duration of the COVID-19 declaration under Section 564(b)(1) of the Act, 21 U.S.C. section 360bbb-3(b)(1), unless the authorization is terminated or revoked sooner.    Influenza A by PCR NEGATIVE NEGATIVE Final   Influenza B by PCR NEGATIVE NEGATIVE Final    Comment: (NOTE) The Xpert Xpress SARS-CoV-2/FLU/RSV assay is intended as an aid in  the diagnosis of influenza from Nasopharyngeal swab specimens and  should not be used as a sole basis for treatment. Nasal washings and  aspirates are unacceptable for Xpert Xpress SARS-CoV-2/FLU/RSV  testing. Fact Sheet for Patients: https://www.moore.com/ Fact Sheet for Healthcare Providers: https://www.young.biz/ This test is not yet approved or cleared by the [place] FDA and  has been authorized for detection and/or diagnosis of SARS-CoV-2 by  FDA under an Emergency Use Authorization (EUA). This EUA will remain  in  effect (meaning this test can be used) for the duration of the  Covid-19 declaration under Section 564(b)(1) of the Act, 21  U.S.C. section 360bbb-3(b)(1), unless the authorization is  terminated or revoked. Performed at [facility], [address], [place], [place] [address]          Radiology Studies: No results found.      Scheduled Meds: . aspirin  325 mg Oral Q breakfast  . atorvastatin  40 mg Oral q1800  . enoxaparin (LOVENOX) injection  40 mg Subcutaneous Q24H  . feeding supplement (ENSURE ENLIVE)  237 mL Oral BID BM  . insulin aspart  0-5 Units Subcutaneous QHS  . insulin aspart  0-9 Units Subcutaneous TID WC  . mouth rinse  15 mL Mouth Rinse BID  .  potassium chloride SA  40 mEq Oral Once   Continuous Infusions: . sodium chloride    . potassium chloride       LOS: 3 days    Time spent: 30 minutes    Lovetta Condie Hoover Brunette, DO [facility] Pager [phone]  If 7PM-7AM, please contact night-coverage [email or web address] Password [facility] 05/21/2019, 10:15 AM

## 2019-05-21 NOTE — Progress Notes (Signed)
CRITICAL VALUE ALERT  Critical Value:  Potassium 2.7  Date & Time Notied:  [phone]  Provider Notified: Dr.Shah   Orders Received/Actions taken: awaiting for orders

## 2019-05-22 LAB — CBC
HCT: 33.5 % — ABNORMAL LOW (ref 36.0–46.0)
Hemoglobin: 11 g/dL — ABNORMAL LOW (ref 12.0–15.0)
MCH: 30.5 pg (ref 26.0–34.0)
MCHC: 32.8 g/dL (ref 30.0–36.0)
MCV: 92.8 fL (ref 80.0–100.0)
Platelets: 159 10*3/uL (ref 150–400)
RBC: 3.61 MIL/uL — ABNORMAL LOW (ref 3.87–5.11)
RDW: 15.3 % (ref 11.5–15.5)
WBC: 6 10*3/uL (ref 4.0–10.5)
nRBC: 0 % (ref 0.0–0.2)

## 2019-05-22 LAB — MAGNESIUM: Magnesium: 2.5 mg/dL — ABNORMAL HIGH (ref 1.7–2.4)

## 2019-05-22 LAB — BASIC METABOLIC PANEL
Anion gap: 7 (ref 5–15)
BUN: 5 mg/dL — ABNORMAL LOW (ref 6–20)
CO2: 28 mmol/L (ref 22–32)
Calcium: 8.2 mg/dL — ABNORMAL LOW (ref 8.9–10.3)
Chloride: 116 mmol/L — ABNORMAL HIGH (ref 98–111)
Creatinine, Ser: 0.47 mg/dL (ref 0.44–1.00)
GFR calc Af Amer: >60 mL/min (ref >60)
GFR calc non Af Amer: >60 mL/min (ref >60)
Glucose, Bld: 119 mg/dL — ABNORMAL HIGH (ref 70–99)
Potassium: 3.2 mmol/L — ABNORMAL LOW (ref 3.5–5.1)
Sodium: 151 mmol/L — ABNORMAL HIGH (ref 135–145)

## 2019-05-22 LAB — GLUCOSE, CAPILLARY
Glucose-Capillary: 93 mg/dL (ref 70–99)
Glucose-Capillary: 151 mg/dL — ABNORMAL HIGH (ref 70–99)
Glucose-Capillary: 106 mg/dL — ABNORMAL HIGH (ref 70–99)
Glucose-Capillary: 189 mg/dL — ABNORMAL HIGH (ref 70–99)

## 2019-05-22 MED ORDER — POTASSIUM CHLORIDE CRYS ER 20 MEQ PO TBCR
40.0000 meq | EXTENDED_RELEASE_TABLET | Freq: Once | ORAL | Status: AC
  Administered 2019-05-22: 40 meq via ORAL
  Filled 2019-05-22: qty 2

## 2019-05-22 MED ORDER — POTASSIUM CHLORIDE IN NACL 20-0.45 MEQ/L-% IV SOLN: INTRAVENOUS | Status: DC

## 2019-05-22 NOTE — Progress Notes (Signed)
Patient has midline in place, line is not providing blood return. Labs have not been obtained this AM.

## 2019-05-22 NOTE — Care Management Important Message (Signed)
Important Message  Patient Details  Name: Monica Ayers MRN: [id number] Date of Birth: July 22, 1960   Medicare Important Message Given:  Yes     Corey Harold 05/22/2019, 1:14 PM

## 2019-05-22 NOTE — TOC Progression Note (Signed)
Transition of Care [facility]) - Progression Note    Patient Details  Name: Monica Ayers MRN: [id number] Date of Birth: 1960-12-26  Transition of Care [facility]) CM/SW Contact  Leitha Bleak, RN Phone Number: 05/22/2019, 12:03 PM  Clinical Narrative:   PT is recommending HHPT. Husband is agreeable, they used [facility] in the past and would like to continue. Called Tim with [facility], he accepted the referral for HHPT.   Expected Discharge Plan: Home w Home Health Services Barriers to Discharge: Continued Medical Work up  Expected Discharge Plan and Services Expected Discharge Plan: Home w Home Health Services      Social Determinants of Health (SDOH) Interventions    Readmission Risk Interventions Readmission Risk Prevention Plan 05/19/2019 11/20/2018  Post Dischage Appt - Complete  Medication Screening - Complete  Transportation Screening Complete Complete  PCP or Specialist Appt within 3-5 Days Not Complete -  HRI or Home Care Consult Complete -  Social Work Consult for Recovery Care Planning/Counseling Complete -  Palliative Care Screening Not Complete -  Medication Review Oceanographer) Complete -  Some recent data might be hidden

## 2019-05-22 NOTE — Progress Notes (Signed)
PROGRESS NOTE    Monica Ayers  EHU:[id number] DOB: 05/08/1960 DOA: 05/18/2019 PCP: Freddy Finner, NP   Brief Narrative:  Per HPI: Monica Fenter Vernonis a 59 y.o.femalewith medical history significant forVascular dementia, hyper thyroidism, diabetes mellitus, hypertension. Patient at baseline is nonverbal, also does notappearto understand spoken words. History is obtained from chart review. It was sent to the ED reports that over the past 5 days, patient has not been eating or drinking, also she has been weak and unable to stand. Spouse also reported a strong odor to patient's urine and urine appearing dark red. Patient spouse is unable to care for patient at home due to progressive worsening of her condition. No reports of fever or cough. I spoke to patient spouse, he confirmed history. He reports at baseline patient ambulates by herself, but he sometimes supports her, thoughtheydohave a wheelchair. Also she understands what she has been told even though she is not able to talk.  1/15:Patient was admitted with what appears to be symptomatic hypernatremia from lack of oral intake along with some mild hypokalemia and UTI. She has been started on half-normal saline with some potassium supplementation and continues to have elevated sodium levels with repeat studies pending. She has been started empirically on IV Rocephin with urine cultures pending as well.  1/16:Patient appears to be doing much better today and was seen by her husband who agrees. Discontinue IV Rocephin as urine cultures demonstrated no growth. Laboratory data cannot be obtained as patient labs cannot be drawn. We will plan to place midline today for better IV access and lab draws.  1/17: Patient continues to do well and repeat labs demonstrate persistent hypernatremia, that is improving as well as some severe hypokalemia that will need IV supplementation.  Plan to discharge to home with home health PT  once stable.  1/18: Patient overall doing well, but somnolent this morning.  Sodium levels are slowly improving and potassium levels are improving as well, but she requires further supplementation prior to discharge.  Anticipate discharge in a.m. if improved further with home health PT.  Assessment & Plan:   Active Problems:   Hypernatremia   Hypernatremia-persistent, but improving -Likely related to poor oral intake with free water deficit of 2.6 L noted -Labs could not be obtained over the last 24-48 hours and patient has received midline to assist -Repeat demonstrates improving sodium levels, but she still requires hypotonic solution and will continue monitoring in a.m.  Hypokalemia -Continue repletion with IV fluid -Check repeat labs along with magnesium in a.m.  Generalized weaknesslikely secondary to electrolyte disturbances above -Discontinue IV Rocephin -PT evaluation with recommendation for SNF, will discuss with family once daily SNF versus home health tomorrow  Hypertension with improved blood pressure readings -Continue home Norvasc, lisinopril, propranolol  Diabetes -Stable blood glucose readings -Continue on sliding scale insulin -Hemoglobin A1c 5.5% -Continue to hold home Metformin  Vascular dementia with nonverbal status -Resume home aspirin and statins  DVT prophylaxis:Lovenox Code Status:Full Family Communication:Discussed with husband 1/17 Disposition Plan:Await further lab data to determine management.   Discharge to home health PT when able as family declines SNF   Consultants:  None  Procedures:  None  Antimicrobials:  Anti-infectives (From admission, onward)   Start     Dose/Rate Route Frequency Ordered Stop   05/19/19 2000  cefTRIAXone (ROCEPHIN) 1 g in sodium chloride 0.9 % 100 mL IVPB  Status:  Discontinued     1 g 200 mL/hr over 30 Minutes Intravenous Every  24 hours 05/18/19 2352 05/20/19 0712   05/18/19 1815   cefTRIAXone (ROCEPHIN) 1 g in sodium chloride 0.9 % 100 mL IVPB     1 g 200 mL/hr over 30 Minutes Intravenous  Once 05/18/19 1801 05/18/19 2019       Subjective: Patient seen and evaluated today with no new acute complaints or concerns. No acute concerns or events noted overnight.  She is more somnolent this morning.  Objective: Vitals:   05/20/19 2117 05/21/19 1353 05/21/19 2100 05/22/19 0500  BP: (!) 141/84 (!) 147/89 (!) 153/82 (!) 143/85  Pulse: 85 75 81 73  Resp: 19 16 18 18   Temp: 98.6 F (37 C) 98.9 F (37.2 C) 98.6 F (37 C) 98.7 F (37.1 C)  TempSrc: Oral  Oral Oral  SpO2: 100% 99% 96% 99%  Weight:      Height:        Intake/Output Summary (Last 24 hours) at 05/22/2019 1130 Last data filed at 05/22/2019 0900 Gross per 24 hour  Intake 856.88 ml  Output 803 ml  Net 53.88 ml   Filed Weights   05/18/19 1300 05/18/19 2352 05/20/19 0615  Weight: 65.1 kg 58.2 kg 62 kg    Examination:  General exam: Appears calm and comfortable, somnolent Respiratory system: Clear to auscultation. Respiratory effort normal. Cardiovascular system: S1 & S2 heard, RRR. No JVD, murmurs, rubs, gallops or clicks. No pedal edema. Gastrointestinal system: Abdomen is nondistended, soft and nontender. No organomegaly or masses felt. Normal bowel sounds heard. Central nervous system: Somnolent this a.m. Extremities: No edema Skin: No rashes, lesions or ulcers Psychiatry: Difficult to assess.    Data Reviewed: I have personally reviewed following labs and imaging studies  CBC: Recent Labs  Lab 05/18/19 1447 05/19/19 0021 05/21/19 0834 05/22/19 0543  WBC 7.5 6.2 5.0 6.0  NEUTROABS 4.2  --   --   --   HGB 14.2 12.6 10.3* 11.0*  HCT 47.0* 42.0 31.2* 33.5*  MCV 100.9* 101.7* 92.3 92.8  PLT 245 180 148* 159   Basic Metabolic Panel: Recent Labs  Lab 05/18/19 1447 05/19/19 0021 05/19/19 0511 05/21/19 0834 05/22/19 0543  NA 165* 166* 166* 155* 151*  K 3.1* 2.7* 3.3* 2.7* 3.2*   CL 128* 131* >130* 118* 116*  CO2 31 26 24 29 28   GLUCOSE 187* 183* 165* 140* 119*  BUN 34* 31* 27* 7 5*  CREATININE 0.81 0.75 0.75 0.61 0.47  CALCIUM 9.0 8.6* 8.2* 8.2* 8.2*  MG  --  2.7*  --   --  2.5*   GFR: Estimated Creatinine Clearance: 74.5 mL/min (by C-G formula based on SCr of 0.47 mg/dL). Liver Function Tests: Recent Labs  Lab 05/18/19 1447  AST 31  ALT 33  ALKPHOS 70  BILITOT 1.1  PROT 7.5  ALBUMIN 4.2   Recent Labs  Lab 05/18/19 1447  LIPASE 64*   No results for input(s): AMMONIA in the last 168 hours. Coagulation Profile: No results for input(s): INR, PROTIME in the last 168 hours. Cardiac Enzymes: No results for input(s): CKTOTAL, CKMB, CKMBINDEX, TROPONINI in the last 168 hours. BNP (last 3 results) No results for input(s): PROBNP in the last 8760 hours. HbA1C: No results for input(s): HGBA1C in the last 72 hours. CBG: Recent Labs  Lab 05/21/19 1119 05/21/19 1605 05/21/19 2104 05/22/19 0737 05/22/19 1113  GLUCAP 152* 122* 103* 93 151*   Lipid Profile: No results for input(s): CHOL, HDL, LDLCALC, TRIG, CHOLHDL, LDLDIRECT in the last 72 hours. Thyroid Function  Tests: No results for input(s): TSH, T4TOTAL, FREET4, T3FREE, THYROIDAB in the last 72 hours. Anemia Panel: No results for input(s): VITAMINB12, FOLATE, FERRITIN, TIBC, IRON, RETICCTPCT in the last 72 hours. Sepsis Labs: No results for input(s): PROCALCITON, LATICACIDVEN in the last 168 hours.  Recent Results (from the past 240 hour(s))  Urine culture     Status: None   Collection Time: 05/18/19  5:20 PM   Specimen: Urine, Random  Result Value Ref Range Status   Specimen Description   Final    URINE, RANDOM Performed at [facility], [address] [place], [place] [address]    Special Requests   Final    NONE Performed at [facility], [address] [place], [place] [address]    Culture   Final    NO GROWTH Performed at [facility], [place] [address] [place], [place]  [address]    Report Status 05/19/2019 FINAL  Final  Respiratory Panel by RT PCR (Flu A&B, Covid) - Nasopharyngeal Swab     Status: None   Collection Time: 05/18/19  5:27 PM   Specimen: Nasopharyngeal Swab  Result Value Ref Range Status   SARS Coronavirus 2 by RT PCR NEGATIVE NEGATIVE Final    Comment: (NOTE) SARS-CoV-2 target nucleic acids are NOT DETECTED. The SARS-CoV-2 RNA is generally detectable in upper respiratoy specimens during the acute phase of infection. The lowest concentration of SARS-CoV-2 viral copies this assay can detect is 131 copies/mL. A negative result does not preclude SARS-Cov-2 infection and should not be used as the sole basis for treatment or other patient management decisions. A negative result may occur with  improper specimen collection/handling, submission of specimen other than nasopharyngeal swab, presence of viral mutation(s) within the areas targeted by this assay, and inadequate number of viral copies (<131 copies/mL). A negative result must be combined with clinical observations, patient history, and epidemiological information. The expected result is Negative. Fact Sheet for Patients:  PinkCheek.be Fact Sheet for Healthcare Providers:  GravelBags.it This test is not yet ap proved or cleared by the [place] FDA and  has been authorized for detection and/or diagnosis of SARS-CoV-2 by FDA under an Emergency Use Authorization (EUA). This EUA will remain  in effect (meaning this test can be used) for the duration of the COVID-19 declaration under Section 564(b)(1) of the Act, 21 U.S.C. section 360bbb-3(b)(1), unless the authorization is terminated or revoked sooner.    Influenza A by PCR NEGATIVE NEGATIVE Final   Influenza B by PCR NEGATIVE NEGATIVE Final    Comment: (NOTE) The Xpert Xpress SARS-CoV-2/FLU/RSV assay is intended as an aid in  the diagnosis of influenza from Nasopharyngeal  swab specimens and  should not be used as a sole basis for treatment. Nasal washings and  aspirates are unacceptable for Xpert Xpress SARS-CoV-2/FLU/RSV  testing. Fact Sheet for Patients: PinkCheek.be Fact Sheet for Healthcare Providers: GravelBags.it This test is not yet approved or cleared by the [place] FDA and  has been authorized for detection and/or diagnosis of SARS-CoV-2 by  FDA under an Emergency Use Authorization (EUA). This EUA will remain  in effect (meaning this test can be used) for the duration of the  Covid-19 declaration under Section 564(b)(1) of the Act, 21  U.S.C. section 360bbb-3(b)(1), unless the authorization is  terminated or revoked. Performed at [facility], [address], [place],  [address]          Radiology Studies: No results found.      Scheduled  Meds: . aspirin  325 mg Oral Q breakfast  . atorvastatin  40 mg Oral q1800  . enoxaparin (LOVENOX) injection  40 mg Subcutaneous Q24H  . feeding supplement (ENSURE ENLIVE)  237 mL Oral BID BM  . insulin aspart  0-5 Units Subcutaneous QHS  . insulin aspart  0-9 Units Subcutaneous TID WC  . mouth rinse  15 mL Mouth Rinse BID  . potassium chloride SA  40 mEq Oral Once   Continuous Infusions: . 0.45 % NaCl with KCl 20 mEq / L 75 mL/hr at 05/22/19 0907     LOS: 4 days    Time spent: 30 minutes    Hrishikesh Hoeg Hoover Brunette, DO [facility] Pager [phone]  If 7PM-7AM, please contact night-coverage [email or web address] Password [email or web address] 05/22/2019, 11:30 AM

## 2019-05-23 ENCOUNTER — Telehealth: Payer: Self-pay | Admitting: *Deleted

## 2019-05-23 LAB — MAGNESIUM: Magnesium: 2.4 mg/dL (ref 1.7–2.4)

## 2019-05-23 LAB — BASIC METABOLIC PANEL
Anion gap: 5 (ref 5–15)
BUN: 5 mg/dL — ABNORMAL LOW (ref 6–20)
CO2: 28 mmol/L (ref 22–32)
Calcium: 8 mg/dL — ABNORMAL LOW (ref 8.9–10.3)
Chloride: 116 mmol/L — ABNORMAL HIGH (ref 98–111)
Creatinine, Ser: 0.54 mg/dL (ref 0.44–1.00)
GFR calc Af Amer: >60 mL/min (ref >60)
GFR calc non Af Amer: >60 mL/min (ref >60)
Glucose, Bld: 130 mg/dL — ABNORMAL HIGH (ref 70–99)
Potassium: 3.4 mmol/L — ABNORMAL LOW (ref 3.5–5.1)
Sodium: 149 mmol/L — ABNORMAL HIGH (ref 135–145)

## 2019-05-23 LAB — GLUCOSE, CAPILLARY: Glucose-Capillary: 93 mg/dL (ref 70–99)

## 2019-05-23 MED ORDER — POTASSIUM CHLORIDE CRYS ER 20 MEQ PO TBCR
40.0000 meq | EXTENDED_RELEASE_TABLET | Freq: Once | ORAL | Status: AC
  Administered 2019-05-23: 40 meq via ORAL
  Filled 2019-05-23: qty 2

## 2019-05-23 NOTE — Telephone Encounter (Signed)
Rescheduled patient for tomorrow at 2:40pm. Steward Drone is stating that patient is not wanting to be at home. It took spouse and another nurse just to get her in the [place] to come home. Spouse unable to care for patient anymore.

## 2019-05-23 NOTE — Progress Notes (Signed)
Nsg Discharge Note  Admit Date:  05/18/2019 Discharge date: 05/23/2019   Monica Ayers to be D/C'd Home per MD order.  AVS completed.  Copy for chart, and copy for patient signed, and dated. Patient/caregiver able to verbalize understanding.  Discharge Medication: Allergies as of 05/23/2019      Reactions   Latex Rash      Medication List    TAKE these medications   acetaminophen 325 MG tablet Commonly known as: TYLENOL Take 2 tablets (650 mg total) by mouth every 6 (six) hours as needed for mild pain, fever or headache (or Fever >/= 101).   amLODipine 5 MG tablet Commonly known as: NORVASC TAKE 1 TABLET DAILY FOR BLOOD PRESSURE What changed:   when to take this  additional instructions   aspirin 325 MG EC tablet Take 1 tablet (325 mg total) by mouth daily with breakfast. What changed: when to take this   atorvastatin 40 MG tablet Commonly known as: LIPITOR Take 1 tablet (40 mg total) by mouth daily at 6 PM.   lisinopril 10 MG tablet Commonly known as: ZESTRIL Take 1 tablet (10 mg total) by mouth daily. For BP   metFORMIN 500 MG tablet Commonly known as: GLUCOPHAGE Take 1 tablet (500 mg total) by mouth daily. What changed: when to take this   potassium chloride 10 MEQ tablet Commonly known as: KLOR-CON Take 1 tablet (10 mEq total) by mouth daily. What changed: when to take this   propranolol 20 MG tablet Commonly known as: INDERAL Take 1 tablet (20 mg total) by mouth 2 (two) times daily. What changed: when to take this       Discharge Assessment: Vitals:   05/22/19 2045 05/23/19 0444  BP:  126/81  Pulse:  83  Resp:  20  Temp:  (!) 97.5 F (36.4 C)  SpO2: 98% 99%   Skin clean, dry and intact without evidence of skin break down, no evidence of skin tears noted. IV catheter discontinued intact. Site without signs and symptoms of complications - no redness or edema noted at insertion site, patient denies c/o pain - only slight tenderness at site.   Dressing with slight pressure applied.  D/c Instructions-Education: Discharge instructions given to patient/family with verbalized understanding. D/c education completed with patient/family including follow up instructions, medication list, d/c activities limitations if indicated, with other d/c instructions as indicated by MD - patient able to verbalize understanding, all questions fully answered. Patient instructed to return to ED, call 911, or call MD for any changes in condition.  Patient escorted via WC, and D/C home via private auto.  Andria Rhein, RN 05/23/2019 10:58 AM

## 2019-05-23 NOTE — Discharge Summary (Signed)
Physician Discharge Summary  Monica Ayers YYQ:[id number] DOB: Oct 25, 1960 DOA: 05/18/2019  PCP: Freddy Finner, NP  Admit date: 05/18/2019  Discharge date: 05/23/2019  Admitted From:Home  Disposition:  Home  Recommendations for Outpatient Follow-up:  1. Follow up with PCP in 1-2 weeks 2. Please obtain BMP in one week to ensure stable sodium and potassium levels 3. Continue regular home potassium supplementation as prior  Home Health: Yes with PT  Equipment/Devices: None  Discharge Condition: Stable  CODE STATUS: Full  Diet recommendation: Heart Healthy/carb modified  Brief/Interim Summary: Per HPI: Monica Ayers a 59 y.o.femalewith medical history significant forVascular dementia, hyper thyroidism, diabetes mellitus, hypertension. Patient at baseline is nonverbal, also does notappearto understand spoken words. History is obtained from chart review. It was sent to the ED reports that over the past 5 days, patient has not been eating or drinking, also she has been weak and unable to stand. Spouse also reported a strong odor to patient's urine and urine appearing dark red. Patient spouse is unable to care for patient at home due to progressive worsening of her condition. No reports of fever or cough. I spoke to patient spouse, he confirmed history. He reports at baseline patient ambulates by herself, but he sometimes supports her, thoughtheydohave a wheelchair. Also she understands what she has been told even though she is not able to talk.  1/15:Patient was admitted with what appears to be symptomatic hypernatremia from lack of oral intake along with some mild hypokalemia and UTI. She has been started on half-normal saline with some potassium supplementation and continues to have elevated sodium levels with repeat studies pending. She has been started empirically on IV Rocephin with urine cultures pending as well.  1/16:Patient appears to be doing much  better today and was seen by her husband who agrees. Discontinue IV Rocephin as urine cultures demonstrated no growth. Laboratory data cannot be obtained as patient labs cannot be drawn. We will plan to place midline today for better IV access and lab draws.  1/17:Patient continues to do well and repeat labs demonstrate persistent hypernatremia, that is improving as well as some severe hypokalemia that will need IV supplementation. Plan to discharge to home with home health PT once stable.  1/18: Patient overall doing well, but somnolent this morning.  Sodium levels are slowly improving and potassium levels are improving as well, but she requires further supplementation prior to discharge.  Anticipate discharge in a.m. if improved further with home health PT.  1/19: Patient is awake and alert this morning with improved sodium and potassium levels noted.  Her sodium is now 149 and potassium is 3.4 and she is stable for discharge, but will need close monitoring with her PCP with repeat BMP levels.  She will discharged with home health physical therapy.  Discharge Diagnoses:  Active Problems:   Hypernatremia  Principal discharge diagnosis: Hypernatremia and hypokalemia secondary to poor oral intake.  Associated generalized weakness.  Discharge Instructions  Discharge Instructions    Diet - low sodium heart healthy   Complete by: As directed    Increase activity slowly   Complete by: As directed      Allergies as of 05/23/2019      Reactions   Latex Rash      Medication List    TAKE these medications   acetaminophen 325 MG tablet Commonly known as: TYLENOL Take 2 tablets (650 mg total) by mouth every 6 (six) hours as needed for mild pain, fever or headache (  or Fever >/= 101).   amLODipine 5 MG tablet Commonly known as: NORVASC TAKE 1 TABLET DAILY FOR BLOOD PRESSURE What changed:   when to take this  additional instructions   aspirin 325 MG EC tablet Take 1 tablet (325  mg total) by mouth daily with breakfast. What changed: when to take this   atorvastatin 40 MG tablet Commonly known as: LIPITOR Take 1 tablet (40 mg total) by mouth daily at 6 PM.   lisinopril 10 MG tablet Commonly known as: ZESTRIL Take 1 tablet (10 mg total) by mouth daily. For BP   metFORMIN 500 MG tablet Commonly known as: GLUCOPHAGE Take 1 tablet (500 mg total) by mouth daily. What changed: when to take this   potassium chloride 10 MEQ tablet Commonly known as: KLOR-CON Take 1 tablet (10 mEq total) by mouth daily. What changed: when to take this   propranolol 20 MG tablet Commonly known as: INDERAL Take 1 tablet (20 mg total) by mouth 2 (two) times daily. What changed: when to take this      Follow-up Information    [facility] Follow up.   Specialty: Home Health Services Why:  PT Contact information: [address] [place] [address] [phone]        Perlie Mayo, NP Follow up in 1 week(s).   Specialty: Family Medicine Contact information: [place] [address] [phone]        Herminio Commons, MD .   Specialty: Cardiology Contact information: [place] [address] [phone]          Allergies  Allergen Reactions  . Latex Rash    Consultations:  None   Procedures/Studies: CT Head Wo Contrast  Result Date: 05/13/2019 CLINICAL DATA:  Pain following fall EXAM: CT HEAD WITHOUT CONTRAST TECHNIQUE: Contiguous axial images were obtained from the base of the skull through the vertex without intravenous contrast. COMPARISON:  December 28, 2018 FINDINGS: Brain: There is moderate diffuse atrophy. There is enlargement each frontal horn of the lateral ventricles on an ex vacuo basis, stable. There is no intracranial mass, hemorrhage, extra-axial fluid collection, or midline shift. Small vessel disease is noted in the centra semiovale bilaterally with evidence of prior white matter infarcts in  the anterior right centra semiovale bilaterally, stable. No acute infarct is evident on this study. Vascular: There is no hyperdense vessel. There is calcification in each carotid siphon region. Skull: The bony calvarium a appears intact. Sinuses/Orbits: There is mucosal thickening in several ethmoid air cells. There is a concha bullosa on the left, an anatomic variant. Visualized orbits appear symmetric bilaterally. Other: Mastoid air cells are clear. IMPRESSION: Moderate diffuse atrophy, stable. There is a degree of ex vacuo enlargement of the frontal horn of each lateral ventricle. 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 No mass, hemorrhage, or acute appearing infarct. There are foci of arterial vascular calcification. There is mucosal thickening in several ethmoid air cells. Electronically Signed   By: Lowella Grip III M.D.   On: 05/13/2019 14:24   DG Chest Portable 1 View  Result Date: 05/18/2019 CLINICAL DATA:  Generalized weakness and dehydration EXAM: PORTABLE CHEST 1 VIEW COMPARISON:  December 28, 2018. FINDINGS: Lungs are clear. Heart size and pulmonary vascularity are normal. No adenopathy. There is postoperative change in the lower cervical region. IMPRESSION: Lungs clear.  Cardiac silhouette normal.  No adenopathy. Electronically  Signed   By: Bretta Bang III M.D.   On: 05/18/2019 14:34     Discharge Exam: Vitals:   05/22/19 2045 05/23/19 0444  BP:  126/81  Pulse:  83  Resp:  20  Temp:  (!) 97.5 F (36.4 C)  SpO2: 98% 99%   Vitals:   05/22/19 2024 05/22/19 2045 05/23/19 0444 05/23/19 0525  BP: (!) 145/82  126/81   Pulse: 93  83   Resp: 20  20   Temp: 99.3 F (37.4 C)  (!) 97.5 F (36.4 C)   TempSrc: Oral  Oral   SpO2: 98% 98% 99%   Weight:    61.6 kg  Height:        General: Pt is alert, awake, not in acute distress, nonverbal Cardiovascular: RRR, S1/S2 +, no rubs, no  gallops Respiratory: CTA bilaterally, no wheezing, no rhonchi, on room air Abdominal: Soft, NT, ND, bowel sounds + Extremities: no edema, no cyanosis    The results of significant diagnostics from this hospitalization (including imaging, microbiology, ancillary and laboratory) are listed below for reference.     Microbiology: Recent Results (from the past 240 hour(s))  Urine culture     Status: None   Collection Time: 05/18/19  5:20 PM   Specimen: Urine, Random  Result Value Ref Range Status   Specimen Description   Final    URINE, RANDOM Performed at [facility], [address] [place], [place] [address]    Special Requests   Final    NONE Performed at [facility], [address], [place], [place] [address]    Culture   Final    NO GROWTH Performed at [facility], [address], [place], [place] [address]    Report Status 05/19/2019 FINAL  Final  Respiratory Panel by RT PCR (Flu A&B, Covid) - Nasopharyngeal Swab     Status: None   Collection Time: 05/18/19  5:27 PM   Specimen: Nasopharyngeal Swab  Result Value Ref Range Status   SARS Coronavirus 2 by RT PCR NEGATIVE NEGATIVE Final    Comment: (NOTE) SARS-CoV-2 target nucleic acids are NOT DETECTED. The SARS-CoV-2 RNA is generally detectable in upper respiratoy specimens during the acute phase of infection. The lowest concentration of SARS-CoV-2 viral copies this assay can detect is 131 copies/mL. A negative result does not preclude SARS-Cov-2 infection and should not be used as the sole basis for treatment or other patient management decisions. A negative result may occur with  improper specimen collection/handling, submission of specimen other than nasopharyngeal swab, presence of viral mutation(s) within the areas targeted by this assay, and inadequate number of viral copies (<131 copies/mL). A negative result must be combined with clinical observations, patient history, and epidemiological information.  The expected result is Negative. Fact Sheet for Patients:  https://www.moore.com/ Fact Sheet for Healthcare Providers:  https://www.young.biz/ This test is not yet ap proved or cleared by the [place] FDA and  has been authorized for detection and/or diagnosis of SARS-CoV-2 by FDA under an Emergency Use Authorization (EUA). This EUA will remain  in effect (meaning this test can be used) for the duration of the COVID-19 declaration under Section 564(b)(1) of the Act, 21 U.S.C. section 360bbb-3(b)(1), unless the authorization is terminated or revoked sooner.    Influenza A by PCR NEGATIVE NEGATIVE Final   Influenza B by PCR NEGATIVE NEGATIVE Final    Comment: (NOTE) The Xpert Xpress SARS-CoV-2/FLU/RSV assay is intended as an aid in  the diagnosis of  influenza from Nasopharyngeal swab specimens and  should not be used as a sole basis for treatment. Nasal washings and  aspirates are unacceptable for Xpert Xpress SARS-CoV-2/FLU/RSV  testing. Fact Sheet for Patients: https://www.moore.com/https://www.fda.gov/media/142436/download Fact Sheet for Healthcare Providers: https://www.young.biz/https://www.fda.gov/media/142435/download This test is not yet approved or cleared by the [place] FDA and  has been authorized for detection and/or diagnosis of SARS-CoV-2 by  FDA under an Emergency Use Authorization (EUA). This EUA will remain  in effect (meaning this test can be used) for the duration of the  Covid-19 declaration under Section 564(b)(1) of the Act, 21  U.S.C. section 360bbb-3(b)(1), unless the authorization is  terminated or revoked. Performed at [facility], [address] [place], [place] [id number]      Labs: BNP (last 3 results) No results for input(s): BNP in the last 8760 hours. Basic Metabolic Panel: Recent Labs  Lab 05/19/19 0021 05/19/19 0511 05/21/19 0834 05/22/19 0543 05/23/19 0640  NA 166* 166* 155* 151* 149*  K 2.7* 3.3* 2.7* 3.2* 3.4*  CL 131* >130*  118* 116* 116*  CO2 26 24 29 28 28   GLUCOSE 183* 165* 140* 119* 130*  BUN 31* 27* 7 5* 5*  CREATININE 0.75 0.75 0.61 0.47 0.54  CALCIUM 8.6* 8.2* 8.2* 8.2* 8.0*  MG 2.7*  --   --  2.5* 2.4   Liver Function Tests: Recent Labs  Lab 05/18/19 1447  AST 31  ALT 33  ALKPHOS 70  BILITOT 1.1  PROT 7.5  ALBUMIN 4.2   Recent Labs  Lab 05/18/19 1447  LIPASE 64*   No results for input(s): AMMONIA in the last 168 hours. CBC: Recent Labs  Lab 05/18/19 1447 05/19/19 0021 05/21/19 0834 05/22/19 0543  WBC 7.5 6.2 5.0 6.0  NEUTROABS 4.2  --   --   --   HGB 14.2 12.6 10.3* 11.0*  HCT 47.0* 42.0 31.2* 33.5*  MCV 100.9* 101.7* 92.3 92.8  PLT 245 180 148* 159   Cardiac Enzymes: No results for input(s): CKTOTAL, CKMB, CKMBINDEX, TROPONINI in the last 168 hours. BNP: Invalid input(s): POCBNP CBG: Recent Labs  Lab 05/22/19 0737 05/22/19 1113 05/22/19 1632 05/22/19 2026 05/23/19 0757  GLUCAP 93 151* 106* 189* 93   D-Dimer No results for input(s): DDIMER in the last 72 hours. Hgb A1c No results for input(s): HGBA1C in the last 72 hours. Lipid Profile No results for input(s): CHOL, HDL, LDLCALC, TRIG, CHOLHDL, LDLDIRECT in the last 72 hours. Thyroid function studies No results for input(s): TSH, T4TOTAL, T3FREE, THYROIDAB in the last 72 hours.  Invalid input(s): FREET3 Anemia work up No results for input(s): VITAMINB12, FOLATE, FERRITIN, TIBC, IRON, RETICCTPCT in the last 72 hours. Urinalysis    Component Value Date/Time   COLORURINE YELLOW 05/20/2019 0238   APPEARANCEUR HAZY (A) 05/20/2019 0238   LABSPEC 1.023 05/20/2019 0238   PHURINE 5.0 05/20/2019 0238   GLUCOSEU 50 (A) 05/20/2019 0238   HGBUR MODERATE (A) 05/20/2019 0238   BILIRUBINUR NEGATIVE 05/20/2019 0238   KETONESUR NEGATIVE 05/20/2019 0238   PROTEINUR NEGATIVE 05/20/2019 0238   NITRITE NEGATIVE 05/20/2019 0238   LEUKOCYTESUR NEGATIVE 05/20/2019 0238   Sepsis Labs Invalid input(s): PROCALCITONIN,  WBC,   LACTICIDVEN Microbiology Recent Results (from the past 240 hour(s))  Urine culture     Status: None   Collection Time: 05/18/19  5:20 PM   Specimen: Urine, Random  Result Value Ref Range Status   Specimen Description   Final    URINE, RANDOM Performed at [facility], [address], [place], [place] [address]    Special Requests   Final    NONE Performed at [facility], [address], [place], [place] [address]    Culture   Final    NO GROWTH Performed at [facility], [address] [place], [place] [address]    Report Status 05/19/2019 FINAL  Final  Respiratory Panel by RT PCR (Flu A&B, Covid) - Nasopharyngeal Swab     Status: None   Collection Time: 05/18/19  5:27 PM   Specimen: Nasopharyngeal Swab  Result Value Ref Range Status   SARS Coronavirus 2 by RT PCR NEGATIVE NEGATIVE Final    Comment: (NOTE) SARS-CoV-2 target nucleic acids are NOT DETECTED. The SARS-CoV-2 RNA is generally detectable in upper respiratoy specimens during the acute phase of infection. The lowest concentration of SARS-CoV-2 viral copies this assay can detect is 131 copies/mL. A negative result does not preclude SARS-Cov-2 infection and should not be used as the sole basis for treatment or other patient management decisions. A negative result may occur with  improper specimen collection/handling, submission of specimen other than nasopharyngeal swab, presence of viral mutation(s) within the areas targeted by this assay, and inadequate number of viral copies (<131 copies/mL). A negative result must be combined with clinical observations, patient history, and epidemiological information. The expected result is Negative. Fact Sheet for Patients:  https://www.moore.com/ Fact Sheet for Healthcare Providers:  https://www.young.biz/ This test is not yet ap proved or cleared by the [place] FDA and  has been authorized for detection and/or diagnosis of  SARS-CoV-2 by FDA under an Emergency Use Authorization (EUA). This EUA will remain  in effect (meaning this test can be used) for the duration of the COVID-19 declaration under Section 564(b)(1) of the Act, 21 U.S.C. section 360bbb-3(b)(1), unless the authorization is terminated or revoked sooner.    Influenza A by PCR NEGATIVE NEGATIVE Final   Influenza B by PCR NEGATIVE NEGATIVE Final    Comment: (NOTE) The Xpert Xpress SARS-CoV-2/FLU/RSV assay is intended as an aid in  the diagnosis of influenza from Nasopharyngeal swab specimens and  should not be used as a sole basis for treatment. Nasal washings and  aspirates are unacceptable for Xpert Xpress SARS-CoV-2/FLU/RSV  testing. Fact Sheet for Patients: https://www.moore.com/ Fact Sheet for Healthcare Providers: https://www.young.biz/ This test is not yet approved or cleared by the [place] FDA and  has been authorized for detection and/or diagnosis of SARS-CoV-2 by  FDA under an Emergency Use Authorization (EUA). This EUA will remain  in effect (meaning this test can be used) for the duration of the  Covid-19 declaration under Section 564(b)(1) of the Act, 21  U.S.C. section 360bbb-3(b)(1), unless the authorization is  terminated or revoked. Performed at [facility], [address], [place], [place] [address]      Time coordinating discharge: 35 minutes  SIGNED:   Erick Blinks, DO [facility] 05/23/2019, 9:10 AM  If 7PM-7AM, please contact night-coverage [email or web address]

## 2019-05-23 NOTE — Progress Notes (Signed)
Physical Therapy Treatment Patient Details Name: Monica Ayers MRN: [id number] DOB: 08/21/1960 Today's Date: 05/23/2019    History of Present Illness Monica Ayers is a 59 y.o. female with medical history significant for  Vascular dementia, hyper thyroidism, diabetes mellitus, hypertension.  Patient at baseline is nonverbal, also does not appear to understand spoken words.  History is obtained from chart review.  It was sent to the ED reports that over the past 5 days, patient has not been eating or drinking, also she has been weak and unable to stand.  Spouse also reported a strong odor to patient's urine and urine appearing dark red.  Patient spouse is unable to care for patient at home due to progressive worsening of her condition.  No reports of fever or cough.    PT Comments    Pt non-verbal through session except did say "yes" when asked if willing to complete therapy today and shoke head "no" when asked if in any pain.  Therapeutic exercises complete with AAROM for strengthening and LE mobility.  Upon palpation to Rt foot pt stated "tickle".  Increased time and multimodal cueing required to follow commands with all exercises.  EOS pt left in bed with call bell within reach and bed alarm set.     Follow Up Recommendations  SNF     Equipment Recommendations  None recommended by PT    Recommendations for Other Services       Precautions / Restrictions Precautions Precautions: Fall Restrictions Weight Bearing Restrictions: No    Mobility  Bed Mobility Overal bed mobility: Needs Assistance                Transfers                    Ambulation/Gait                 Stairs             Wheelchair Mobility    Modified Rankin (Stroke Patients Only)       Balance                                            Cognition Arousal/Alertness: Awake/alert Behavior During Therapy: Flat affect Overall Cognitive Status: No  family/caregiver present to determine baseline cognitive functioning                                 General Comments: Pt did state "yes", "no", "tickle" upon palpation of Rt foot, and tendency to laugh.  Not always consistant to answering questions.  Did agree to participate with therapy and shoke head when asked if any pain.      Exercises General Exercises - Lower Extremity Ankle Circles/Pumps: AROM;AAROM;Both;10 reps;Supine Heel Slides: Both;10 reps;Supine Hip ABduction/ADduction: Both;10 reps;AAROM;Supine    General Comments        Pertinent Vitals/Pain Pain Assessment: No/denies pain    Home Living                      Prior Function            PT Goals (current goals can now be found in the care plan section)      Frequency    Min 3X/week      PT Plan  Co-evaluation              AM-PAC PT "6 Clicks" Mobility   Outcome Measure  Help needed turning from your back to your side while in a flat bed without using bedrails?: A Lot Help needed moving from lying on your back to sitting on the side of a flat bed without using bedrails?: A Lot Help needed moving to and from a bed to a chair (including a wheelchair)?: A Lot Help needed standing up from a chair using your arms (e.g., wheelchair or bedside chair)?: A Lot Help needed to walk in hospital room?: Total Help needed climbing 3-5 steps with a railing? : Total 6 Click Score: 10    End of Session   Activity Tolerance: Patient tolerated treatment well Patient left: in bed;with call bell/phone within reach;with bed alarm set Nurse Communication: Mobility status PT Visit Diagnosis: Unsteadiness on feet (R26.81);Other abnormalities of gait and mobility (R26.89);Muscle weakness (generalized) (M62.81)     Time: 2774-1287 PT Time Calculation (min) (ACUTE ONLY): 15 min  Charges:  $Therapeutic Exercise: 8-22 mins                     [address] LPTA;  CBIS [phone]   Juel Burrow 05/23/2019, 11:50 AM

## 2019-05-23 NOTE — Telephone Encounter (Signed)
Steward Drone pt sister in law called said they had cancelled tomorrows appt with Dahlia Client due to the patient being in the hospital however they still felt like the pt needed placement as the husband is not able to take care of her. They were wondering what they needed to do if they needed to reschedule or how that would work with her being in the hospital

## 2019-05-24 ENCOUNTER — Encounter: Payer: Self-pay | Admitting: Family Medicine

## 2019-05-24 ENCOUNTER — Other Ambulatory Visit: Payer: Self-pay

## 2019-05-24 ENCOUNTER — Ambulatory Visit: Payer: Medicare Other | Admitting: Family Medicine

## 2019-05-24 ENCOUNTER — Ambulatory Visit (INDEPENDENT_AMBULATORY_CARE_PROVIDER_SITE_OTHER): Payer: Medicare Other | Admitting: Family Medicine

## 2019-05-24 VITALS — Resp 15 | Ht 67.0 in | Wt 143.0 lb

## 2019-05-24 DIAGNOSIS — E785 Hyperlipidemia, unspecified: Secondary | ICD-10-CM | POA: Diagnosis not present

## 2019-05-24 DIAGNOSIS — G3109 Other frontotemporal dementia: Secondary | ICD-10-CM

## 2019-05-24 DIAGNOSIS — F0280 Dementia in other diseases classified elsewhere without behavioral disturbance: Secondary | ICD-10-CM

## 2019-05-24 MED ORDER — ATORVASTATIN CALCIUM 40 MG PO TABS: 40.0000 mg | ORAL_TABLET | Freq: Every day | ORAL | 3 refills | Status: DC

## 2019-05-24 NOTE — Progress Notes (Signed)
Virtual Visit via Telephone Note   This visit type was conducted due to national recommendations for restrictions regarding the COVID-19 Pandemic (e.g. social distancing) in an effort to limit this patient's exposure and mitigate transmission in our community.  Due to her co-morbid illnesses, this patient is at least at moderate risk for complications without adequate follow up.  This format is felt to be most appropriate for this patient at this time.  The patient did not have access to video technology/had technical difficulties with video requiring transitioning to audio format only (telephone).  All issues noted in this document were discussed and addressed.  No physical exam could be performed with this format.    Evaluation Performed:  Follow-up visit  Date:  05/28/2019   ID:  Monica Ayers, DOB 01/31/1961, MRN [id number]  Patient Location: Home Provider Location: Office  Location of Patient: Home Location of Provider: Telehealth Consent was obtain for visit to be over via telehealth. I verified that I am speaking with the correct person using two identifiers.  PCP:  Freddy Finner, NP   Chief Complaint: Need for help in home.  History of Present Illness:    Monica Ayers is a 59 y.o. female with with significant history including hypertension, recent frontal lobe dementia diagnosis, type 2 diabetes, hypothyroidism among others.  Husband who is historian secondary to dementia calls in today as he is unable to care for her as he used to.  She is becoming a lot more dependent is refusing to get in the bathtub use the restroom appropriately and at times eat.  He has become very concerned with her health and wellbeing and is unsure what to do wants to know what placement would be and how that would work and could affect her in him.  The patient does not have symptoms concerning for COVID-19 infection (fever, chills, cough, or new shortness of breath).   Past Medical,  Surgical, Social History, Allergies, and Medications have been Reviewed.  Past Medical History:  Diagnosis Date  . Allergy   . Dementia (HCC)   . Diabetes mellitus without complication (HCC)   . Hypertension   . Hyperthyroidism    Past Surgical History:  Procedure Laterality Date  . FRACTURE SURGERY    . KNEE SURGERY Right   . NECK SURGERY    . SHOULDER SURGERY Left      Current Meds  Medication Sig  . acetaminophen (TYLENOL) 325 MG tablet Take 2 tablets (650 mg total) by mouth every 6 (six) hours as needed for mild pain, fever or headache (or Fever >/= 101).  Marland Kitchen aspirin 325 MG EC tablet Take 1 tablet (325 mg total) by mouth daily with breakfast. (Patient taking differently: Take 325 mg by mouth every morning. )  . atorvastatin (LIPITOR) 40 MG tablet Take 1 tablet (40 mg total) by mouth daily at 6 PM.  . lisinopril (ZESTRIL) 10 MG tablet Take 1 tablet (10 mg total) by mouth daily. For BP  . metFORMIN (GLUCOPHAGE) 500 MG tablet Take 1 tablet (500 mg total) by mouth daily. (Patient taking differently: Take 500 mg by mouth every morning. )  . potassium chloride (KLOR-CON) 10 MEQ tablet Take 1 tablet (10 mEq total) by mouth daily. (Patient taking differently: Take 10 mEq by mouth every morning. )  . propranolol (INDERAL) 20 MG tablet Take 1 tablet (20 mg total) by mouth 2 (two) times daily. (Patient taking differently: Take 20 mg by mouth every morning. )  . [  DISCONTINUED] amLODipine (NORVASC) 5 MG tablet TAKE 1 TABLET DAILY FOR BLOOD PRESSURE (Patient taking differently: Take 5 mg by mouth every morning. )  . [DISCONTINUED] atorvastatin (LIPITOR) 40 MG tablet Take 1 tablet (40 mg total) by mouth daily at 6 PM.     Allergies:   Latex   ROS:   Please see the history of present illness.    All other systems reviewed and are negative.   Labs/Other Tests and Data Reviewed:    Recent Labs: 05/18/2019: ALT 33; TSH 0.022 05/22/2019: Hemoglobin 11.0; Platelets 159 05/23/2019: BUN 5;  Creatinine, Ser 0.54; Magnesium 2.4; Potassium 3.4; Sodium 149   Recent Lipid Panel Lab Results  Component Value Date/Time   CHOL 184 11/18/2018 08:00 AM   TRIG 195 (H) 11/18/2018 08:00 AM   HDL 34 (L) 11/18/2018 08:00 AM   CHOLHDL 5.4 11/18/2018 08:00 AM   LDLCALC 111 (H) 11/18/2018 08:00 AM    Wt Readings from Last 3 Encounters:  05/24/19 143 lb (64.9 kg)  05/23/19 135 lb 12.9 oz (61.6 kg)  03/28/19 143 lb 8 oz (65.1 kg)     Objective:    Vital Signs:  Resp 15   Ht 5\' 7"  (1.702 m)   Wt 143 lb (64.9 kg)   BMI 22.40 kg/m    GEN:  Alert and oriented RESPIRATORY:  No shortness of breath noted in conversation.  Patient does limited talking. PSYCH:  Inabi at baseline.  ASSESSMENT & PLAN:     1. Frontal lobe dementia ([place])   2. Hyperlipidemia, unspecified hyperlipidemia type   Time:   Today, I have spent 13 minutes with the patient with telehealth technology discussing the above problems.     Medication Adjustments/Labs and Tests Ordered: Current medicines are reviewed at length with the patient today.  Concerns regarding medicines are outlined above.   Tests Ordered: No orders of the defined types were placed in this encounter.   Medication Changes: Meds ordered this encounter  Medications  . atorvastatin (LIPITOR) 40 MG tablet    Sig: Take 1 tablet (40 mg total) by mouth daily at 6 PM.    Dispense:  30 tablet    Refill:  3    Order Specific Question:   Supervising Provider    Answer:   Fayrene Helper [[id number]]    Disposition:  Follow up  2 weeks   Signed, Perlie Mayo, NP  05/28/2019 6:33 PM     [facility]

## 2019-05-26 ENCOUNTER — Telehealth: Payer: Self-pay | Admitting: *Deleted

## 2019-05-26 ENCOUNTER — Other Ambulatory Visit: Payer: Self-pay

## 2019-05-26 ENCOUNTER — Other Ambulatory Visit: Payer: Self-pay | Admitting: "Endocrinology

## 2019-05-26 DIAGNOSIS — I10 Essential (primary) hypertension: Secondary | ICD-10-CM

## 2019-05-26 MED ORDER — AMLODIPINE BESYLATE 5 MG PO TABS: 5.0000 mg | ORAL_TABLET | Freq: Every day | ORAL | 1 refills | Status: DC

## 2019-05-26 NOTE — Telephone Encounter (Signed)
Medication refilled and sent to pharmacy.

## 2019-05-26 NOTE — Telephone Encounter (Signed)
Pt husband Fayrene Fearing called pt needs bp med amlodopine sent to drug store in [place]

## 2019-05-28 ENCOUNTER — Encounter: Payer: Self-pay | Admitting: Family Medicine

## 2019-05-28 DIAGNOSIS — E785 Hyperlipidemia, unspecified: Secondary | ICD-10-CM | POA: Insufficient documentation

## 2019-05-28 NOTE — Assessment & Plan Note (Signed)
Refills given.  Though given this current situation with her dementia unsure of the benefit will be continuing for now.  And assessing at later time if needed to continue.

## 2019-05-28 NOTE — Assessment & Plan Note (Signed)
Has been reports that she is gotten worse.  He has a hard time getting her into the bath to shower.  At times she will eat.  She has become more nonverbal per him.  She is just getting harder and harder for him to take care of and he wants to make sure that she has the best care possible.  She gets a little bit agitated when he tries to help her get cleaned up.  Wanted to know if he could get some in-home health versus going to a skilled facility.  We have set him up for hospice at home.  Versus giving him the numbers to several skilled facilities around the area.  Him and his sister are going to discuss what might be best for their family.  And call us back.

## 2019-05-29 ENCOUNTER — Telehealth: Payer: Self-pay | Admitting: *Deleted

## 2019-05-29 NOTE — Telephone Encounter (Signed)
Lanae Boast physical therapist with [facility] called they tried to do physical therapy assessment on pt on 05-26-19 and made 3 attempts to reach pt husband with no return response so they were unable to provide services 05-26-19. She can be reached at [phone]

## 2019-05-30 ENCOUNTER — Telehealth: Payer: Self-pay | Admitting: *Deleted

## 2019-05-30 NOTE — Telephone Encounter (Signed)
Nicholos Johns with [facility] called for verbal orders 1 week 4 for physical therapy and request a MSW Referral. She can be reached at [phone]

## 2019-05-30 NOTE — Telephone Encounter (Signed)
FYI

## 2019-05-30 NOTE — Telephone Encounter (Signed)
Spoke with Nicholos Johns and gave verbal orders.

## 2019-06-14 ENCOUNTER — Telehealth: Payer: Self-pay

## 2019-06-14 NOTE — Telephone Encounter (Signed)
FL2 completed. Steward Drone notified. She will pick up from front desk tomorrow 2/11. 2 copies left per her request.

## 2019-06-14 NOTE — Telephone Encounter (Signed)
Please complete a FL2 form for the pt she is going to [facility] --Please call Steward Drone back to advise

## 2019-06-15 ENCOUNTER — Telehealth: Payer: Self-pay

## 2019-06-15 NOTE — Telephone Encounter (Signed)
Demo sheet and last office visit notes faxed to [facility]

## 2019-06-15 NOTE — Telephone Encounter (Signed)
Please fax Demo sheet and progress notes faxed  to [facility]

## 2019-06-23 ENCOUNTER — Encounter: Payer: Self-pay | Admitting: Family Medicine

## 2019-06-23 ENCOUNTER — Ambulatory Visit (INDEPENDENT_AMBULATORY_CARE_PROVIDER_SITE_OTHER): Payer: Medicare Other | Admitting: Family Medicine

## 2019-06-23 ENCOUNTER — Other Ambulatory Visit: Payer: Self-pay

## 2019-06-23 DIAGNOSIS — G3109 Other frontotemporal dementia: Secondary | ICD-10-CM | POA: Diagnosis not present

## 2019-06-23 DIAGNOSIS — R2681 Unsteadiness on feet: Secondary | ICD-10-CM | POA: Diagnosis not present

## 2019-06-23 DIAGNOSIS — F0280 Dementia in other diseases classified elsewhere without behavioral disturbance: Secondary | ICD-10-CM

## 2019-06-23 NOTE — Assessment & Plan Note (Signed)
Has ongoing unsteady gait.  Requires to use a walker and assistance.  Is having placement into facility at the end of the month.

## 2019-06-23 NOTE — Patient Instructions (Signed)
Happy New Year! May you have a year filled with hope, love, happiness and laughter.  I appreciate the opportunity to provide you with care for your health and wellness. Today we discussed: up coming placement in [facility]  Follow up: 4 months   No labs or referrals today  Please continue to practice social distancing to keep you, your family, and our community safe.  If you must go out, please wear a mask and practice good handwashing.  It was a pleasure to see you and I look forward to continuing to work together on your health and well-being. Please do not hesitate to call the office if you need care or have questions about your care.  Have a wonderful day and week. With Gratitude, Tereasa Coop, DNP, AGNP-BC

## 2019-06-23 NOTE — Progress Notes (Signed)
Virtual Visit via Telephone Note   This visit type was conducted due to national recommendations for restrictions regarding the COVID-19 Pandemic (e.g. social distancing) in an effort to limit this patient's exposure and mitigate transmission in our community.  Due to her co-morbid illnesses, this patient is at least at moderate risk for complications without adequate follow up.  This format is felt to be most appropriate for this patient at this time.  The patient did not have access to video technology/had technical difficulties with video requiring transitioning to audio format only (telephone).  All issues noted in this document were discussed and addressed.  No physical exam could be performed with this format.   Evaluation Performed:  Follow-up visit  Date:  06/23/2019   ID:  Monica Ayers, DOB January 28, 1961, MRN [id number]  Patient Location: Home Provider Location: Office  Location of Patient: Home Location of Provider: Telehealth Consent was obtain for visit to be over via telehealth. I verified that I am speaking with the correct person using two identifiers.  PCP:  Freddy Finner, NP   Chief Complaint: Follow-up for placement  History of Present Illness:    ELANNA BERT is a 59 y.o. female with significant history of hypertension, recent frontal lobe dementia, type 2 diabetes, hypothyroidism among others.  Heavy discussion in January based off of need for placement versus hospice care at home.  Family has decided to do placement into [facility].  Paperwork has been filled out and sent over husband reports today that she will be going at the end of the month.  He reports that she will eat for him pretty well.  And take medicines okay.  But she has become very defiant about refusing her bathtub and personal hygiene needs.  Does not communicate very well.  Does not allow him to put on a pull-up at night so she soils the bed regularly.  It is become very difficult for him  to try to help her get the care that she needs.  The patient does not have symptoms concerning for COVID-19 infection (fever, chills, cough, or new shortness of breath).   Past Medical, Surgical, Social History, Allergies, and Medications have been Reviewed.  Past Medical History:  Diagnosis Date  . Allergy   . Dementia (HCC)   . Diabetes mellitus without complication (HCC)   . Hypertension   . Hyperthyroidism    Past Surgical History:  Procedure Laterality Date  . FRACTURE SURGERY    . KNEE SURGERY Right   . NECK SURGERY    . SHOULDER SURGERY Left      No outpatient medications have been marked as taking for the 06/23/19 encounter (Appointment) with Freddy Finner, NP.     Allergies:   Latex   ROS:   Please see the history of present illness.    All other systems reviewed and are negative.   Labs/Other Tests and Data Reviewed:    Recent Labs: 05/18/2019: ALT 33; TSH 0.022 05/22/2019: Hemoglobin 11.0; Platelets 159 05/23/2019: BUN 5; Creatinine, Ser 0.54; Magnesium 2.4; Potassium 3.4; Sodium 149   Recent Lipid Panel Lab Results  Component Value Date/Time   CHOL 184 11/18/2018 08:00 AM   TRIG 195 (H) 11/18/2018 08:00 AM   HDL 34 (L) 11/18/2018 08:00 AM   CHOLHDL 5.4 11/18/2018 08:00 AM   LDLCALC 111 (H) 11/18/2018 08:00 AM    Wt Readings from Last 3 Encounters:  05/24/19 143 lb (64.9 kg)  05/23/19 135 lb  12.9 oz (61.6 kg)  03/28/19 143 lb 8 oz (65.1 kg)     Objective:    Vital Signs:  There were no vitals taken for this visit.  Husband is historian secondary to advanced dementia.  ASSESSMENT & PLAN:    1. Frontal lobe dementia ([place])   2. Unsteady gait     Time:   Today, I have spent 10 minutes with the patient with telehealth technology discussing the above problems.     Medication Adjustments/Labs and Tests Ordered: Current medicines are reviewed at length with the patient today.  Concerns regarding medicines are outlined above.   Tests  Ordered: No orders of the defined types were placed in this encounter.   Medication Changes: No orders of the defined types were placed in this encounter.   Disposition:  Follow up 4 months  Signed, Perlie Mayo, NP  06/23/2019 10:39 AM     [place]

## 2019-06-23 NOTE — Assessment & Plan Note (Signed)
Placement to [facility].  She will be going in at the end of February beginning of March.  Has been is very sad about the situation extensive discussion on the most appropriate thing for her is to get the medical care that she needs.  He is in agreements to this.  At this time he reports that he might have them take over her health care he is unsure currently.  Put in for follow-up in 4 months after she has been fully established there for 3 months to see how everything is going and whether or not they want to do her care in house versus bring her here.

## 2019-06-26 ENCOUNTER — Telehealth: Payer: Self-pay

## 2019-06-26 NOTE — Telephone Encounter (Signed)
Please call Doris the Child psychotherapist with [facility] regarding the patient and the [facility] form

## 2019-06-26 NOTE — Telephone Encounter (Signed)
Called Child psychotherapist. No answer, left message asking for call back.

## 2019-06-27 NOTE — Telephone Encounter (Signed)
Left generic message asking for call back  

## 2019-06-28 ENCOUNTER — Ambulatory Visit: Payer: Medicare Other | Admitting: Family Medicine

## 2019-06-29 NOTE — Telephone Encounter (Signed)
FL2 form has been cosigned by Dr.Simpson

## 2019-10-18 ENCOUNTER — Ambulatory Visit: Payer: Medicare Other | Admitting: Family Medicine

## 2020-08-14 IMAGING — CT CT HEAD W/O CM
3 of 6 series · 13 of 47 positions shown, 15 images · non-contrast
Comparison: December 28, 2018

CLINICAL DATA: Pain following fall

EXAM:
CT HEAD WITHOUT CONTRAST
TECHNIQUE: Contiguous axial images were obtained from the base of the skull
through the vertex without intravenous contrast.

[Series 2: head w o · axial · 0.49mm/px · z∈[+51,+186]mm · 8 of 35 slices shown, 10 images]
[im 4/35  brain]
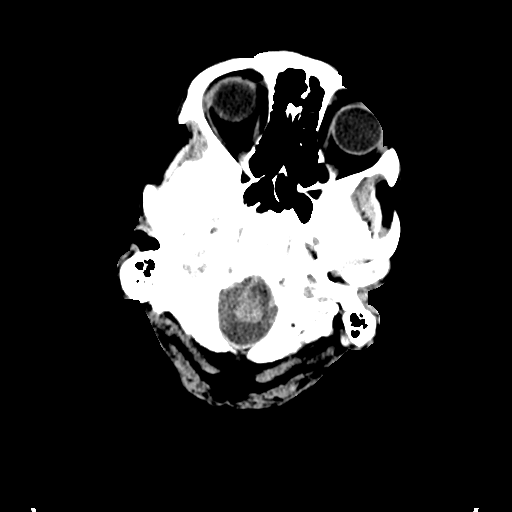
[im 4/35  bone]
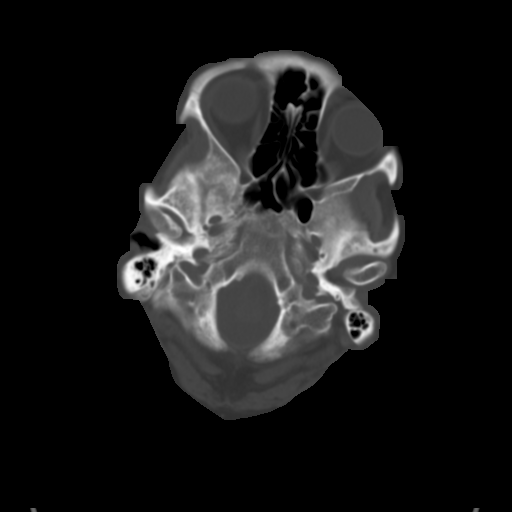
[im 8/35  brain]
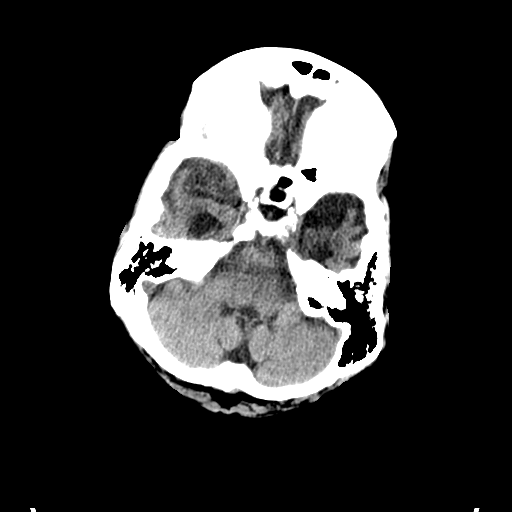
[im 11/35  brain]
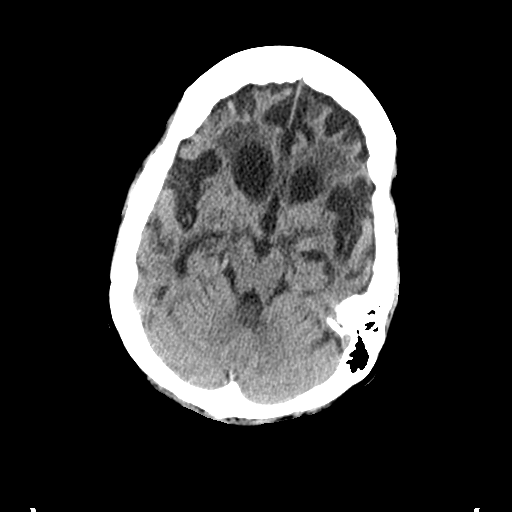
[im 15/35  brain]
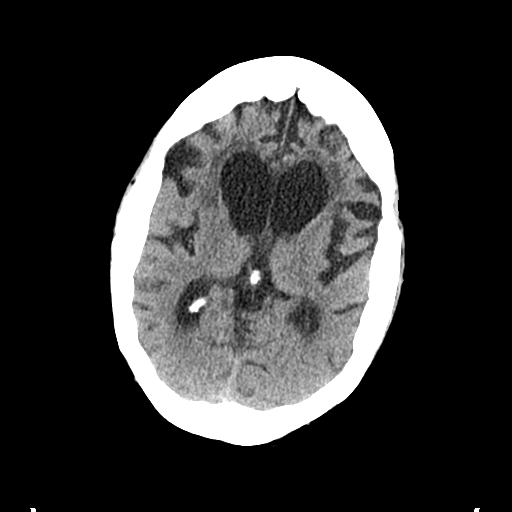
[im 20/35  brain]
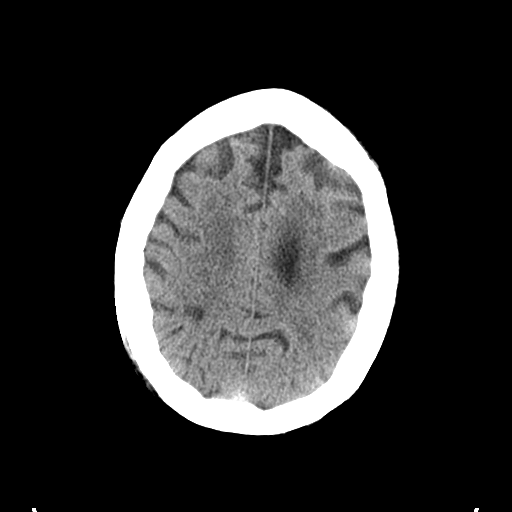
[im 20/35  bone]
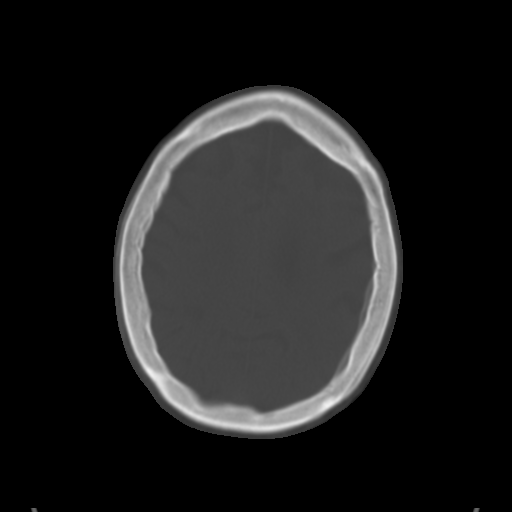
[im 24/35  brain]
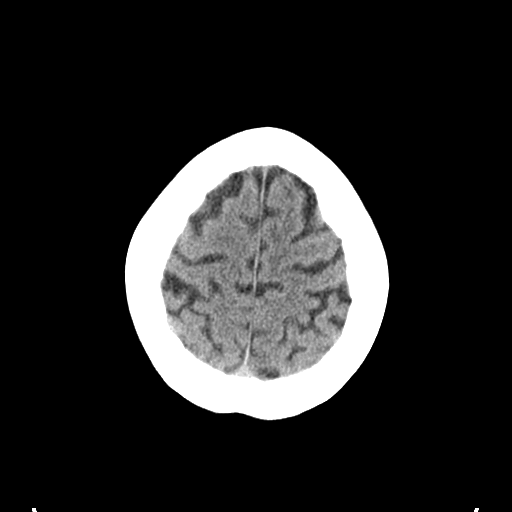
[im 27/35  brain]
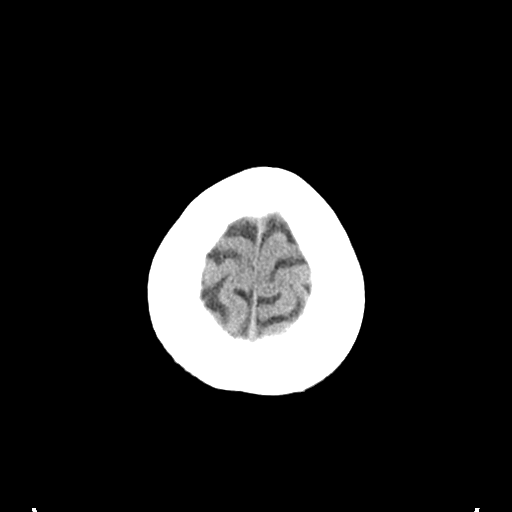
[im 31/35  brain]
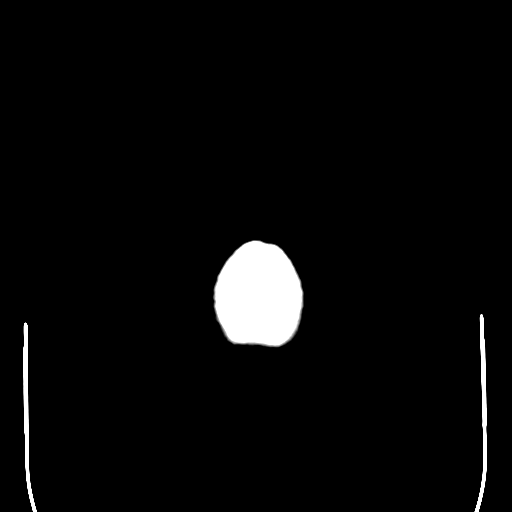

[Series 8: coronal soft · coronal · 0.21mm/px · 3 of 66 slices shown]
[im 17/66  brain]
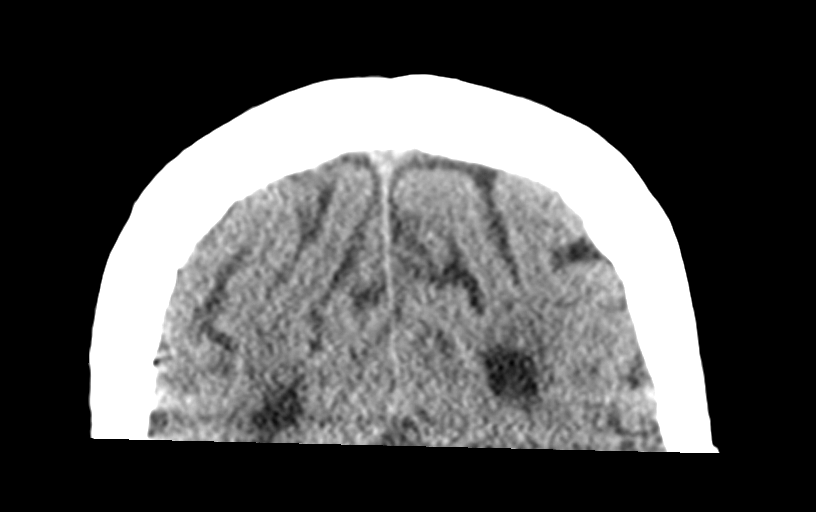
[im 33/66  brain]
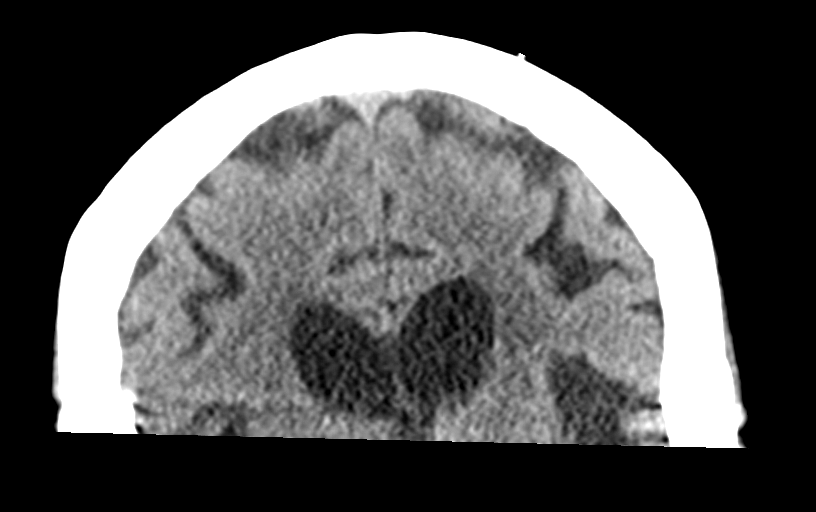
[im 49/66  brain]
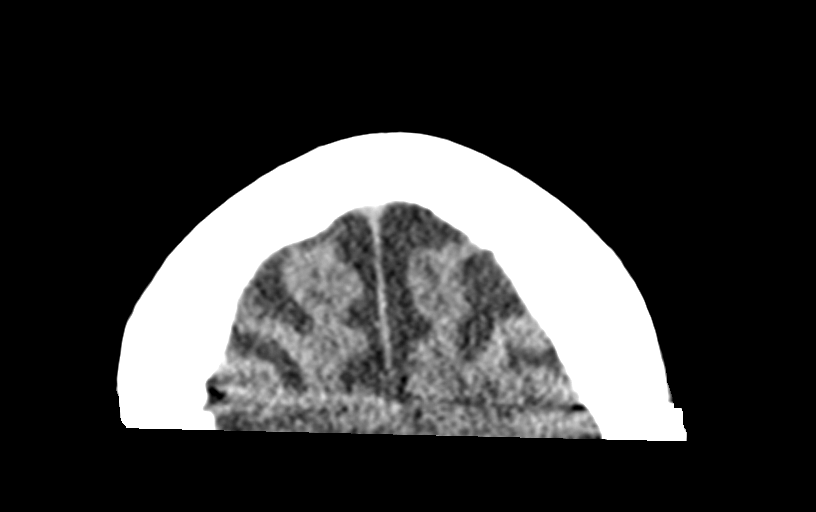

[Series 9: sagittal soft · sagittal · 0.20mm/px · 2 of 52 slices shown]
[im 18/52  brain]
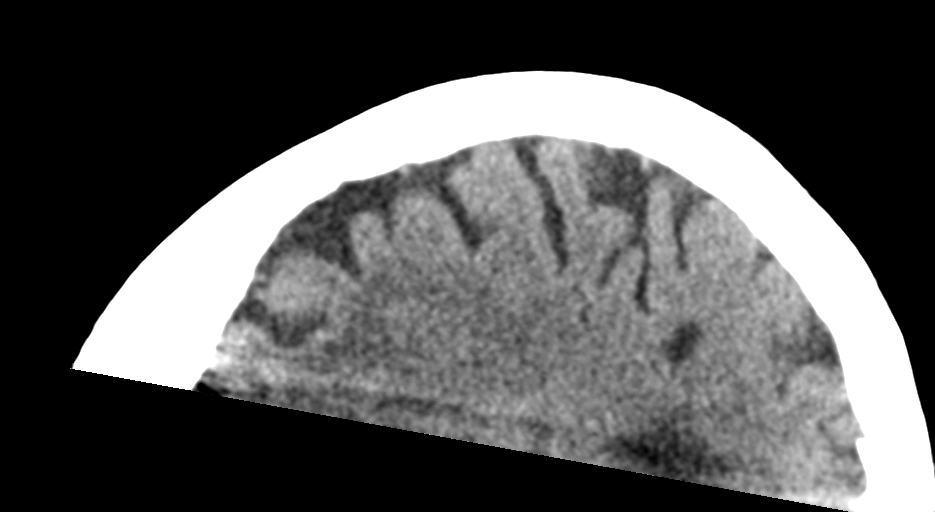
[im 35/52  brain]
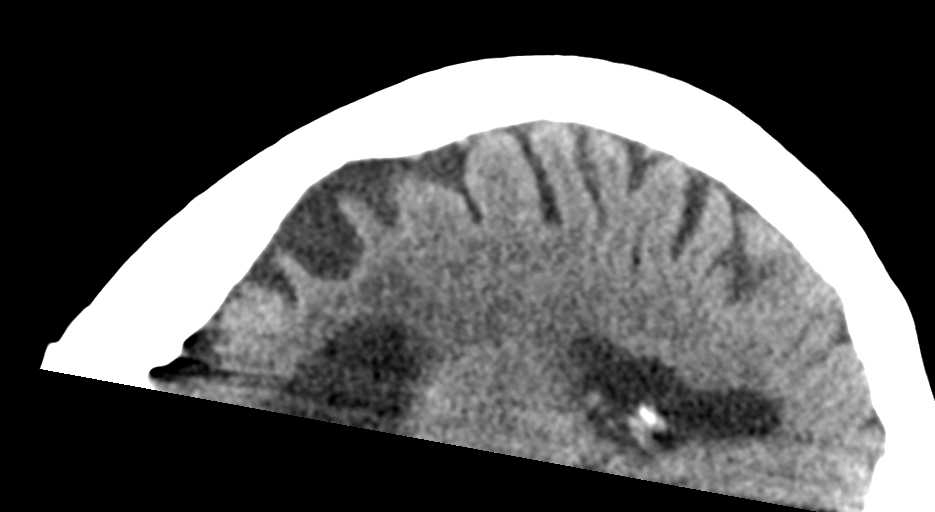

[13 of 47 positions shown; findings below may reference images not displayed]

FINDINGS: Brain: There is moderate diffuse atrophy. There is enlargement each
frontal horn of the lateral ventricles on an ex vacuo basis, stable.
There is no intracranial mass, hemorrhage, extra-axial fluid
collection, or midline shift. Small vessel disease is noted in the
centra semiovale bilaterally with evidence of prior white matter
infarcts in the anterior right centra semiovale bilaterally, stable.
No acute infarct is evident on this study.

Vascular: There is no hyperdense vessel. There is calcification in
each carotid siphon region.

Skull: The bony calvarium a appears intact.

Sinuses/Orbits: There is mucosal thickening in several ethmoid air
cells. There is a concha bullosa on the left, an anatomic variant.
Visualized orbits appear symmetric bilaterally.

Other: Mastoid air cells are clear.
IMPRESSION: Moderate diffuse atrophy, stable. There is a degree of ex vacuo
enlargement of the frontal horn of each lateral ventricle. There is
periventricular small vessel disease with white matter infarcts in
the anterior centra semiovale bilaterally. No mass, hemorrhage, or
acute appearing infarct.

There are foci of arterial vascular calcification. There is mucosal
thickening in several ethmoid air cells.

## 2021-11-03 ENCOUNTER — Emergency Department (HOSPITAL_COMMUNITY): Payer: Medicare Other

## 2021-11-03 ENCOUNTER — Inpatient Hospital Stay (HOSPITAL_COMMUNITY)
Admission: EM | Admit: 2021-11-03 | Discharge: 2021-11-11 | DRG: 522 | Disposition: A | Discharge status: Skilled Nursing Facility | Payer: Medicare Other | Attending: Internal Medicine | Admitting: Internal Medicine

## 2021-11-03 ENCOUNTER — Other Ambulatory Visit: Payer: Self-pay

## 2021-11-03 ENCOUNTER — Encounter (HOSPITAL_COMMUNITY): Payer: Self-pay

## 2021-11-03 DIAGNOSIS — Z7984 Long term (current) use of oral hypoglycemic drugs: Secondary | ICD-10-CM

## 2021-11-03 DIAGNOSIS — F0280 Dementia in other diseases classified elsewhere without behavioral disturbance: Secondary | ICD-10-CM | POA: Diagnosis not present

## 2021-11-03 DIAGNOSIS — I10 Essential (primary) hypertension: Secondary | ICD-10-CM | POA: Diagnosis present

## 2021-11-03 DIAGNOSIS — S72001P Fracture of unspecified part of neck of right femur, subsequent encounter for closed fracture with malunion: Secondary | ICD-10-CM | POA: Diagnosis not present

## 2021-11-03 DIAGNOSIS — W19XXXA Unspecified fall, initial encounter: Principal | ICD-10-CM

## 2021-11-03 DIAGNOSIS — R000 Tachycardia, unspecified: Secondary | ICD-10-CM | POA: Diagnosis not present

## 2021-11-03 DIAGNOSIS — Z87891 Personal history of nicotine dependence: Secondary | ICD-10-CM

## 2021-11-03 DIAGNOSIS — E1165 Type 2 diabetes mellitus with hyperglycemia: Secondary | ICD-10-CM | POA: Diagnosis not present

## 2021-11-03 DIAGNOSIS — F0150 Vascular dementia without behavioral disturbance: Secondary | ICD-10-CM | POA: Diagnosis present

## 2021-11-03 DIAGNOSIS — E876 Hypokalemia: Secondary | ICD-10-CM | POA: Diagnosis not present

## 2021-11-03 DIAGNOSIS — W1830XA Fall on same level, unspecified, initial encounter: Secondary | ICD-10-CM | POA: Diagnosis present

## 2021-11-03 DIAGNOSIS — R509 Fever, unspecified: Secondary | ICD-10-CM | POA: Diagnosis not present

## 2021-11-03 DIAGNOSIS — Z781 Physical restraint status: Secondary | ICD-10-CM

## 2021-11-03 DIAGNOSIS — R7989 Other specified abnormal findings of blood chemistry: Secondary | ICD-10-CM | POA: Diagnosis not present

## 2021-11-03 DIAGNOSIS — Z79899 Other long term (current) drug therapy: Secondary | ICD-10-CM

## 2021-11-03 DIAGNOSIS — Y92129 Unspecified place in nursing home as the place of occurrence of the external cause: Secondary | ICD-10-CM | POA: Diagnosis not present

## 2021-11-03 DIAGNOSIS — G3109 Other frontotemporal dementia: Secondary | ICD-10-CM | POA: Diagnosis present

## 2021-11-03 DIAGNOSIS — N390 Urinary tract infection, site not specified: Secondary | ICD-10-CM | POA: Clinically undetermined

## 2021-11-03 DIAGNOSIS — Z91040 Latex allergy status: Secondary | ICD-10-CM

## 2021-11-03 DIAGNOSIS — Z7982 Long term (current) use of aspirin: Secondary | ICD-10-CM

## 2021-11-03 DIAGNOSIS — E119 Type 2 diabetes mellitus without complications: Secondary | ICD-10-CM | POA: Diagnosis not present

## 2021-11-03 DIAGNOSIS — E0590 Thyrotoxicosis, unspecified without thyrotoxic crisis or storm: Secondary | ICD-10-CM | POA: Diagnosis present

## 2021-11-03 DIAGNOSIS — S72001A Fracture of unspecified part of neck of right femur, initial encounter for closed fracture: Principal | ICD-10-CM | POA: Diagnosis present

## 2021-11-03 DIAGNOSIS — B9620 Unspecified Escherichia coli [E. coli] as the cause of diseases classified elsewhere: Secondary | ICD-10-CM | POA: Diagnosis not present

## 2021-11-03 LAB — CBC WITH DIFFERENTIAL/PLATELET
Abs Immature Granulocytes: 0.04 10*3/uL (ref 0.00–0.07)
Basophils Absolute: 0 10*3/uL (ref 0.0–0.1)
Basophils Relative: 0 %
Eosinophils Absolute: 0 10*3/uL (ref 0.0–0.5)
Eosinophils Relative: 0 %
HCT: 40.4 % (ref 36.0–46.0)
Hemoglobin: 13.8 g/dL (ref 12.0–15.0)
Immature Granulocytes: 0 %
Lymphocytes Relative: 8 %
Lymphs Abs: 0.9 10*3/uL (ref 0.7–4.0)
MCH: 29.4 pg (ref 26.0–34.0)
MCHC: 34.2 g/dL (ref 30.0–36.0)
MCV: 86.1 fL (ref 80.0–100.0)
Monocytes Absolute: 1.2 10*3/uL — ABNORMAL HIGH (ref 0.1–1.0)
Monocytes Relative: 10 %
Neutro Abs: 9.8 10*3/uL — ABNORMAL HIGH (ref 1.7–7.7)
Neutrophils Relative %: 82 %
Platelets: 235 10*3/uL (ref 150–400)
RBC: 4.69 MIL/uL (ref 3.87–5.11)
RDW: 13.8 % (ref 11.5–15.5)
WBC: 12 10*3/uL — ABNORMAL HIGH (ref 4.0–10.5)
nRBC: 0 % (ref 0.0–0.2)

## 2021-11-03 LAB — CBG MONITORING, ED: Glucose-Capillary: 187 mg/dL — ABNORMAL HIGH (ref 70–99)

## 2021-11-03 LAB — BASIC METABOLIC PANEL
Anion gap: 8 (ref 5–15)
BUN: 11 mg/dL (ref 8–23)
CO2: 23 mmol/L (ref 22–32)
Calcium: 8.7 mg/dL — ABNORMAL LOW (ref 8.9–10.3)
Chloride: 106 mmol/L (ref 98–111)
Creatinine, Ser: 0.56 mg/dL (ref 0.44–1.00)
GFR, Estimated: >60 mL/min (ref >60)
Glucose, Bld: 219 mg/dL — ABNORMAL HIGH (ref 70–99)
Potassium: 3.6 mmol/L (ref 3.5–5.1)
Sodium: 137 mmol/L (ref 135–145)

## 2021-11-03 LAB — GLUCOSE, CAPILLARY: Glucose-Capillary: 177 mg/dL — ABNORMAL HIGH (ref 70–99)

## 2021-11-03 LAB — HEMOGLOBIN A1C
Hgb A1c MFr Bld: 6.1 % — ABNORMAL HIGH (ref 4.8–5.6)
Mean Plasma Glucose: 128.37 mg/dL

## 2021-11-03 LAB — HIV ANTIBODY (ROUTINE TESTING W REFLEX): HIV Screen 4th Generation wRfx: NONREACTIVE

## 2021-11-03 MED ORDER — INSULIN ASPART 100 UNIT/ML IJ SOLN
0.0000 [IU] | Freq: Three times a day (TID) | INTRAMUSCULAR | Status: DC
  Administered 2021-11-03 – 2021-11-04 (×4): 1 [IU] via SUBCUTANEOUS
  Filled 2021-11-03: qty 1

## 2021-11-03 MED ORDER — HYDROCODONE-ACETAMINOPHEN 5-325 MG PO TABS
1.0000 | ORAL_TABLET | Freq: Four times a day (QID) | ORAL | Status: DC | PRN
  Administered 2021-11-05 – 2021-11-07 (×3): 2 via ORAL
  Administered 2021-11-07: 1 via ORAL
  Administered 2021-11-09: 2 via ORAL
  Filled 2021-11-03: qty 2
  Filled 2021-11-03: qty 1
  Filled 2021-11-03 (×3): qty 2

## 2021-11-03 MED ORDER — LISINOPRIL 10 MG PO TABS
10.0000 mg | ORAL_TABLET | Freq: Every day | ORAL | Status: DC
  Administered 2021-11-04 – 2021-11-07 (×4): 10 mg via ORAL
  Filled 2021-11-03 (×4): qty 1

## 2021-11-03 MED ORDER — HYDRALAZINE HCL 20 MG/ML IJ SOLN
10.0000 mg | INTRAMUSCULAR | Status: DC | PRN
  Administered 2021-11-07 – 2021-11-09 (×2): 10 mg via INTRAVENOUS
  Filled 2021-11-03 (×2): qty 1

## 2021-11-03 MED ORDER — MORPHINE SULFATE (PF) 2 MG/ML IV SOLN
0.5000 mg | INTRAVENOUS | Status: DC | PRN
  Administered 2021-11-06: 0.5 mg via INTRAVENOUS
  Filled 2021-11-03: qty 1

## 2021-11-03 MED ORDER — ENOXAPARIN SODIUM 40 MG/0.4ML IJ SOSY
40.0000 mg | PREFILLED_SYRINGE | INTRAMUSCULAR | Status: DC
  Administered 2021-11-03 – 2021-11-11 (×7): 40 mg via SUBCUTANEOUS
  Filled 2021-11-03 (×7): qty 0.4

## 2021-11-03 MED ORDER — FENTANYL CITRATE PF 50 MCG/ML IJ SOSY
50.0000 ug | PREFILLED_SYRINGE | Freq: Once | INTRAMUSCULAR | Status: AC
  Administered 2021-11-03: 50 ug via INTRAVENOUS
  Filled 2021-11-03: qty 1

## 2021-11-03 MED ORDER — SENNA 8.6 MG PO TABS
1.0000 | ORAL_TABLET | Freq: Two times a day (BID) | ORAL | Status: DC
Start: 2021-11-03 — End: 2021-11-11
  Administered 2021-11-03 – 2021-11-11 (×13): 8.6 mg via ORAL
  Filled 2021-11-03 (×17): qty 1

## 2021-11-03 NOTE — Plan of Care (Signed)

## 2021-11-03 NOTE — ED Provider Notes (Signed)
[facility] EMERGENCY DEPARTMENT Provider Note   CSN: [id number] Arrival date & time: 11/03/21  1314     History {Add pertinent medical, surgical, social history, OB history to HPI:1} Chief Complaint  Patient presents with   Monica Ayers is a 61 y.o. female.  Patient with history of vascular dementia, type 2 diabetes, and hypertension presents today from [facility] nursing home for fall.  Patient is unfortunately nonverbal and does not appear to understand speech either, therefore history provided exclusively by staff at nursing home.  According to staff, patient had an unwitnessed fall around 10 PM last night and was found down on the ground.  She was subsequently assisted to her bed and went to sleep last night, however this morning staff noted that she was unable to walk and sent her here for further evaluation of same.  Staff states that the patient normally walks and even skips throughout the facility without any assistance. She is not on anticoagulation.  Level 5 caveat --- patient is nonverbal  The history is provided by the patient. No language interpreter was used.  Fall       Home Medications Prior to Admission medications   Medication Sig Start Date End Date Taking? Authorizing Provider  acetaminophen (TYLENOL) 325 MG tablet Take 2 tablets (650 mg total) by mouth every 6 (six) hours as needed for mild pain, fever or headache (or Fever >/= 101). 12/30/18   Shon Hale, MD  amLODipine (NORVASC) 5 MG tablet Take 1 tablet (5 mg total) by mouth daily. for blood pressure 05/26/19   Freddy Finner, NP  aspirin 325 MG EC tablet Take 1 tablet (325 mg total) by mouth daily with breakfast. Patient taking differently: Take 325 mg by mouth every morning.  12/30/18   Shon Hale, MD  atorvastatin (LIPITOR) 40 MG tablet Take 1 tablet (40 mg total) by mouth daily at 6 PM. 05/24/19 06/23/19  Freddy Finner, NP  lisinopril (ZESTRIL) 10 MG tablet Take 1 tablet (10 mg  total) by mouth daily. For BP 01/24/19   Freddy Finner, NP  metFORMIN (GLUCOPHAGE) 500 MG tablet Take 1 tablet (500 mg total) by mouth daily. Patient taking differently: Take 500 mg by mouth every morning.  01/10/19 01/10/20  Freddy Finner, NP  potassium chloride (KLOR-CON) 10 MEQ tablet Take 1 tablet (10 mEq total) by mouth daily. Patient taking differently: Take 10 mEq by mouth every morning.  04/24/19   Freddy Finner, NP  propranolol (INDERAL) 20 MG tablet TAKE ONE TABLET BY MOUTH TWICE DAILY 05/29/19   Roma Kayser, MD      Allergies    Latex    Review of Systems   Review of Systems  Unable to perform ROS: Patient nonverbal  All other systems reviewed and are negative.   Physical Exam Updated Vital Signs BP (!) 160/96 (BP Location: Left Arm)   Pulse 78   Temp 100 F (37.8 C) (Oral)   Resp 20   Ht 5\' 7"  (1.702 m)   Wt 72.6 kg   SpO2 97%   BMI 25.06 kg/m  Physical Exam Vitals and nursing note reviewed.  Constitutional:      General: She is not in acute distress.    Appearance: Normal appearance. She is normal weight. She is not ill-appearing, toxic-appearing or diaphoretic.  HENT:     Head: Normocephalic and atraumatic.     Comments: Small superficial laceration noted to the philtrum of the  face. No oral trauma, crepitus, or other abnormalities noted Eyes:     Extraocular Movements: Extraocular movements intact.     Pupils: Pupils are equal, round, and reactive to light.  Cardiovascular:     Rate and Rhythm: Normal rate and regular rhythm.     Heart sounds: Normal heart sounds.  Pulmonary:     Effort: Pulmonary effort is normal. No respiratory distress.     Breath sounds: Normal breath sounds.  Abdominal:     General: Abdomen is flat.     Palpations: Abdomen is soft.  Musculoskeletal:        General: Normal range of motion.     Cervical back: Normal range of motion and neck supple.     Comments: External rotation and shortening of the right leg. DP and  PT pulses intact and 2+.  Skin:    General: Skin is warm and dry.  Neurological:     General: No focal deficit present.     Mental Status: She is alert.  Psychiatric:        Mood and Affect: Mood normal.        Behavior: Behavior normal.     ED Results / Procedures / Treatments   Labs (all labs ordered are listed, but only abnormal results are displayed) Labs Reviewed  CBC WITH DIFFERENTIAL/PLATELET - Abnormal; Notable for the following components:      Result Value   WBC 12.0 (*)    Neutro Abs 9.8 (*)    Monocytes Absolute 1.2 (*)    All other components within normal limits  BASIC METABOLIC PANEL - Abnormal; Notable for the following components:   Glucose, Bld 219 (*)    Calcium 8.7 (*)    All other components within normal limits    EKG None  Radiology DG Chest 1 View  Result Date: 11/03/2021 CLINICAL DATA:  Larey Seat last night. EXAM: CHEST  1 VIEW COMPARISON:  Chest x-ray dated May 18, 2019. FINDINGS: The heart size and mediastinal contours are within normal limits. Normal pulmonary vascularity. No focal consolidation, pleural effusion, or pneumothorax. No acute osseous abnormality. Prior left distal clavicle resection again noted. IMPRESSION: 1. No active disease. Electronically Signed   By: Obie Dredge M.D.   On: 11/03/2021 14:26   DG Femur Min 2 Views Right  Result Date: 11/03/2021 CLINICAL DATA:  Fall, right leg pain EXAM: PELVIS - 1-2 VIEW; RIGHT FEMUR 2 VIEWS COMPARISON:  None Available. FINDINGS: Acute right femoral neck fracture with superior displacement and varus angulation at the fracture site. Hip joint alignment is maintained without dislocation. Pelvic bony ring intact. Distal right femur intact. No appreciable soft tissue abnormality. IMPRESSION: Acute right femoral neck fracture with superior displacement and varus angulation at the fracture site. Electronically Signed   By: Duanne Guess D.O.   On: 11/03/2021 14:19   DG Pelvis 1-2 Views  Result  Date: 11/03/2021 CLINICAL DATA:  Fall, right leg pain EXAM: PELVIS - 1-2 VIEW; RIGHT FEMUR 2 VIEWS COMPARISON:  None Available. FINDINGS: Acute right femoral neck fracture with superior displacement and varus angulation at the fracture site. Hip joint alignment is maintained without dislocation. Pelvic bony ring intact. Distal right femur intact. No appreciable soft tissue abnormality. IMPRESSION: Acute right femoral neck fracture with superior displacement and varus angulation at the fracture site. Electronically Signed   By: Duanne Guess D.O.   On: 11/03/2021 14:19    Procedures Procedures  {Document cardiac monitor, telemetry assessment procedure when appropriate:1}  Medications Ordered  in ED Medications  fentaNYL (SUBLIMAZE) injection 50 mcg (50 mcg Intravenous Given 11/03/21 1458)    ED Course/ Medical Decision Making/ A&P                           Medical Decision Making Amount and/or Complexity of Data Reviewed Labs: ordered. Radiology: ordered.  Risk Prescription drug management.   This patient presents to the ED for concern of fall, this involves an extensive number of treatment options, and is a complaint that carries with it a high risk of complications and morbidity.   Co morbidities that complicate the patient evaluation  Patient is nonverbal   Additional history obtained:  Additional history obtained from previous ER notes. Also discussed patient with nursing home to get story of events that occurred that resulted in arrival  Lab Tests:  I Ordered, and personally interpreted labs.  The pertinent results include:  WBC 12, likely reactive. Glucose 219. No other acute laboratory findings   Imaging Studies ordered:  I ordered imaging studies including DG right femur, pelvis, and chest  I independently visualized and interpreted imaging which showed  Acute right femoral neck fracture with superior displacement and varus angulation at the fracture site. CXR  without evidence of active cardiopulmonary disease I agree with the radiologist interpretation   Consultations Obtained:  I requested consultation with the orthopedics on call Dr. Lucia Gaskins,  and discussed lab and imaging findings as well as pertinent plan - they recommend: admit to hospitalist, they will see the patient   Problem List / ED Course / Critical interventions / Medication management  I ordered medication including fentanyl  for pain  Reevaluation of the patient after these medicines showed that the patient stayed the same I have reviewed the patients home medicines and have made adjustments as needed   Social Determinants of Health:  Patient is nonverbal from nursing home   Test / Admission - Considered:  Patient presents today from nursing home with complaints of fall. Right femoral neck fracture seen on x-ray imaging. She is not anticoagulated. Discussed patient with ortho who will see patient, admit to medicine.  Discussed patient with hospitalist who agrees to admit.  Findings and plan of care discussed with supervising physician Dr. Doren Custard who is in agreement.    Final Clinical Impression(s) / ED Diagnoses Final diagnoses:  Fall, initial encounter  Closed right hip fracture, initial encounter [facility])    Rx / DC Orders ED Discharge Orders     None

## 2021-11-03 NOTE — ED Triage Notes (Signed)
Patient via EMS from [facility] due to fall the previous night. Abrasion noted to upper lip and small bruise noted to right upper leg. The leg does appear to have some shortening and outward rotation but patient shows no signs of distress and is non verbal.

## 2021-11-03 NOTE — H&P (Signed)
History and Physical    Patient: Monica Ayers:[id number] DOB: 05/08/1960 DOA: 11/03/2021 DOS: the patient was seen and examined on 11/03/2021 PCP: Anabel Halon, MD  Patient coming from: [facility] via EMS  Chief Complaint:  Chief Complaint  Patient presents with   Fall   HPI: Monica Ayers is a 61 y.o. female with medical history significant of hypertension, diabetes mellitus type 2, hyperthyroidism, and vascular dementia who is nonverbal at baseline who presents after having an unwitnessed fall last night around 10 PM.  History is obtained from review of records as it is documented that patient does not appear to understand spoken words.  She was assisted back to her bed after the fall and noted to be unable to walk this morning by staff.  It was reported that the patient normally ambulates without assistance.  On admission to the emergency department patient was noted to have a temperature of 100 F with blood pressure 157/74 to 164/85, and all other vital signs maintained.  Labs significant for WBC 12, calcium 8.7, and glucose 219.  X-rays revealed acute right femoral neck fracture with superior displacement and varus angulation at the fracture site.  Dr. Susa Simmonds of orthopedics was consulted.  Patient had been given fentanyl 50 mcg IV.  Review of Systems: unable to review all systems due to the inability of the patient to answer questions. Past Medical History:  Diagnosis Date   Allergic state 10/02/2016   Allergy    Dementia (HCC)    Diabetes mellitus without complication (HCC)    Hypertension    Hyperthyroidism    Lactic acidosis 11/17/2018   Pressure injury of skin 11/18/2018   Sepsis (HCC) 12/28/2018   Sepsis due to undetermined organism (HCC) 11/17/2018   Past Surgical History:  Procedure Laterality Date   FRACTURE SURGERY     KNEE SURGERY Right    NECK SURGERY     SHOULDER SURGERY Left    Social History:  reports that she has quit smoking. Her smoking use  included cigarettes. She has never used smokeless tobacco. She reports that she does not drink alcohol and does not use drugs.  Allergies  Allergen Reactions   Latex Rash    History reviewed. No pertinent family history.  Prior to Admission medications   Medication Sig Start Date End Date Taking? Authorizing Provider  acetaminophen (TYLENOL) 325 MG tablet Take 2 tablets (650 mg total) by mouth every 6 (six) hours as needed for mild pain, fever or headache (or Fever >/= 101). 12/30/18   Shon Hale, MD  amLODipine (NORVASC) 5 MG tablet Take 1 tablet (5 mg total) by mouth daily. for blood pressure 05/26/19   Freddy Finner, NP  aspirin 325 MG EC tablet Take 1 tablet (325 mg total) by mouth daily with breakfast. Patient taking differently: Take 325 mg by mouth every morning.  12/30/18   Shon Hale, MD  atorvastatin (LIPITOR) 40 MG tablet Take 1 tablet (40 mg total) by mouth daily at 6 PM. 05/24/19 06/23/19  Freddy Finner, NP  lisinopril (ZESTRIL) 10 MG tablet Take 1 tablet (10 mg total) by mouth daily. For BP 01/24/19   Freddy Finner, NP  metFORMIN (GLUCOPHAGE) 500 MG tablet Take 1 tablet (500 mg total) by mouth daily. Patient taking differently: Take 500 mg by mouth every morning.  01/10/19 01/10/20  Freddy Finner, NP  potassium chloride (KLOR-CON) 10 MEQ tablet Take 1 tablet (10 mEq total) by mouth daily. Patient taking differently:  Take 10 mEq by mouth every morning.  04/24/19   Freddy Finner, NP  propranolol (INDERAL) 20 MG tablet TAKE ONE TABLET BY MOUTH TWICE DAILY 05/29/19   Roma Kayser, MD    Physical Exam: Vitals:   11/03/21 1333 11/03/21 1334 11/03/21 1430 11/03/21 1530  BP: (!) 160/96  (!) 164/85 (!) 157/74  Pulse: 78  79 73  Resp: 20  18 17   Temp: 100 F (37.8 C)     TempSrc: Oral     SpO2: 97%  98% 95%  Weight:  72.6 kg    Height:  5\' 7"  (1.702 m)      Constitutional: Older female appears to be in no acute distress but not able to follow  commands Eyes: PERRL, lids and conjunctivae normal ENMT: Mucous membranes are moist.  Currently chewing on something.  Superficial laceration of the upper lip  neck: normal, supple  Respiratory: clear to auscultation bilaterally, no wheezing, no crackles. Normal respiratory effort.  .  Cardiovascular: Regular rate and rhythm, no murmurs / rubs / gallops. No extremity edema.  Pulses intact. Abdomen: no tenderness, no masses palpated.   Bowel sounds positive.  Musculoskeletal: no clubbing / cyanosis.  External rotation with shortening of the right leg. Neurologic: CN 2-12 grossly intact. Sensation intact, DTR normal. Strength 5/5 in all 4.  Psychiatric: Normal judgment and insight. Alert and oriented x 3. Normal mood.   Data Reviewed:  Sinus tachycardia 101 bpm with QTc 487.  Assessment and Plan: Right femoral neck fracture secondary to fall Patient presents after having unwitnessed fall found to have a right femoral neck fracture.  Dr. orthopedics consulted. -Admit to a monitored bed -Hip fracture order set utilized -N.p.o. after midnight -Hydrocodone/morphine as needed for pain -Appreciate orthopedic consultative services, we will follow-up for any further recommendations  Essential hypertension On admission blood pressure elevated up to 171/87.  Only medication that appears to be recently prescribed was lisinopril 10 mg daily. -Continue lisinopril -Hydralazine IV needed for elevated blood pressures  Diabetes mellitus type 2, well controlled without long-term use of insulin On admission glucose elevated at 219.  Last available hemoglobin A1c from 2021 was 5.5.  Home medication regimen includes metformin.  Suspect hyperglycemia likely related to acute stress response. -Hypoglycemic protocols -Hold metformin -CBGs before every meal with very sensitive SSI -Adjust insulin regimen as needed  Vascular dementia/frontal lobe dementia Patient reported being nonverbal at  baseline.     DVT prophylaxis: Lovenox Advance Care Planning:   Code Status: Full Code   Consults: Orthopedic  Family Communication: Message left over the phone  Severity of Illness: The appropriate patient status for this patient is INPATIENT. Inpatient status is judged to be reasonable and necessary in order to provide the required intensity of service to ensure the patient's safety. The patient's presenting symptoms, physical exam findings, and initial radiographic and laboratory data in the context of their chronic comorbidities is felt to place them at high risk for further clinical deterioration. Furthermore, it is not anticipated that the patient will be medically stable for discharge from the hospital within 2 midnights of admission.   * I certify that at the point of admission it is my clinical judgment that the patient will require inpatient hospital care spanning beyond 2 midnights from the point of admission due to high intensity of service, high risk for further deterioration and high frequency of surveillance required.*  Author: Susa Simmonds, MD 11/03/2021 4:40 PM  For on call review  [email or web address]

## 2021-11-03 NOTE — ED Notes (Signed)
DSS worker in ED at this time and states that he will not be available for consent/consult tomorrow and the hospital staff will need to contact his supervisor Nilda Calamity at [phone].

## 2021-11-03 NOTE — ED Notes (Signed)
Legal guardian stated that patient is completely non verbal.

## 2021-11-04 DIAGNOSIS — S72001A Fracture of unspecified part of neck of right femur, initial encounter for closed fracture: Secondary | ICD-10-CM | POA: Diagnosis not present

## 2021-11-04 LAB — T4, FREE: Free T4: 1.21 ng/dL — ABNORMAL HIGH (ref 0.61–1.12)

## 2021-11-04 LAB — BASIC METABOLIC PANEL
Anion gap: 8 (ref 5–15)
BUN: 14 mg/dL (ref 8–23)
CO2: 24 mmol/L (ref 22–32)
Calcium: 8.9 mg/dL (ref 8.9–10.3)
Chloride: 106 mmol/L (ref 98–111)
Creatinine, Ser: 0.6 mg/dL (ref 0.44–1.00)
GFR, Estimated: >60 mL/min (ref >60)
Glucose, Bld: 166 mg/dL — ABNORMAL HIGH (ref 70–99)
Potassium: 3.6 mmol/L (ref 3.5–5.1)
Sodium: 138 mmol/L (ref 135–145)

## 2021-11-04 LAB — CBC
HCT: 40.9 % (ref 36.0–46.0)
Hemoglobin: 13.8 g/dL (ref 12.0–15.0)
MCH: 29.6 pg (ref 26.0–34.0)
MCHC: 33.7 g/dL (ref 30.0–36.0)
MCV: 87.8 fL (ref 80.0–100.0)
Platelets: 265 10*3/uL (ref 150–400)
RBC: 4.66 MIL/uL (ref 3.87–5.11)
RDW: 14 % (ref 11.5–15.5)
WBC: 10 10*3/uL (ref 4.0–10.5)
nRBC: 0 % (ref 0.0–0.2)

## 2021-11-04 LAB — GLUCOSE, CAPILLARY
Glucose-Capillary: 165 mg/dL — ABNORMAL HIGH (ref 70–99)
Glucose-Capillary: 176 mg/dL — ABNORMAL HIGH (ref 70–99)
Glucose-Capillary: 187 mg/dL — ABNORMAL HIGH (ref 70–99)
Glucose-Capillary: 163 mg/dL — ABNORMAL HIGH (ref 70–99)

## 2021-11-04 LAB — TSH: TSH: 0.171 u[IU]/mL — ABNORMAL LOW (ref 0.350–4.500)

## 2021-11-04 MED ORDER — AMLODIPINE BESYLATE 5 MG PO TABS
5.0000 mg | ORAL_TABLET | Freq: Every day | ORAL | Status: DC
  Administered 2021-11-04 – 2021-11-07 (×4): 5 mg via ORAL
  Filled 2021-11-04 (×4): qty 1

## 2021-11-04 MED ORDER — PROPRANOLOL HCL 20 MG PO TABS
20.0000 mg | ORAL_TABLET | Freq: Two times a day (BID) | ORAL | Status: DC
  Administered 2021-11-04 – 2021-11-08 (×9): 20 mg via ORAL
  Filled 2021-11-04 (×9): qty 1

## 2021-11-04 MED ORDER — LACTATED RINGERS IV SOLN: INTRAVENOUS | Status: DC

## 2021-11-04 NOTE — Plan of Care (Signed)

## 2021-11-04 NOTE — TOC Progression Note (Signed)
  Transition of Care [facility]) Screening Note   Patient Details  Name: EMMETT ARNTZ Date of Birth: 02-08-1961   Transition of Care [facility]) CM/SW Contact:    Leitha Bleak, RN Phone Number: 11/04/2021, 8:19 AM    Transition of Care Department [facility]) has reviewed patient and no TOC needs have been identified at this time. We will continue to monitor patient advancement through interdisciplinary progression rounds. If new patient transition needs arise, please place a TOC consult.     Expected Discharge Plan: Skilled Nursing Facility Barriers to Discharge: Continued Medical Work up  Expected Discharge Plan and Services Expected Discharge Plan: Skilled Nursing Facility     Post Acute Care Choice: Skilled Nursing Facility Living arrangements for the past 2 months: Skilled Nursing Facility

## 2021-11-04 NOTE — Progress Notes (Signed)
Report called to [facility] and [facility] picked up patient for transport.

## 2021-11-04 NOTE — Progress Notes (Signed)
PROGRESS NOTE    Monica Ayers  TZG:[id number] DOB: 11-May-1960 DOA: 11/03/2021 PCP: Anabel Halon, MD   Brief Narrative:    Monica Ayers is a 61 y.o. female with medical history significant of hypertension, diabetes mellitus type 2, hyperthyroidism, and vascular dementia who is nonverbal at baseline who presents after having an unwitnessed fall last night around 10 PM.  She has been noted to have a right femoral neck fracture.  She will need transfer to [facility] for further orthopedic evaluation and treatment.  Assessment & Plan:   Principal Problem:   Closed fracture of right hip (HCC) Active Problems:   Essential hypertension   Type 2 diabetes mellitus without complication, without long-term current use of insulin (HCC)   Frontal lobe dementia (HCC)  Assessment and Plan:   Right femoral neck fracture secondary to fall Patient presents after having unwitnessed fall found to have a right femoral neck fracture.  Dr. Susa Simmonds orthopedics consulted overnight and spoke with Dr. Jena Gauss on 7/4 regarding need for transfer to [facility] for treatment. -Pain management as ordered -Keep n.p.o. and on IV fluid -Transfer to [facility] today   Essential hypertension-controlled -Continue lisinopril, amlodipine, and propranolol -Hydralazine IV needed for elevated blood pressures   Diabetes mellitus type 2, well controlled without long-term use of insulin On admission glucose elevated at 219.  Last available hemoglobin A1c from 2021 was 5.5.  Home medication regimen includes metformin.  Suspect hyperglycemia likely related to acute stress response. -Hypoglycemic protocols -Hold metformin -CBGs before every meal with very sensitive SSI -Adjust insulin regimen as needed -A1c 6.1%   Vascular dementia/frontal lobe dementia Noted to be nonverbal at baseline  Low TSH Check free T4 level     DVT prophylaxis: Lovenox Code Status: Full Family Communication: None at bedside, tried  calling husband with no response 7/4 Disposition Plan:  Status is: Inpatient Remains inpatient appropriate because: Need for IV medications and inpatient procedure.  Consultants:  Orthopedics  Procedures:  None  Antimicrobials:  None   Subjective: Patient seen and evaluated today and appears comfortable.  She is nonverbal.  No acute overnight events noted.  Objective: Vitals:   11/03/21 1939 11/03/21 2351 11/04/21 0406 11/04/21 0748  BP: (!) 156/76 (!) 156/84 (!) 151/88 (!) 157/91  Pulse: 90 87 87 86  Resp: 20 20 18    Temp: 98.4 F (36.9 C) 99.5 F (37.5 C) 98.1 F (36.7 C) 99.8 F (37.7 C)  TempSrc:    Oral  SpO2: 98% 97% 98% 96%  Weight: 64.5 kg     Height:        Intake/Output Summary (Last 24 hours) at 11/04/2021 1004 Last data filed at 11/04/2021 0800 Gross per 24 hour  Intake --  Output 350 ml  Net -350 ml   Filed Weights   11/03/21 1334 11/03/21 1939  Weight: 72.6 kg 64.5 kg    Examination:  General exam: Appears calm and comfortable, alert and nonverbal Respiratory system: Clear to auscultation. Respiratory effort normal. Cardiovascular system: S1 & S2 heard, RRR.  Gastrointestinal system: Abdomen is soft Central nervous system: Alert and awake Extremities: No edema Skin: No significant lesions noted Psychiatry: Flat affect.    Data Reviewed: I have personally reviewed following labs and imaging studies  CBC: Recent Labs  Lab 11/03/21 1431 11/04/21 0501  WBC 12.0* 10.0  NEUTROABS 9.8*  --   HGB 13.8 13.8  HCT 40.4 40.9  MCV 86.1 87.8  PLT 235 265   Basic Metabolic  Panel: Recent Labs  Lab 11/03/21 1431 11/04/21 0501  NA 137 138  K 3.6 3.6  CL 106 106  CO2 23 24  GLUCOSE 219* 166*  BUN 11 14  CREATININE 0.56 0.60  CALCIUM 8.7* 8.9   GFR: Estimated Creatinine Clearance: 71.8 mL/min (by C-G formula based on SCr of 0.6 mg/dL). Liver Function Tests: No results for input(s): "AST", "ALT", "ALKPHOS", "BILITOT", "PROT", "ALBUMIN"  in the last 168 hours. No results for input(s): "LIPASE", "AMYLASE" in the last 168 hours. No results for input(s): "AMMONIA" in the last 168 hours. Coagulation Profile: No results for input(s): "INR", "PROTIME" in the last 168 hours. Cardiac Enzymes: No results for input(s): "CKTOTAL", "CKMB", "CKMBINDEX", "TROPONINI" in the last 168 hours. BNP (last 3 results) No results for input(s): "PROBNP" in the last 8760 hours. HbA1C: Recent Labs    11/03/21 1715  HGBA1C 6.1*   CBG: Recent Labs  Lab 11/03/21 1731 11/03/21 2109 11/04/21 0751  GLUCAP 187* 177* 165*   Lipid Profile: No results for input(s): "CHOL", "HDL", "LDLCALC", "TRIG", "CHOLHDL", "LDLDIRECT" in the last 72 hours. Thyroid Function Tests: Recent Labs    11/04/21 0501  TSH 0.171*   Anemia Panel: No results for input(s): "VITAMINB12", "FOLATE", "FERRITIN", "TIBC", "IRON", "RETICCTPCT" in the last 72 hours. Sepsis Labs: No results for input(s): "PROCALCITON", "LATICACIDVEN" in the last 168 hours.  No results found for this or any previous visit (from the past 240 hour(s)).       Radiology Studies: DG Chest 1 View  Result Date: 11/03/2021 CLINICAL DATA:  Larey Seat last night. EXAM: CHEST  1 VIEW COMPARISON:  Chest x-ray dated May 18, 2019. FINDINGS: The heart size and mediastinal contours are within normal limits. Normal pulmonary vascularity. No focal consolidation, pleural effusion, or pneumothorax. No acute osseous abnormality. Prior left distal clavicle resection again noted. IMPRESSION: 1. No active disease. Electronically Signed   By: Obie Dredge M.D.   On: 11/03/2021 14:26   DG Femur Min 2 Views Right  Result Date: 11/03/2021 CLINICAL DATA:  Fall, right leg pain EXAM: PELVIS - 1-2 VIEW; RIGHT FEMUR 2 VIEWS COMPARISON:  None Available. FINDINGS: Acute right femoral neck fracture with superior displacement and varus angulation at the fracture site. Hip joint alignment is maintained without dislocation.  Pelvic bony ring intact. Distal right femur intact. No appreciable soft tissue abnormality. IMPRESSION: Acute right femoral neck fracture with superior displacement and varus angulation at the fracture site. Electronically Signed   By: Duanne Guess D.O.   On: 11/03/2021 14:19   DG Pelvis 1-2 Views  Result Date: 11/03/2021 CLINICAL DATA:  Fall, right leg pain EXAM: PELVIS - 1-2 VIEW; RIGHT FEMUR 2 VIEWS COMPARISON:  None Available. FINDINGS: Acute right femoral neck fracture with superior displacement and varus angulation at the fracture site. Hip joint alignment is maintained without dislocation. Pelvic bony ring intact. Distal right femur intact. No appreciable soft tissue abnormality. IMPRESSION: Acute right femoral neck fracture with superior displacement and varus angulation at the fracture site. Electronically Signed   By: Duanne Guess D.O.   On: 11/03/2021 14:19        Scheduled Meds:  amLODipine  5 mg Oral Daily   enoxaparin (LOVENOX) injection  40 mg Subcutaneous Q24H   insulin aspart  0-6 Units Subcutaneous TID WC   lisinopril  10 mg Oral Daily   propranolol  20 mg Oral BID   senna  1 tablet Oral BID   Continuous Infusions:  lactated ringers  LOS: 1 day    Time spent: 35 minutes    Hamdi Vari Hoover Brunette, DO [facility]  If 7PM-7AM, please contact night-coverage [email or web address] 11/04/2021, 10:04 AM

## 2021-11-05 ENCOUNTER — Other Ambulatory Visit: Payer: Self-pay

## 2021-11-05 ENCOUNTER — Encounter (HOSPITAL_COMMUNITY)
Admission: EM | Disposition: A | Discharge status: Skilled Nursing Facility | Payer: Self-pay | Source: Home / Self Care | Attending: Internal Medicine

## 2021-11-05 ENCOUNTER — Inpatient Hospital Stay (HOSPITAL_COMMUNITY): Payer: Medicare Other | Admitting: Anesthesiology

## 2021-11-05 ENCOUNTER — Inpatient Hospital Stay (HOSPITAL_COMMUNITY): Payer: Medicare Other

## 2021-11-05 ENCOUNTER — Encounter (HOSPITAL_COMMUNITY): Payer: Self-pay | Admitting: Internal Medicine

## 2021-11-05 DIAGNOSIS — S72001A Fracture of unspecified part of neck of right femur, initial encounter for closed fracture: Secondary | ICD-10-CM

## 2021-11-05 DIAGNOSIS — S72001P Fracture of unspecified part of neck of right femur, subsequent encounter for closed fracture with malunion: Secondary | ICD-10-CM | POA: Diagnosis not present

## 2021-11-05 DIAGNOSIS — I10 Essential (primary) hypertension: Secondary | ICD-10-CM

## 2021-11-05 DIAGNOSIS — W19XXXA Unspecified fall, initial encounter: Secondary | ICD-10-CM | POA: Diagnosis not present

## 2021-11-05 DIAGNOSIS — Z87891 Personal history of nicotine dependence: Secondary | ICD-10-CM | POA: Diagnosis not present

## 2021-11-05 DIAGNOSIS — E119 Type 2 diabetes mellitus without complications: Secondary | ICD-10-CM

## 2021-11-05 DIAGNOSIS — R7989 Other specified abnormal findings of blood chemistry: Secondary | ICD-10-CM

## 2021-11-05 DIAGNOSIS — G3109 Other frontotemporal dementia: Secondary | ICD-10-CM | POA: Diagnosis not present

## 2021-11-05 HISTORY — PX: HIP ARTHROPLASTY: SHX981

## 2021-11-05 LAB — MAGNESIUM: Magnesium: 2.2 mg/dL (ref 1.7–2.4)

## 2021-11-05 LAB — BASIC METABOLIC PANEL
Anion gap: 10 (ref 5–15)
BUN: 15 mg/dL (ref 8–23)
CO2: 26 mmol/L (ref 22–32)
Calcium: 8.9 mg/dL (ref 8.9–10.3)
Chloride: 102 mmol/L (ref 98–111)
Creatinine, Ser: 0.8 mg/dL (ref 0.44–1.00)
GFR, Estimated: >60 mL/min (ref >60)
Glucose, Bld: 162 mg/dL — ABNORMAL HIGH (ref 70–99)
Potassium: 4.1 mmol/L (ref 3.5–5.1)
Sodium: 138 mmol/L (ref 135–145)

## 2021-11-05 LAB — CBC
HCT: 41.5 % (ref 36.0–46.0)
Hemoglobin: 14 g/dL (ref 12.0–15.0)
MCH: 29.4 pg (ref 26.0–34.0)
MCHC: 33.7 g/dL (ref 30.0–36.0)
MCV: 87 fL (ref 80.0–100.0)
Platelets: 266 10*3/uL (ref 150–400)
RBC: 4.77 MIL/uL (ref 3.87–5.11)
RDW: 14 % (ref 11.5–15.5)
WBC: 9 10*3/uL (ref 4.0–10.5)
nRBC: 0 % (ref 0.0–0.2)

## 2021-11-05 LAB — GLUCOSE, CAPILLARY
Glucose-Capillary: 164 mg/dL — ABNORMAL HIGH (ref 70–99)
Glucose-Capillary: 171 mg/dL — ABNORMAL HIGH (ref 70–99)
Glucose-Capillary: 137 mg/dL — ABNORMAL HIGH (ref 70–99)
Glucose-Capillary: 133 mg/dL — ABNORMAL HIGH (ref 70–99)
Glucose-Capillary: 165 mg/dL — ABNORMAL HIGH (ref 70–99)
Glucose-Capillary: 186 mg/dL — ABNORMAL HIGH (ref 70–99)

## 2021-11-05 LAB — SURGICAL PCR SCREEN
MRSA, PCR: NEGATIVE
Staphylococcus aureus: NEGATIVE

## 2021-11-05 SURGERY — HEMIARTHROPLASTY, HIP, DIRECT ANTERIOR APPROACH, FOR FRACTURE
Anesthesia: General | Site: Hip | Laterality: Right

## 2021-11-05 MED ORDER — FENTANYL CITRATE (PF) 100 MCG/2ML IJ SOLN: 25.0000 ug | INTRAMUSCULAR | Status: DC | PRN

## 2021-11-05 MED ORDER — DEXAMETHASONE SODIUM PHOSPHATE 10 MG/ML IJ SOLN
INTRAMUSCULAR | Status: DC | PRN
  Administered 2021-11-05: 5 mg via INTRAVENOUS

## 2021-11-05 MED ORDER — LIDOCAINE 2% (20 MG/ML) 5 ML SYRINGE
INTRAMUSCULAR | Status: DC | PRN
  Administered 2021-11-05: 80 mg via INTRAVENOUS

## 2021-11-05 MED ORDER — ACETAMINOPHEN 10 MG/ML IV SOLN: 1000.0000 mg | Freq: Once | INTRAVENOUS | Status: DC | PRN

## 2021-11-05 MED ORDER — SUGAMMADEX SODIUM 200 MG/2ML IV SOLN
INTRAVENOUS | Status: DC | PRN
  Administered 2021-11-05: 200 mg via INTRAVENOUS

## 2021-11-05 MED ORDER — ROCURONIUM BROMIDE 10 MG/ML (PF) SYRINGE
PREFILLED_SYRINGE | INTRAVENOUS | Status: AC
  Filled 2021-11-05: qty 10

## 2021-11-05 MED ORDER — POVIDONE-IODINE 10 % EX SWAB: 2.0000 | Freq: Once | CUTANEOUS | Status: DC

## 2021-11-05 MED ORDER — BUPIVACAINE LIPOSOME 1.3 % IJ SUSP
INTRAMUSCULAR | Status: AC
  Filled 2021-11-05: qty 20

## 2021-11-05 MED ORDER — ONDANSETRON HCL 4 MG/2ML IJ SOLN
INTRAMUSCULAR | Status: DC | PRN
  Administered 2021-11-05: 4 mg via INTRAVENOUS

## 2021-11-05 MED ORDER — PHENYLEPHRINE 80 MCG/ML (10ML) SYRINGE FOR IV PUSH (FOR BLOOD PRESSURE SUPPORT)
PREFILLED_SYRINGE | INTRAVENOUS | Status: DC | PRN
  Administered 2021-11-05: 160 ug via INTRAVENOUS
  Administered 2021-11-05: 80 ug via INTRAVENOUS

## 2021-11-05 MED ORDER — TRANEXAMIC ACID-NACL 1000-0.7 MG/100ML-% IV SOLN
1000.0000 mg | INTRAVENOUS | Status: AC
  Administered 2021-11-05: 1000 mg via INTRAVENOUS
  Filled 2021-11-05: qty 100

## 2021-11-05 MED ORDER — LIDOCAINE 2% (20 MG/ML) 5 ML SYRINGE
INTRAMUSCULAR | Status: AC
  Filled 2021-11-05: qty 5

## 2021-11-05 MED ORDER — ONDANSETRON HCL 4 MG/2ML IJ SOLN
INTRAMUSCULAR | Status: AC
Start: 2021-11-05 — End: ?
  Filled 2021-11-05: qty 2

## 2021-11-05 MED ORDER — SODIUM CHLORIDE 0.9 % IR SOLN
Status: DC | PRN
  Administered 2021-11-05: 3000 mL

## 2021-11-05 MED ORDER — FENTANYL CITRATE (PF) 250 MCG/5ML IJ SOLN
INTRAMUSCULAR | Status: AC
  Filled 2021-11-05: qty 5

## 2021-11-05 MED ORDER — ROCURONIUM BROMIDE 100 MG/10ML IV SOLN
INTRAVENOUS | Status: DC | PRN
  Administered 2021-11-05: 60 mg via INTRAVENOUS

## 2021-11-05 MED ORDER — DEXAMETHASONE SODIUM PHOSPHATE 10 MG/ML IJ SOLN
INTRAMUSCULAR | Status: AC
Start: 2021-11-05 — End: ?
  Filled 2021-11-05: qty 1

## 2021-11-05 MED ORDER — CHLORHEXIDINE GLUCONATE 4 % EX LIQD: 60.0000 mL | Freq: Once | CUTANEOUS | Status: DC

## 2021-11-05 MED ORDER — FENTANYL CITRATE (PF) 100 MCG/2ML IJ SOLN
INTRAMUSCULAR | Status: DC | PRN
  Administered 2021-11-05: 50 ug via INTRAVENOUS
  Administered 2021-11-05: 100 ug via INTRAVENOUS
  Administered 2021-11-05: 25 ug via INTRAVENOUS

## 2021-11-05 MED ORDER — MUPIROCIN 2 % EX OINT
1.0000 | TOPICAL_OINTMENT | Freq: Two times a day (BID) | CUTANEOUS | Status: AC
  Administered 2021-11-05 – 2021-11-09 (×10): 1 via NASAL
  Filled 2021-11-05 (×3): qty 22

## 2021-11-05 MED ORDER — INSULIN ASPART 100 UNIT/ML IJ SOLN
0.0000 [IU] | Freq: Four times a day (QID) | INTRAMUSCULAR | Status: DC
  Administered 2021-11-05 – 2021-11-06 (×4): 1 [IU] via SUBCUTANEOUS

## 2021-11-05 MED ORDER — PROPOFOL 10 MG/ML IV BOLUS
INTRAVENOUS | Status: DC | PRN
  Administered 2021-11-05: 20 mg via INTRAVENOUS
  Administered 2021-11-05: 10 mg via INTRAVENOUS
  Administered 2021-11-05: 150 mg via INTRAVENOUS

## 2021-11-05 MED ORDER — CEFAZOLIN SODIUM-DEXTROSE 2-4 GM/100ML-% IV SOLN
2.0000 g | INTRAVENOUS | Status: AC
  Administered 2021-11-05: 2 g via INTRAVENOUS
  Filled 2021-11-05: qty 100

## 2021-11-05 MED ORDER — 0.9 % SODIUM CHLORIDE (POUR BTL) OPTIME
TOPICAL | Status: DC | PRN
  Administered 2021-11-05: 1000 mL

## 2021-11-05 MED ORDER — PROPOFOL 10 MG/ML IV BOLUS
INTRAVENOUS | Status: AC
  Filled 2021-11-05: qty 20

## 2021-11-05 MED ORDER — SODIUM CHLORIDE (PF) 0.9 % IJ SOLN
INTRAMUSCULAR | Status: DC | PRN
  Administered 2021-11-05: 80 mL via INTRAMUSCULAR

## 2021-11-05 SURGICAL SUPPLY — 61 items
BLADE SAGITTAL 25.0X1.27X90 (BLADE) ×2 IMPLANT
BRUSH FEMORAL CANAL (MISCELLANEOUS) ×1 IMPLANT
CEMENT BONE SIMPLEX SPEEDSET (Cement) ×2 IMPLANT
CEMENT RESTRICTOR BONE PREP ST (Cement) ×1 IMPLANT
CHLORAPREP W/TINT 26 (MISCELLANEOUS) ×4 IMPLANT
COVER SURGICAL LIGHT HANDLE (MISCELLANEOUS) ×2 IMPLANT
DERMABOND ADVANCED (GAUZE/BANDAGES/DRESSINGS) ×2
DERMABOND ADVANCED .7 DNX12 (GAUZE/BANDAGES/DRESSINGS) IMPLANT
DRAPE HALF SHEET 40X57 (DRAPES) ×2 IMPLANT
DRAPE HIP W/POCKET STRL (MISCELLANEOUS) ×2 IMPLANT
DRAPE INCISE IOBAN 66X45 STRL (DRAPES) ×2 IMPLANT
DRAPE INCISE IOBAN 85X60 (DRAPES) ×3 IMPLANT
DRAPE POUCH INSTRU U-SHP 10X18 (DRAPES) ×2 IMPLANT
DRAPE U-SHAPE 47X51 STRL (DRAPES) ×4 IMPLANT
DRESSING AQUACEL AG SP 3.5X10 (GAUZE/BANDAGES/DRESSINGS) IMPLANT
DRSG AQUACEL AG ADV 3.5X10 (GAUZE/BANDAGES/DRESSINGS) ×1 IMPLANT
DRSG AQUACEL AG SP 3.5X10 (GAUZE/BANDAGES/DRESSINGS) ×2
ELECT BLADE 4.0 EZ CLEAN MEGAD (MISCELLANEOUS) ×2
ELECTRODE BLDE 4.0 EZ CLN MEGD (MISCELLANEOUS) ×1 IMPLANT
GLOVE SRG 8 PF TXTR STRL LF DI (GLOVE) ×1 IMPLANT
GLOVE SURG ORTHO 8.0 STRL STRW (GLOVE) ×4 IMPLANT
GLOVE SURG UNDER POLY LF SZ8 (GLOVE) ×1
GOWN STRL REUS W/ TWL LRG LVL3 (GOWN DISPOSABLE) ×2 IMPLANT
GOWN STRL REUS W/ TWL XL LVL3 (GOWN DISPOSABLE) ×1 IMPLANT
GOWN STRL REUS W/TWL LRG LVL3 (GOWN DISPOSABLE) ×2
GOWN STRL REUS W/TWL XL LVL3 (GOWN DISPOSABLE) ×1
HANDPIECE INTERPULSE COAX TIP (DISPOSABLE) ×1
HEAD CERAMIC V40 BIOLOX DEL 28 (Orthopedic Implant) ×1 IMPLANT
HEAD FEM UNIV BIPOLAR 28X46 (Head) ×1 IMPLANT
HOOD PEEL AWAY FLYTE STAYCOOL (MISCELLANEOUS) ×5 IMPLANT
IRRIGATION SURGIPHOR STRL (IV SOLUTION) ×1 IMPLANT
KIT BASIN OR (CUSTOM PROCEDURE TRAY) ×2 IMPLANT
KIT TURNOVER KIT A (KITS) ×2 IMPLANT
MANIFOLD NEPTUNE II (INSTRUMENTS) ×2 IMPLANT
MARKER SKIN DUAL TIP RULER LAB (MISCELLANEOUS) ×3 IMPLANT
NDL 18GX1X1/2 (RX/OR ONLY) (NEEDLE) ×1 IMPLANT
NEEDLE 18GX1X1/2 (RX/OR ONLY) (NEEDLE) ×2 IMPLANT
NS IRRIG 1000ML POUR BTL (IV SOLUTION) ×2 IMPLANT
PACK TOTAL JOINT (CUSTOM PROCEDURE TRAY) ×2 IMPLANT
PRESSURIZER FEMORAL UNIV (MISCELLANEOUS) ×1 IMPLANT
RETRIEVER SUT HEWSON (MISCELLANEOUS) ×2 IMPLANT
SEALER BIPOLAR AQUA 6.0 (INSTRUMENTS) ×2 IMPLANT
SET HNDPC FAN SPRY TIP SCT (DISPOSABLE) ×1 IMPLANT
SPONGE T-LAP 18X18 ~~LOC~~+RFID (SPONGE) ×2 IMPLANT
STEM FEM CMT 43X131X35 SZ3 (Stem) ×1 IMPLANT
SUCTION FRAZIER HANDLE 10FR (MISCELLANEOUS)
SUCTION TUBE FRAZIER 10FR DISP (MISCELLANEOUS) ×1 IMPLANT
SUT BONE WAX W31G (SUTURE) ×2 IMPLANT
SUT ETHIBOND 2 V 37 (SUTURE) ×2 IMPLANT
SUT MNCRL AB 3-0 PS2 18 (SUTURE) ×2 IMPLANT
SUT STRATAFIX 1PDS 45CM VIOLET (SUTURE) ×4 IMPLANT
SUT VIC AB 0 CT1 27 (SUTURE) ×2
SUT VIC AB 0 CT1 27XBRD ANBCTR (SUTURE) ×1 IMPLANT
SUT VIC AB 2-0 CT2 27 (SUTURE) ×6 IMPLANT
SYR 20ML LL LF (SYRINGE) ×2 IMPLANT
SYR 50ML LL SCALE MARK (SYRINGE) ×2 IMPLANT
TOWEL GREEN STERILE (TOWEL DISPOSABLE) ×2 IMPLANT
TOWER CARTRIDGE SMART MIX (DISPOSABLE) ×1 IMPLANT
TRAY FOLEY MTR SLVR 16FR STAT (SET/KITS/TRAYS/PACK) ×1 IMPLANT
TUBE SUCT ARGYLE STRL (TUBING) ×1 IMPLANT
WATER STERILE IRR 1000ML POUR (IV SOLUTION) ×1 IMPLANT

## 2021-11-05 NOTE — Plan of Care (Signed)
  Problem: Pain Managment: Goal: General experience of comfort will improve Outcome: Progressing   Problem: Safety: Goal: Ability to remain free from injury will improve Outcome: Progressing   Problem: Skin Integrity: Goal: Risk for impaired skin integrity will decrease Outcome: Progressing   

## 2021-11-05 NOTE — Op Note (Signed)
11/03/2021 - 11/05/2021  5:17 PM  PATIENT:  Monica Ayers   MRN: [id number]  PRE-OPERATIVE DIAGNOSIS:  Right Hip Fx  POST-OPERATIVE DIAGNOSIS:  Right Hip Fx  PROCEDURE:  Procedure(s): RIGHT HIP HEMI ARTHROPLASTY  PREOPERATIVE INDICATIONS:  Monica Ayers is an 61 y.o. female who was admitted 11/03/2021 with a diagnosis of Closed fracture of right hip ([place]) and elected for surgical management.  The risks benefits and alternatives were discussed with the patient including but not limited to the risks of nonoperative treatment, versus surgical intervention including infection, bleeding, nerve injury, periprosthetic fracture, the need for revision surgery, dislocation, leg length discrepancy, blood clots, cardiopulmonary complications, morbidity, mortality, among others, and they were willing to proceed.  Predicted outcome is good, although there will be at least a six to nine month expected recovery.   OPERATIVE REPORT     SURGEON:  Charlies Constable, MD    ASSISTANT: Nehemiah Massed, PA-C (Present throughout the entire procedure,  necessary for completion of procedure in a timely manner, assisting with retraction, instrumentation, and closure)     ANESTHESIA: General  ESTIMATED BLOOD LOSS: 664 cc    COMPLICATIONS:  None.   UNIQUE ASPECTS OF THE CASE: None     COMPONENTS:  Cemented Stryker Accolade C size 3, 127 degree high offset, 28+0 ceramic ball, 28 x 46 bipolar Hemi head Implant Name Type Inv. Item Serial No. Manufacturer Lot No. LRB No. Used Action  CEMENT RESTRICTOR BONE PREP ST - [id number] Cement CEMENT RESTRICTOR BONE PREP ST  STRYK[employer]UM[employer] [id number] Right 1 Implanted  CEMENT BONE SIMPLEX SPEEDSET - [id number] Cement CEMENT BONE SIMPLEX SPEEDSET  [employer]YK[employer] [id number] Right 1 Implanted  CEMENT BONE SIMPLEX SPEEDSET - [id number] Cement CEMENT BONE SIMPLEX SPEEDSET  STRYKER OR[employer] [id number] Right 1 Implanted  STEM FEM CMT [id number] SZ3 - [id number] Stem STEM FEM CMT  [id number] SZ3  STRYK[employer] [id number] Right 1 Implanted  HEAD FEM UNIV BIPOLAR 28X46 - [id number] Head HEAD FEM UNIV BIPOLAR 28X46  STRYK[employer] [id number] Right 1 Implanted  HEAD CERAMIC V40 BIOLOX DEL 28 - [id number] Orthopedic Implant HEAD CERAMIC V40 BIOLOX DEL 28  [employer] [id number] Right 1 Implanted      PROCEDURE IN DETAIL: The patient was met in the holding area and identified.  The appropriate hip  was marked at the operative site. The patient was then transported to the OR and  placed under anesthesia.  At that point, the patient was  placed in the lateral decubitus position with the operative side up and  secured to the operating room table and all bony prominences padded. A subaxillary role was placed.    The operative lower extremity was prepped from the iliac crest to the ankle.  Sterile draping was performed.  2g of ancef and 1g TXA were given prior to incision. Time out was performed prior to incision.      A routine posterolateral approach was utilized via sharp dissection  carried down to the subcutaneous tissue.  Gross bleeders were Bovie  coagulated.  The iliotibial band was identified and incised  along the length of the skin incision.  A Charnley retractor was inserted with care to protect the sciatic nerve.  With the hip internally rotated, the short external rotators  were identified. The piriformis was tagged with #2 Ethibond, and the hip capsule released in a T-type fashion, and posterior sleeve of the capsule was also tagged.  The femoral neck was exposed, and I resected the femoral neck using  the appropriate jig. This was performed at approximately a thumb's breadth above the lesser trochanter.    I then exposed the deep acetabulum, cleared out any tissue including the ligamentum teres.    I then prepared the proximal femur using the box cutter, Charnley awl, and then sequentially broached.  A trial utilized, and I reduced the hip, leg lengths were assessed  clinically and felt to be equal. The hip was then taken through a full range of motion, the hip was stable at full extension and 90 degrees external rotation without anterior subluxation. The hip was also stable in the position of sleep, and in neutral abduction up to 90 degrees flexion, and 80 degrees IR. The trial components were then removed.   We then prepared canal for cementation.  The cement restrictor was measured and inserted distally.  The canal was then irrigated with the pulse lavage and 3 L of normal saline.  2 bags of Simplex cement were prepared.  Using the cement gun the cement was inserted distally and the canal was filled.  We then pressurized the canal. The real implant was then inserted matching the patient's native anteversion of approximately 25 degrees.  We then waited for 13 minutes for the cement to be fully set.  Excess cement was removed.  A lap was placed in the acetabulum prior to cementing was also removed and the acetabulum was assessed to make sure there was no cement or bone fragments.  The hip was then reduced with the trial head again and taken through functional range of motion and found to have excellent stability. Leg lengths were restored. The real head was then impacted onto the stem and the hip was again reduced.  The capsule was then repaired with #2 Ethibond., and the piriformis was repaired to the abductor tendon. Excellent posterior capsular repair was achieved.   I then irrigated the hip copiously again with pulse lavage. The wounds were injected with 20cc exparal diluted in sterile saline. The fascia and IT band was repaired with #1 stratafix, followed by 0 stratafix for the fat layer followed by 2-0 Vicryl and running 3-0 Monocryl for the skin, Dermabond was applied and an Aquacel dressing was placed.  The patient was then awakened and returned to PACU in stable and satisfactory condition. There were no complications.  Post op recs: WB: WBAT no formal hip  precuations Abx: ancef x23 hours post op Imaging: PACU xrays Dressing: Aquacel dressing to be kept intact until follow-up DVT prophylaxis: lovenox starting POD1 x4 weeks Follow up: 2 weeks after surgery for a wound check with Dr. Zachery Dakins at [facility].  Address: [address] [place], [place], [place] [address]  Office Phone: [phone]   Charlies Constable, MD Orthopedic Surgeon  11/05/2021 5:17 PM

## 2021-11-05 NOTE — Discharge Instructions (Signed)

## 2021-11-05 NOTE — H&P (View-Only) (Signed)
Spoke on the phone with Nilda Calamity, legal guardian, [phone] regarding procedure, risks, and benefits of R hip hemiarthroplasty procedure today. She said we can also speak to the social worker, Chance, involved in her care: [phone]

## 2021-11-05 NOTE — Progress Notes (Signed)
PROGRESS NOTE    Monica Ayers  SEG:[id number] DOB: 06/14/1960 DOA: 11/03/2021 PCP: Anabel Halon, MD    Chief Complaint  Patient presents with   Fall    Brief Narrative:  Monica Ayers is a 61 y.o. female with medical history significant of hypertension, diabetes mellitus type 2, hyperthyroidism, and vascular dementia who is nonverbal at baseline who presents after having an unwitnessed fall last night around 10 PM.  She has been noted to have a right femoral neck fracture.  She will need transfer to [facility] for further orthopedic evaluation and treatment.   Assessment & Plan:   Principal Problem:   Closed fracture of right hip (HCC) Active Problems:   Essential hypertension   Type 2 diabetes mellitus without complication, without long-term current use of insulin (HCC)   Frontal lobe dementia (HCC)  #1 acute right femoral neck fracture secondary to mechanical fall. -Patient presented after unwitnessed fall noted to have a right femoral neck fracture. -Orthopedics consulted and it is noted that her doctor at their consulted overnight spoke with Dr. Jena Gauss on 7/4 regarding need to transfer patient to [facility] for treatment and patient transferred to [facility]. -Patient currently n.p.o. in anticipation of surgical repair today. -Continue pain management, IV fluids, supportive care. -Per orthopedics.  2.  Hypertension -Controlled on current regimen of Norvasc, lisinopril, Inderal.  3.  Diabetes mellitus type 2, well controlled without long-term insulin use, POA -On admission noted to have a glucose at 219. -Hemoglobin A1c 6.1 (11/03/2021). - CBG 164 this morning. -Patient noted to be on metformin at home prior to admission. -It was found hypoglycemia likely related to acute stress response. -Continue to hold metformin. -SSI.  4.  Vascular dementia/frontal lobe dementia -Noted to be nonverbal at baseline. -Outpatient follow-up.  5.  Low TSH  -TSH noted at  0.171. -Free T4 one 1.21. -Outpatient follow-up.   DVT prophylaxis: Lovenox.  Postop DVT prophylaxis per orthopedics Code Status: Full Family Communication: No family at bedside. Disposition: TBD  Status is: Inpatient Remains inpatient appropriate because: Severity of illness   Consultants:  Orthopedics: Dr.Marchwiany 11/05/2021  Procedures:  Chest x-ray 11/03/2021 Plain films of the right femur 11/03/2021 Plain films of the pelvis 11/03/2021  Antimicrobials:  None   Subjective: Nonverbal.  Activity blanket on lab.  Intermittent labs.  Objective: Vitals:   11/04/21 1700 11/04/21 1956 11/05/21 0455 11/05/21 0700  BP:  (!) 146/84 127/61   Pulse:  77 68 67  Resp: (!) 0 19 16   Temp:  98.1 F (36.7 C) 98.8 F (37.1 C) 98 F (36.7 C)  TempSrc:   Axillary Oral  SpO2:  97% 97% 98%  Weight:      Height:        Intake/Output Summary (Last 24 hours) at 11/05/2021 1027 Last data filed at 11/05/2021 0900 Gross per 24 hour  Intake 302.25 ml  Output 900 ml  Net -597.75 ml   Filed Weights   11/03/21 1334 11/03/21 1939  Weight: 72.6 kg 64.5 kg    Examination:  General exam: Appears calm and comfortable  Respiratory system: Clear to auscultation anterior lung fields.  No wheezes, no crackles, no rhonchi. Respiratory effort normal. Cardiovascular system: S1 & S2 heard, RRR. No JVD, murmurs, rubs, gallops or clicks. No pedal edema. Gastrointestinal system: Abdomen is nondistended, soft and nontender. No organomegaly or masses felt. Normal bowel sounds heard. Central nervous system: Alert and oriented. No focal neurological deficits. Extremities: Right leg crossed and  bent.  Skin: No rashes, lesions or ulcers Psychiatry: Judgement and insight appear normal. Mood & affect appropriate.     Data Reviewed: I have personally reviewed following labs and imaging studies  CBC: Recent Labs  Lab 11/03/21 1431 11/04/21 0501 11/05/21 0212  WBC 12.0* 10.0 9.0  NEUTROABS 9.8*  --   --    HGB 13.8 13.8 14.0  HCT 40.4 40.9 41.5  MCV 86.1 87.8 87.0  PLT 235 265 266    Basic Metabolic Panel: Recent Labs  Lab 11/03/21 1431 11/04/21 0501 11/05/21 0212  NA 137 138 138  K 3.6 3.6 4.1  CL 106 106 102  CO2 23 24 26   GLUCOSE 219* 166* 162*  BUN 11 14 15   CREATININE 0.56 0.60 0.80  CALCIUM 8.7* 8.9 8.9  MG  --   --  2.2    GFR: Estimated Creatinine Clearance: 71.8 mL/min (by C-G formula based on SCr of 0.8 mg/dL).  Liver Function Tests: No results for input(s): "AST", "ALT", "ALKPHOS", "BILITOT", "PROT", "ALBUMIN" in the last 168 hours.  CBG: Recent Labs  Lab 11/04/21 0751 11/04/21 1150 11/04/21 1636 11/04/21 2000 11/05/21 0752  GLUCAP 165* 176* 187* 163* 164*     Recent Results (from the past 240 hour(s))  Surgical PCR screen     Status: None   Collection Time: 11/05/21  8:19 AM   Specimen: Nasal Mucosa; Nasal Swab  Result Value Ref Range Status   MRSA, PCR NEGATIVE NEGATIVE Final   Staphylococcus aureus NEGATIVE NEGATIVE Final    Comment: (NOTE) The Xpert SA Assay (FDA approved for NASAL specimens in patients 5 years of age and older), is one component of a comprehensive surveillance program. It is not intended to diagnose infection nor to guide or monitor treatment. Performed at [facility], [address], [place], [address] [place]          Radiology Studies: DG Chest 1 View  Result Date: 11/03/2021 CLINICAL DATA:  32951 last night. EXAM: CHEST  1 VIEW COMPARISON:  Chest x-ray dated May 18, 2019. FINDINGS: The heart size and mediastinal contours are within normal limits. Normal pulmonary vascularity. No focal consolidation, pleural effusion, or pneumothorax. No acute osseous abnormality. Prior left distal clavicle resection again noted. IMPRESSION: 1. No active disease. Electronically Signed   By: Larey Seat M.D.   On: 11/03/2021 14:26   DG Femur Min 2 Views Right  Result Date: 11/03/2021 CLINICAL DATA:  Fall, right leg  pain EXAM: PELVIS - 1-2 VIEW; RIGHT FEMUR 2 VIEWS COMPARISON:  None Available. FINDINGS: Acute right femoral neck fracture with superior displacement and varus angulation at the fracture site. Hip joint alignment is maintained without dislocation. Pelvic bony ring intact. Distal right femur intact. No appreciable soft tissue abnormality. IMPRESSION: Acute right femoral neck fracture with superior displacement and varus angulation at the fracture site. Electronically Signed   By: 01/04/2022 D.O.   On: 11/03/2021 14:19   DG Pelvis 1-2 Views  Result Date: 11/03/2021 CLINICAL DATA:  Fall, right leg pain EXAM: PELVIS - 1-2 VIEW; RIGHT FEMUR 2 VIEWS COMPARISON:  None Available. FINDINGS: Acute right femoral neck fracture with superior displacement and varus angulation at the fracture site. Hip joint alignment is maintained without dislocation. Pelvic bony ring intact. Distal right femur intact. No appreciable soft tissue abnormality. IMPRESSION: Acute right femoral neck fracture with superior displacement and varus angulation at the fracture site. Electronically Signed   By: 01/04/2022 D.O.   On: 11/03/2021  14:19        Scheduled Meds:  amLODipine  5 mg Oral Daily   enoxaparin (LOVENOX) injection  40 mg Subcutaneous Q24H   insulin aspart  0-6 Units Subcutaneous Q6H   lisinopril  10 mg Oral Daily   mupirocin ointment  1 Application Nasal BID   propranolol  20 mg Oral BID   senna  1 tablet Oral BID   Continuous Infusions:  lactated ringers 75 mL/hr at 11/04/21 2223     LOS: 2 days    Time spent: 35 minutes    Irine Seal, MD [facility]   To contact the attending provider between 7A-7P or the covering provider during after hours 7P-7A, please log into the web site [email or web address] and access using universal  password for that web site. If you do not have the password, please call the hospital operator.  11/05/2021, 10:27 AM

## 2021-11-05 NOTE — Plan of Care (Signed)

## 2021-11-05 NOTE — Anesthesia Procedure Notes (Signed)
Procedure Name: Intubation Date/Time: 11/05/2021 3:47 PM  Performed by: Darral Dash, DOPre-anesthesia Checklist: Patient identified, Emergency Drugs available, Suction available and Patient being monitored Patient Re-evaluated:Patient Re-evaluated prior to induction Oxygen Delivery Method: Circle system utilized Preoxygenation: Pre-oxygenation with 100% oxygen Induction Type: IV induction and Cricoid Pressure applied Ventilation: Mask ventilation without difficulty Laryngoscope Size: Miller and 3 Grade View: Grade I Tube type: Oral Tube size: 7.0 mm Number of attempts: 2 Airway Equipment and Method: Stylet and Oral airway Placement Confirmation: ETT inserted through vocal cords under direct vision, positive ETCO2 and breath sounds checked- equal and bilateral Secured at: 21 cm Tube secured with: Tape Dental Injury: Teeth and Oropharynx as per pre-operative assessment  Comments: First unsuccessful attempt by CRNA with MAC 4. Second attempt successful by MD.

## 2021-11-05 NOTE — Transfer of Care (Signed)
Immediate Anesthesia Transfer of Care Note  Patient: Monica Ayers  Procedure(s) Performed: RIGHT HIP HEMI ARTHROPLASTY (Right: Hip)  Patient Location: PACU  Anesthesia Type:General  Level of Consciousness: drowsy  Airway & Oxygen Therapy: Patient Spontanous Breathing  Post-op Assessment: Report given to RN and Post -op Vital signs reviewed and stable  Post vital signs: Reviewed and stable  Last Vitals:  Vitals Value Taken Time  BP 150/75   Temp    Pulse 64 11/05/21 1800  Resp 13 11/05/21 1800  SpO2 98 % 11/05/21 1800  Vitals shown include unvalidated device data.  Last Pain:  Vitals:   11/05/21 1342  TempSrc: Oral  PainSc:          Complications: No notable events documented.

## 2021-11-05 NOTE — Anesthesia Preprocedure Evaluation (Addendum)
Anesthesia Evaluation  Patient identified by MRN, date of birth, ID band Patient unresponsive  General Assessment Comment:Non verbal at baseline 2/2 vascular dementia   Airway Mallampati: II  TM Distance: >3 FB Neck ROM: Full    Dental no notable dental hx.    Pulmonary neg pulmonary ROS, former smoker,    Pulmonary exam normal        Cardiovascular hypertension, Pt. on medications and Pt. on home beta blockers  Rhythm:Regular Rate:Normal     Neuro/Psych Dementia negative neurological ROS     GI/Hepatic negative GI ROS, Neg liver ROS,   Endo/Other  diabetes, Type 2Hyperthyroidism   Renal/GU negative Renal ROS  negative genitourinary   Musculoskeletal Femoral neck fx   Abdominal Normal abdominal exam  (+)   Peds  Hematology negative hematology ROS (+)   Anesthesia Other Findings   Reproductive/Obstetrics                            Anesthesia Physical Anesthesia Plan  ASA: 3  Anesthesia Plan: General   Post-op Pain Management:    Induction: Intravenous  PONV Risk Score and Plan: 3 and Ondansetron, Dexamethasone and Treatment may vary due to age or medical condition  Airway Management Planned: Mask and Oral ETT  Additional Equipment: None  Intra-op Plan:   Post-operative Plan: Extubation in OR  Informed Consent: I have reviewed the patients History and Physical, chart, labs and discussed the procedure including the risks, benefits and alternatives for the proposed anesthesia with the patient or authorized representative who has indicated his/her understanding and acceptance.     Dental advisory given  Plan Discussed with: CRNA  Anesthesia Plan Comments: (Lab Results      Component                Value               Date                      WBC                      9.0                 11/05/2021                HGB                      14.0                11/05/2021                 HCT                      41.5                11/05/2021                MCV                      87.0                11/05/2021                PLT                      266  11/05/2021           Lab Results      Component                Value               Date                      NA                       138                 11/05/2021                K                        4.1                 11/05/2021                CO2                      26                  11/05/2021                GLUCOSE                  162 (H)             11/05/2021                BUN                      15                  11/05/2021                CREATININE               0.80                11/05/2021                CALCIUM                  8.9                 11/05/2021                GFRNONAA                 >60                 11/05/2021          )       Anesthesia Quick Evaluation

## 2021-11-05 NOTE — Progress Notes (Signed)
Spoke on the phone with Carrie Dickerson, legal guardian, [phone] regarding procedure, risks, and benefits of R hip hemiarthroplasty procedure today. She said we can also speak to the social worker, Chance, involved in her care: [phone] 

## 2021-11-05 NOTE — Consult Note (Signed)
ORTHOPAEDIC CONSULTATION  REQUESTING PHYSICIAN: Erick Blinks, DO  Chief Complaint: R hip fx  HPI: Monica Ayers is a 61 y.o. female who sustained a fall at the nursing facility where she resides.  The fall occurred on Sunday night and was reportedly unwitnessed.  She was seen at [facility] emergency room yesterday where x-rays demonstrated a displaced femoral neck fracture.  She was transferred last night from [facility] for surgical management of her fracture.  Patient is nonverbal does not follow commands.  History significant for advanced vascular dementia.  Past Medical History:  Diagnosis Date   Allergic state 10/02/2016   Allergy    Dementia (HCC)    Diabetes mellitus without complication (HCC)    Hypertension    Hyperthyroidism    Lactic acidosis 11/17/2018   Pressure injury of skin 11/18/2018   Sepsis (HCC) 12/28/2018   Sepsis due to undetermined organism (HCC) 11/17/2018   Past Surgical History:  Procedure Laterality Date   FRACTURE SURGERY     KNEE SURGERY Right    NECK SURGERY     SHOULDER SURGERY Left    Social History   Socioeconomic History   Marital status: Married    Spouse name: Not on file   Number of children: Not on file   Years of education: Not on file   Highest education level: Not on file  Occupational History   Not on file  Tobacco Use   Smoking status: Former    Types: Cigarettes   Smokeless tobacco: Never  Vaping Use   Vaping Use: Never used  Substance and Sexual Activity   Alcohol use: No   Drug use: No   Sexual activity: Not Currently  Other Topics Concern   Not on file  Social History Narrative   Not on file   Social Determinants of Health   Financial Resource Strain: Not on file  Food Insecurity: Not on file  Transportation Needs: Not on file  Physical Activity: Not on file  Stress: Not on file  Social Connections: Not on file   History reviewed. No pertinent family history. Allergies  Allergen Reactions   Latex  Rash     Positive ROS: All other systems have been reviewed and were otherwise negative with the exception of those mentioned in the HPI and as above.  Physical Exam: General: Alert, no acute distress Cardiovascular: No pedal edema Respiratory: No cyanosis, no use of accessory musculature Skin: No lesions in the area of chief complaint Neurologic: Sensation intact distally Psychiatric: Patient non verbal, dementia  MUSCULOSKELETAL:  RLE No traumatic wounds, ecchymosis, or rash  Nontender  Hip ROM deferred given known fx  No knee or ankle effusion  Patient unable to participate with motor sensory exam  DP 2+, No significant edema  LLE No traumatic wounds, ecchymosis, or rash  Nontender  No groin pain with log roll  No knee or ankle effusion  Patient unable to participate with motor sensory exam  DP 2+, No significant edema   IMAGING: xrays demonstrate a right displaced femoral neck fracture  Assessment: Principal Problem:   Closed fracture of right hip (HCC) Active Problems:   Essential hypertension   Type 2 diabetes mellitus without complication, without long-term current use of insulin (HCC)   Frontal lobe dementia (HCC)   Right displaced femoral neck fracture  Plan: Patient with displacement neck fracture given health and baseline function to be a good candidate for hemiarthroplasty. Patient is a ward of the state and will  need to contact her legal guardian this morning for consent.   Patient is n.p.o. plan for surgery later today pending OR availability.    Joen Laura, MD  Contact information:   [id number] 7am-5pm epic message Dr. Blanchie Dessert, or call office for patient follow up: [phone] After hours and holidays please check [email or web address] for group call information for [facility]

## 2021-11-05 NOTE — TOC CAGE-AID Note (Signed)
Transition of Care [facility]) - CAGE-AID Screening   Patient Details  Name: SATIVA GELLES MRN: [id number] Date of Birth: 02-11-1961  Transition of Care [facility]) CM/SW Contact:    Katha Hamming, RN Phone [phone] 11/05/2021, 10:59 PM     CAGE-AID Screening: Substance Abuse Screening unable to be completed due to: : Patient unable to participate (hx dementia, nonverbal at baseline)

## 2021-11-05 NOTE — Interval H&P Note (Signed)
The patient has been re-examined, and the chart reviewed, and there have been no interval changes to the documented history and physical.    Patient nonverbal and does not follow commands. R displaced femoral neck fx, minimally ambulatory at baseline.  Plan for R cemented hip hemiarthroplasty.  The operative side was examined and the patient was confirmed to have spontaneous foot and ankle plantarflexion and dorsiflexion. DP 2+, PT 2+, No significant edema   The risks, benefits, and alternatives have been discussed at length with Nilda Calamity, legal guardian, [phone], patient is a ward of the state.  Right hip marked. Consent has been signed.

## 2021-11-06 ENCOUNTER — Encounter (HOSPITAL_COMMUNITY): Payer: Self-pay | Admitting: Orthopedic Surgery

## 2021-11-06 DIAGNOSIS — I10 Essential (primary) hypertension: Secondary | ICD-10-CM | POA: Diagnosis not present

## 2021-11-06 DIAGNOSIS — W19XXXA Unspecified fall, initial encounter: Secondary | ICD-10-CM | POA: Diagnosis not present

## 2021-11-06 DIAGNOSIS — S72001P Fracture of unspecified part of neck of right femur, subsequent encounter for closed fracture with malunion: Secondary | ICD-10-CM | POA: Diagnosis not present

## 2021-11-06 DIAGNOSIS — G3109 Other frontotemporal dementia: Secondary | ICD-10-CM | POA: Diagnosis not present

## 2021-11-06 LAB — GLUCOSE, CAPILLARY
Glucose-Capillary: 131 mg/dL — ABNORMAL HIGH (ref 70–99)
Glucose-Capillary: 141 mg/dL — ABNORMAL HIGH (ref 70–99)
Glucose-Capillary: 161 mg/dL — ABNORMAL HIGH (ref 70–99)
Glucose-Capillary: 170 mg/dL — ABNORMAL HIGH (ref 70–99)
Glucose-Capillary: 180 mg/dL — ABNORMAL HIGH (ref 70–99)
Glucose-Capillary: 196 mg/dL — ABNORMAL HIGH (ref 70–99)

## 2021-11-06 LAB — BASIC METABOLIC PANEL
Anion gap: 13 (ref 5–15)
BUN: 13 mg/dL (ref 8–23)
CO2: 22 mmol/L (ref 22–32)
Calcium: 8.5 mg/dL — ABNORMAL LOW (ref 8.9–10.3)
Chloride: 104 mmol/L (ref 98–111)
Creatinine, Ser: 0.68 mg/dL (ref 0.44–1.00)
GFR, Estimated: >60 mL/min (ref >60)
Glucose, Bld: 169 mg/dL — ABNORMAL HIGH (ref 70–99)
Potassium: 4.3 mmol/L (ref 3.5–5.1)
Sodium: 139 mmol/L (ref 135–145)

## 2021-11-06 LAB — CBC WITH DIFFERENTIAL/PLATELET
Abs Immature Granulocytes: 0.03 10*3/uL (ref 0.00–0.07)
Basophils Absolute: 0 10*3/uL (ref 0.0–0.1)
Basophils Relative: 0 %
Eosinophils Absolute: 0.2 10*3/uL (ref 0.0–0.5)
Eosinophils Relative: 3 %
HCT: 53 % — ABNORMAL HIGH (ref 36.0–46.0)
Hemoglobin: 16.9 g/dL — ABNORMAL HIGH (ref 12.0–15.0)
Immature Granulocytes: 1 %
Lymphocytes Relative: 13 %
Lymphs Abs: 0.8 10*3/uL (ref 0.7–4.0)
MCH: 29.1 pg (ref 26.0–34.0)
MCHC: 31.9 g/dL (ref 30.0–36.0)
MCV: 91.4 fL (ref 80.0–100.0)
Monocytes Absolute: 0.7 10*3/uL (ref 0.1–1.0)
Monocytes Relative: 11 %
Neutro Abs: 4.5 10*3/uL (ref 1.7–7.7)
Neutrophils Relative %: 72 %
Platelets: 162 10*3/uL (ref 150–400)
RBC: 5.8 MIL/uL — ABNORMAL HIGH (ref 3.87–5.11)
RDW: 13.8 % (ref 11.5–15.5)
WBC: 6.2 10*3/uL (ref 4.0–10.5)
nRBC: 0 % (ref 0.0–0.2)

## 2021-11-06 MED ORDER — INSULIN ASPART 100 UNIT/ML IJ SOLN
0.0000 [IU] | Freq: Three times a day (TID) | INTRAMUSCULAR | Status: DC
  Administered 2021-11-07 (×2): 1 [IU] via SUBCUTANEOUS
  Administered 2021-11-08: 2 [IU] via SUBCUTANEOUS
  Administered 2021-11-08 – 2021-11-09 (×4): 1 [IU] via SUBCUTANEOUS
  Administered 2021-11-09: 3 [IU] via SUBCUTANEOUS
  Administered 2021-11-10: 4 [IU] via SUBCUTANEOUS
  Administered 2021-11-10: 2 [IU] via SUBCUTANEOUS
  Administered 2021-11-11 (×2): 1 [IU] via SUBCUTANEOUS
  Administered 2021-11-11: 2 [IU] via SUBCUTANEOUS

## 2021-11-06 NOTE — Progress Notes (Signed)
Pt is still NPO. RN gave her 1 pill and she was able to swallow with no problem., thin water. Still confused, non-verbal but alert enogh to swallow. MD was informed and diet was advance to CM diet.

## 2021-11-06 NOTE — Progress Notes (Signed)
PROGRESS NOTE    Monica Ayers  YKZ:[id number] DOB: 08-27-1960 DOA: 11/03/2021 PCP: Lindaann Pascal    Chief Complaint  Patient presents with   Fall    Brief Narrative:  Monica Ayers is a 61 y.o. female with medical history significant of hypertension, diabetes mellitus type 2, hyperthyroidism, and vascular dementia who is nonverbal at baseline who presents after having an unwitnessed fall last night around 10 PM.  She has been noted to have a right femoral neck fracture.  She will need transfer to [facility] for further orthopedic evaluation and treatment.  Patient transferred to [facility] and underwent right hip hemiarthroplasty on 11/05/2021 without any complications.   Assessment & Plan:   Principal Problem:   Closed fracture of right hip (HCC) Active Problems:   Essential hypertension   Type 2 diabetes mellitus without complication, without long-term current use of insulin (HCC)   Frontal lobe dementia (HCC)  #1 acute right femoral neck fracture secondary to mechanical fall. -Patient presented after unwitnessed fall noted to have a right femoral neck fracture. -Orthopedics consulted and it is noted that her doctor at their consulted overnight spoke with Dr. Jena Gauss on 7/4 regarding need to transfer patient to [facility] for treatment and patient transferred to [facility]. -Patient admitted to [facility], seen in consultation by orthopedics and underwent right hip hemiarthroplasty 11/05/2021 without any complications. -Orthopedics following recommended WBAT, no formal hip precautions, Lovenox for postop DVT prophylaxis x4 weeks, will need outpatient follow-up with orthopedic surgery in 2 weeks for wound check.   -Per orthopedics.   2.  Hypertension -Patient noted to have soft blood pressure earlier on this morning which has since improved.   -Continue Norvasc, lisinopril, Inderal and monitor blood pressure closely.   -If blood pressure continues to remain soft may consider  discontinuation of lisinopril.    3.  Diabetes mellitus type 2, well controlled without long-term insulin use, -On admission noted to have a glucose at 219. -Hemoglobin A1c 6.1 (11/03/2021). - CBG 131 this morning. -Patient noted to be on metformin at home prior to admission. -It was found hyperglycemia likely related to acute stress response. -Continue to hold metformin. -SSI.  4.  Vascular dementia/frontal lobe dementia -Noted to be nonverbal at baseline. -Pleasantly confused -Outpatient follow-up.  5.  Low TSH  -TSH noted at 0.171. -Free T4 one 1.21. -Outpatient follow-up.   DVT prophylaxis: Lovenox x4 weeks per orthopedics for postop DVT prophylaxis. Code Status: Full Family Communication: No family at bedside. Disposition: TBD  Status is: Inpatient Remains inpatient appropriate because: Severity of illness   Consultants:  Orthopedics: Dr.Marchwiany 11/05/2021  Procedures:  Chest x-ray 11/03/2021 Plain films of the right femur 11/03/2021 Plain films of the pelvis 11/03/2021 Right hip hemiarthroplasty: Per orthopedics, Dr.Marchwiany 11/05/2021  Antimicrobials:  None   Subjective: Sitting up in bed watching television.  Nonverbal.  Noted to have eaten her lunch.  Pleasantly confused  Objective: Vitals:   11/05/21 2345 11/06/21 0359 11/06/21 0800 11/06/21 1202  BP: (!) 109/52 (!) 91/44 (!) 97/56 122/61  Pulse: 67 60 61 78  Resp: 16 16 14 16   Temp: 97.8 F (36.6 C) 98 F (36.7 C) 97.7 F (36.5 C)   TempSrc: Axillary Axillary Oral   SpO2: 97% 95% 97% 98%  Weight:      Height:        Intake/Output Summary (Last 24 hours) at 11/06/2021 1248 Last data filed at 11/06/2021 1200 Gross per 24 hour  Intake 1973.09 ml  Output 1600 ml  Net 373.09 ml    Filed Weights   11/03/21 1334 11/03/21 1939 11/05/21 1342  Weight: 72.6 kg 64.5 kg 64.5 kg    Examination:  General exam: NAD Respiratory system: CTA B anterior lung fields.  No wheezes, no crackles, no rhonchi.   Normal respiratory effort.  Speaking in full sentences.   Cardiovascular system: Regular rate rhythm no murmurs rubs or gallops.  No JVD.  No lower extremity edema.   Gastrointestinal system: Abdomen is soft, nontender, nondistended, positive bowel sounds.  No rebound.  No guarding.   Central nervous system: Alert and oriented. No focal neurological deficits. Extremities: No clubbing cyanosis or edema.   Skin: No rashes, lesions or ulcers Psychiatry: Judgement and insight appear poor.  Mood and affect appropriate.      Data Reviewed: I have personally reviewed following labs and imaging studies  CBC: Recent Labs  Lab 11/03/21 1431 11/04/21 0501 11/05/21 0212 11/06/21 0255  WBC 12.0* 10.0 9.0 6.2  NEUTROABS 9.8*  --   --  4.5  HGB 13.8 13.8 14.0 16.9*  HCT 40.4 40.9 41.5 53.0*  MCV 86.1 87.8 87.0 91.4  PLT 235 265 266 162     Basic Metabolic Panel: Recent Labs  Lab 11/03/21 1431 11/04/21 0501 11/05/21 0212 11/06/21 0255  NA 137 138 138 139  K 3.6 3.6 4.1 4.3  CL 106 106 102 104  CO2 23 24 26 22   GLUCOSE 219* 166* 162* 169*  BUN 11 14 15 13   CREATININE 0.56 0.60 0.80 0.68  CALCIUM 8.7* 8.9 8.9 8.5*  MG  --   --  2.2  --      GFR: Estimated Creatinine Clearance: 71.8 mL/min (by C-G formula based on SCr of 0.68 mg/dL).  Liver Function Tests: No results for input(s): "AST", "ALT", "ALKPHOS", "BILITOT", "PROT", "ALBUMIN" in the last 168 hours.  CBG: Recent Labs  Lab 11/05/21 2016 11/06/21 0033 11/06/21 0409 11/06/21 0827 11/06/21 1154  GLUCAP 186* 196* 141* 131* 161*      Recent Results (from the past 240 hour(s))  Surgical PCR screen     Status: None   Collection Time: 11/05/21  8:19 AM   Specimen: Nasal Mucosa; Nasal Swab  Result Value Ref Range Status   MRSA, PCR NEGATIVE NEGATIVE Final   Staphylococcus aureus NEGATIVE NEGATIVE Final    Comment: (NOTE) The Xpert SA Assay (FDA approved for NASAL specimens in patients 22 years of age and older),  is one component of a comprehensive surveillance program. It is not intended to diagnose infection nor to guide or monitor treatment. Performed at [facility], [address] [place], [address] [place]          Radiology Studies: DG HIP UNILAT W OR W/O PELVIS 2-3 VIEWS RIGHT  Result Date: 11/05/2021 CLINICAL DATA:  Status post right hip replacement, initial encounter EXAM: DG HIP (WITH OR WITHOUT PELVIS) 2-3V RIGHT COMPARISON:  11/03/2021 FINDINGS: Pelvic ring is intact. New right hip prosthesis is noted in satisfactory position. No acute abnormality is noted. IMPRESSION: Status post right hip replacement. Electronically Signed   By: 01/06/2022 M.D.   On: 11/05/2021 20:03        Scheduled Meds:  amLODipine  5 mg Oral Daily   enoxaparin (LOVENOX) injection  40 mg Subcutaneous Q24H   insulin aspart  0-6 Units Subcutaneous Q6H   lisinopril  10 mg Oral Daily   mupirocin ointment  1 Application Nasal BID   propranolol  20 mg Oral BID   senna  1 tablet Oral BID   Continuous Infusions:  lactated ringers 75 mL/hr at 11/06/21 0949     LOS: 3 days    Time spent: 35 minutes    Ramiro Harvest, MD [facility]   To contact the attending provider between 7A-7P or the covering provider during after hours 7P-7A, please log into the web site [email or web address] and access using universal [place] password for that web site. If you do not have the password, please call the hospital operator.  11/06/2021, 12:48 PM

## 2021-11-06 NOTE — Plan of Care (Signed)
  Problem: Education: Goal: Ability to describe self-care measures that may prevent or decrease complications (Diabetes Survival Skills Education) will improve Outcome: Not Met (add Reason) Goal: Individualized Educational Video(s) Outcome: Not Met (add Reason)   Problem: Coping: Goal: Ability to adjust to condition or change in health will improve Outcome: Not Met (add Reason)   Problem: Fluid Volume: Goal: Ability to maintain a balanced intake and output will improve Outcome: Not Met (add Reason)   Problem: Health Behavior/Discharge Planning: Goal: Ability to identify and utilize available resources and services will improve Outcome: Not Met (add Reason) Goal: Ability to manage health-related needs will improve Outcome: Not Met (add Reason)   Problem: Metabolic: Goal: Ability to maintain appropriate glucose levels will improve Outcome: Not Met (add Reason)   Problem: Nutritional: Goal: Maintenance of adequate nutrition will improve Outcome: Not Met (add Reason) Goal: Progress toward achieving an optimal weight will improve Outcome: Not Met (add Reason)

## 2021-11-06 NOTE — Care Management Important Message (Signed)
Important Message  Patient Details  Name: Monica Ayers MRN: [id number] Date of Birth: 1960-11-23   Medicare Important Message Given:  Yes     Dorena Bodo 11/06/2021, 3:27 PM

## 2021-11-06 NOTE — Anesthesia Postprocedure Evaluation (Signed)
Anesthesia Post Note  Patient: SHAANA ACOCELLA  Procedure(s) Performed: RIGHT HIP HEMI ARTHROPLASTY (Right: Hip)     Patient location during evaluation: PACU Anesthesia Type: General Level of consciousness: awake and alert Pain management: pain level controlled Vital Signs Assessment: post-procedure vital signs reviewed and stable Respiratory status: spontaneous breathing, nonlabored ventilation, respiratory function stable and patient connected to nasal cannula oxygen Cardiovascular status: blood pressure returned to baseline and stable Postop Assessment: no apparent nausea or vomiting Anesthetic complications: no   No notable events documented.  Last Vitals:  Vitals:   11/05/21 2345 11/06/21 0359  BP: (!) 109/52 (!) 91/44  Pulse: 67 60  Resp: 16 16  Temp: 36.6 C 36.7 C  SpO2: 97% 95%    Last Pain:  Vitals:   11/06/21 0359  TempSrc: Axillary  PainSc:                  Nelle Don Yariah Selvey

## 2021-11-06 NOTE — Final Progress Note (Signed)
     Subjective: Patient lying comfortably in bed. Moving extremities spontaneously but not following commands given patient's baseline advanced dementia.  Objective:   VITALS:   Vitals:   11/05/21 1930 11/05/21 2013 11/05/21 2345 11/06/21 0359  BP: (!) 152/79 129/90 (!) 109/52 (!) 91/44  Pulse: 68 77 67 60  Resp: 15 17 16 16   Temp:  (!) 97.1 F (36.2 C) 97.8 F (36.6 C) 98 F (36.7 C)  TempSrc:   Axillary Axillary  SpO2: 97%  97% 95%  Weight:      Height:        Sensation intact distally Intact pulses distally Dorsiflexion/Plantar flexion intact Incision: dressing C/D/I No cellulitis present   Lab Results  Component Value Date   WBC 6.2 11/06/2021   HGB 16.9 (H) 11/06/2021   HCT 53.0 (H) 11/06/2021   MCV 91.4 11/06/2021   PLT 162 11/06/2021   BMET    Component Value Date/Time   NA 139 11/06/2021 0255   K 4.3 11/06/2021 0255   CL 104 11/06/2021 0255   CO2 22 11/06/2021 0255   GLUCOSE 169 (H) 11/06/2021 0255   BUN 13 11/06/2021 0255   CREATININE 0.68 11/06/2021 0255   CREATININE 0.62 01/16/2019 1112   CALCIUM 8.5 (L) 11/06/2021 0255   GFRNONAA >60 11/06/2021 0255   GFRNONAA 99 01/16/2019 1112      Xray: hemi components in good position without adverse features  Assessment/Plan: 1 Day Post-Op   Principal Problem:   Closed fracture of right hip (HCC) Active Problems:   Essential hypertension   Type 2 diabetes mellitus without complication, without long-term current use of insulin (HCC)   Frontal lobe dementia (HCC)  S/p R hip hemi for 7/5  Post op recs: WB: WBAT no formal hip precuations Abx: ancef x23 hours post op Imaging: PACU xrays Dressing: Aquacel dressing to be kept intact until follow-up DVT prophylaxis: lovenox starting POD1 x4 weeks Follow up: 2 weeks after surgery for a wound check with Dr. 9/5 at [facility].  Address: [address], [place], [place]  Office Phone: [phone]    [phone] 11/06/2021, 6:57 AM   01/07/2022, MD  Contact information:   [phone] 7am-5pm epic message Dr. [email or web address], or call office for patient follow up: [phone] After hours and holidays please check [email or web address] for group call information for [facility]

## 2021-11-07 DIAGNOSIS — S72001P Fracture of unspecified part of neck of right femur, subsequent encounter for closed fracture with malunion: Secondary | ICD-10-CM | POA: Diagnosis not present

## 2021-11-07 DIAGNOSIS — G3109 Other frontotemporal dementia: Secondary | ICD-10-CM | POA: Diagnosis not present

## 2021-11-07 DIAGNOSIS — W19XXXA Unspecified fall, initial encounter: Secondary | ICD-10-CM | POA: Diagnosis not present

## 2021-11-07 DIAGNOSIS — I10 Essential (primary) hypertension: Secondary | ICD-10-CM | POA: Diagnosis not present

## 2021-11-07 LAB — GLUCOSE, CAPILLARY
Glucose-Capillary: 176 mg/dL — ABNORMAL HIGH (ref 70–99)
Glucose-Capillary: 172 mg/dL — ABNORMAL HIGH (ref 70–99)
Glucose-Capillary: 150 mg/dL — ABNORMAL HIGH (ref 70–99)
Glucose-Capillary: 216 mg/dL — ABNORMAL HIGH (ref 70–99)

## 2021-11-07 LAB — BASIC METABOLIC PANEL
Anion gap: 10 (ref 5–15)
BUN: 12 mg/dL (ref 8–23)
CO2: 25 mmol/L (ref 22–32)
Calcium: 8.2 mg/dL — ABNORMAL LOW (ref 8.9–10.3)
Chloride: 104 mmol/L (ref 98–111)
Creatinine, Ser: 0.65 mg/dL (ref 0.44–1.00)
GFR, Estimated: >60 mL/min (ref >60)
Glucose, Bld: 192 mg/dL — ABNORMAL HIGH (ref 70–99)
Potassium: 3.5 mmol/L (ref 3.5–5.1)
Sodium: 139 mmol/L (ref 135–145)

## 2021-11-07 LAB — CBC
HCT: 32.3 % — ABNORMAL LOW (ref 36.0–46.0)
Hemoglobin: 11.1 g/dL — ABNORMAL LOW (ref 12.0–15.0)
MCH: 29.7 pg (ref 26.0–34.0)
MCHC: 34.4 g/dL (ref 30.0–36.0)
MCV: 86.4 fL (ref 80.0–100.0)
Platelets: 270 10*3/uL (ref 150–400)
RBC: 3.74 MIL/uL — ABNORMAL LOW (ref 3.87–5.11)
RDW: 13.6 % (ref 11.5–15.5)
WBC: 8.9 10*3/uL (ref 4.0–10.5)
nRBC: 0 % (ref 0.0–0.2)

## 2021-11-07 MED ORDER — POTASSIUM CHLORIDE CRYS ER 10 MEQ PO TBCR
10.0000 meq | EXTENDED_RELEASE_TABLET | Freq: Every morning | ORAL | Status: DC
  Administered 2021-11-07 – 2021-11-08 (×2): 10 meq via ORAL
  Filled 2021-11-07 (×2): qty 1

## 2021-11-07 MED ORDER — VITAMIN D 25 MCG (1000 UNIT) PO TABS
2000.0000 [IU] | ORAL_TABLET | Freq: Every day | ORAL | Status: DC
Start: 2021-11-07 — End: 2021-11-11
  Administered 2021-11-07 – 2021-11-11 (×5): 2000 [IU] via ORAL
  Filled 2021-11-07 (×5): qty 2

## 2021-11-07 MED ORDER — LISINOPRIL 10 MG PO TABS
10.0000 mg | ORAL_TABLET | Freq: Every day | ORAL | Status: DC
  Administered 2021-11-08: 10 mg via ORAL
  Filled 2021-11-07: qty 1

## 2021-11-07 MED ORDER — ATORVASTATIN CALCIUM 40 MG PO TABS
40.0000 mg | ORAL_TABLET | Freq: Every day | ORAL | Status: DC
Start: 2021-11-07 — End: 2021-11-11
  Administered 2021-11-07 – 2021-11-11 (×5): 40 mg via ORAL
  Filled 2021-11-07 (×5): qty 1

## 2021-11-07 MED ORDER — ASPIRIN 81 MG PO CHEW
81.0000 mg | CHEWABLE_TABLET | Freq: Every day | ORAL | Status: DC
  Administered 2021-11-07 – 2021-11-11 (×5): 81 mg via ORAL
  Filled 2021-11-07 (×5): qty 1

## 2021-11-07 NOTE — Progress Notes (Signed)
     Subjective: Patient lying comfortably in bed. Alert, not answering questions given dementia. Patient did not work with physical therapy yesterday. Per discussion with social worker who knows patient well. Patient really like to walk before her hip fx at her residence. Did not use assistive device.  Objective:   VITALS:   Vitals:   11/06/21 1202 11/06/21 1855 11/06/21 1900 11/07/21 0618  BP: 122/61  (!) 143/75 124/66  Pulse: 78 88 79   Resp: 16 17 17    Temp:   99.5 F (37.5 C)   TempSrc:   Axillary   SpO2: 98% 100% 99%   Weight:      Height:        Sensation intact distally Intact pulses distally Dorsiflexion/Plantar flexion intact Incision: dressing C/D/I No cellulitis present   Lab Results  Component Value Date   WBC 8.9 11/07/2021   HGB 11.1 (L) 11/07/2021   HCT 32.3 (L) 11/07/2021   MCV 86.4 11/07/2021   PLT 270 11/07/2021   BMET    Component Value Date/Time   NA 139 11/07/2021 0156   K 3.5 11/07/2021 0156   CL 104 11/07/2021 0156   CO2 25 11/07/2021 0156   GLUCOSE 192 (H) 11/07/2021 0156   BUN 12 11/07/2021 0156   CREATININE 0.65 11/07/2021 0156   CREATININE 0.62 01/16/2019 1112   CALCIUM 8.2 (L) 11/07/2021 0156   GFRNONAA >60 11/07/2021 0156   GFRNONAA 99 01/16/2019 1112      Xray: hemi components in good position without adverse features  Assessment/Plan: 2 Days Post-Op   Principal Problem:   Closed fracture of right hip (HCC) Active Problems:   Essential hypertension   Type 2 diabetes mellitus without complication, without long-term current use of insulin (HCC)   Frontal lobe dementia (HCC)  S/p R hip hemi for 7/5  Post op recs: WB: WBAT no formal hip precuations Abx: ancef x23 hours post op Imaging: PACU xrays Dressing: Aquacel dressing to be kept intact until follow-up DVT prophylaxis: lovenox starting POD1 x4 weeks Follow up: 2 weeks after surgery for a wound check with Dr. 9/5 at [facility].  Address:  [address], [place], [place]  Office Phone: [phone]    [phone] 11/07/2021, 7:30 AM   01/08/2022, MD  Contact information:   [phone] 7am-5pm epic message Dr. [id number], or call office for patient follow up: [phone] After hours and holidays please check [email or web address] for group call information for [facility]

## 2021-11-07 NOTE — Evaluation (Signed)
Physical Therapy Evaluation Patient Details Name: Monica Ayers MRN: [id number] DOB: Mar 19, 1961 Today's Date: 11/07/2021  History of Present Illness  61 y/o female presented to ED on 11/03/21 following fall at facility. Sustained R femoral neck fx with superior displacement. S/p R hip hemiarthroplasty on 7/5. PMH: dementia, nonverbal, HTN, T2DM  Clinical Impression  Patient admitted with the above. PTA, patient from memory care facility and per chart, was able to ambulate and skip around facility without assistance. Patient is nonverbal at baseline. Patient limited by functional deficits listed below. Patient not following commands throughout session. Patient requiring totalA+2 for bed mobility and totalA to maintain sitting balance with heavy posterior lean. Patient will benefit from skilled PT services during acute stay to address listed deficits. Recommend SNF at discharge to maximize functional mobility and safety to decrease burden of care.      Recommendations for follow up therapy are one component of a multi-disciplinary discharge planning process, led by the attending physician.  Recommendations may be updated based on patient status, additional functional criteria and insurance authorization.  Follow Up Recommendations Skilled nursing-short term rehab (<3 hours/day) Can patient physically be transported by private vehicle: No    Assistance Recommended at Discharge Frequent or constant Supervision/Assistance  Patient can return home with the following  Two people to help with walking and/or transfers;Two people to help with bathing/dressing/bathroom    Equipment Recommendations Other (comment) (defer to next venue of care)  Recommendations for Other Services       Functional Status Assessment Patient has had a recent decline in their functional status and/or demonstrates limited ability to make significant improvements in function in a reasonable and predictable amount of time      Precautions / Restrictions Precautions Precautions: Fall Precaution Comments: nonverbal, mittens due to finger biting Restrictions Weight Bearing Restrictions: Yes RLE Weight Bearing: Weight bearing as tolerated      Mobility  Bed Mobility Overal bed mobility: Needs Assistance Bed Mobility: Supine to Sit, Sit to Supine     Supine to sit: Total assist, +2 for physical assistance, +2 for safety/equipment Sit to supine: Total assist, +2 for physical assistance, +2 for safety/equipment   General bed mobility comments: totalA+2 for all aspects of bed mobility with no initiaton by patient. Resistance felt with attempts to move patient's arms or legs. Once in sitting, heavy posterior lean requiring totalA to maintain    Transfers                   General transfer comment: deferred due to not following commands and heavy posterior lean in sitting    Ambulation/Gait                  Stairs            Wheelchair Mobility    Modified Rankin (Stroke Patients Only)       Balance Overall balance assessment: Needs assistance Sitting-balance support: No upper extremity supported, Feet supported Sitting balance-Leahy Scale: Zero Sitting balance - Comments: totalA to maintain sitting balance                                     Pertinent Vitals/Pain Pain Assessment Pain Assessment: Faces Faces Pain Scale: No hurt Pain Intervention(s): Monitored during session    Home Living Family/patient expects to be discharged to:: Skilled nursing facility  Prior Function Prior Level of Function : Patient poor historian/Family not available             Mobility Comments: unsure of mobility. Per chart, patient ambulatory around facility and at times skipping through facility       Hand Dominance        Extremity/Trunk Assessment   Upper Extremity Assessment Upper Extremity Assessment: Defer to OT evaluation     Lower Extremity Assessment Lower Extremity Assessment: Generalized weakness    Cervical / Trunk Assessment Cervical / Trunk Assessment: Kyphotic  Communication   Communication: Expressive difficulties;Other (comment) (nonverbal at baseline)  Cognition Arousal/Alertness: Awake/alert Behavior During Therapy: Flat affect Overall Cognitive Status: History of cognitive impairments - at baseline                                 General Comments: hx of dementia and nonverbal at baseline. Not following commands        General Comments General comments (skin integrity, edema, etc.): On 1L O2 on arrival, spO2 100%. Removed for mobility with VSS    Exercises     Assessment/Plan    PT Assessment Patient needs continued PT services  PT Problem List Decreased strength;Decreased activity tolerance;Decreased balance;Decreased mobility;Decreased coordination;Decreased knowledge of use of DME;Decreased cognition;Decreased safety awareness;Decreased knowledge of precautions       PT Treatment Interventions DME instruction;Gait training;Functional mobility training;Therapeutic activities;Therapeutic exercise;Balance training;Neuromuscular re-education;Patient/family education    PT Goals (Current goals can be found in the Care Plan section)  Acute Rehab PT Goals Patient Stated Goal: unable to state PT Goal Formulation: Patient unable to participate in goal setting Time For Goal Achievement: 11/21/21 Potential to Achieve Goals: Fair    Frequency Min 2X/week     Co-evaluation PT/OT/SLP Co-Evaluation/Treatment: Yes Reason for Co-Treatment: For patient/therapist safety;To address functional/ADL transfers;Necessary to address cognition/behavior during functional activity PT goals addressed during session: Mobility/safety with mobility         AM-PAC PT "6 Clicks" Mobility  Outcome Measure Help needed turning from your back to your side while in a flat bed without using  bedrails?: Total Help needed moving from lying on your back to sitting on the side of a flat bed without using bedrails?: Total Help needed moving to and from a bed to a chair (including a wheelchair)?: Total Help needed standing up from a chair using your arms (e.g., wheelchair or bedside chair)?: Total Help needed to walk in hospital room?: Total Help needed climbing 3-5 steps with a railing? : Total 6 Click Score: 6    End of Session   Activity Tolerance: Patient tolerated treatment well Patient left: in bed;with call bell/phone within reach;with bed alarm set Nurse Communication: Mobility status;Other (comment) (need for linen change) PT Visit Diagnosis: Muscle weakness (generalized) (M62.81);Unsteadiness on feet (R26.81);History of falling (Z91.81)    Time: 2025-4270 PT Time Calculation (min) (ACUTE ONLY): 20 min   Charges:   PT Evaluation $PT Eval Moderate Complexity: 1 Mod          Jazelle Achey A. Dan Humphreys PT, DPT Acute Rehabilitation Services Office [phone]   Viviann Spare 11/07/2021, 10:49 AM

## 2021-11-07 NOTE — NC FL2 (Signed)
[place] MEDICAID FL2 LEVEL OF CARE SCREENING TOOL     IDENTIFICATION  Patient Name: Monica Ayers Birthdate: 05-31-1960 Sex: female Admission Date (Current Location): 11/03/2021  [facility] [address] and [place] Number:  Producer, television/film/video and Address:  [facility], [address], [place], [place] [address]      Provider Number: [id number]  Attending Physician Name and Address:  Rodolph Bong, MD  Relative Name and Phone Number:  [facility] Legal Guardian   [phone]    Current Level of Care: Hospital Recommended Level of Care: Skilled Nursing Facility Prior Approval Number:    Date Approved/Denied:   PASRR Number: [id number]  Discharge Plan: SNF    Current Diagnoses: Patient Active Problem List   Diagnosis Date Noted   Closed fracture of right hip (HCC) 11/03/2021   Hyperlipidemia 05/28/2019   Hypernatremia 05/18/2019   Hyperthyroidism 01/18/2019   Hypokalemia    Unsteady gait 12/13/2018   Falls frequently 12/13/2018   Transaminasemia 11/18/2018   Type 2 diabetes mellitus without complication, without long-term current use of insulin (HCC) 11/17/2018   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   Essential hypertension 10/02/2016    Orientation RESPIRATION BLADDER Height & Weight     Self, Time, Situation, Place  Normal Incontinent Weight: 142 lb 3.2 oz (64.5 kg) Height:  5\' 7"  (170.2 cm)  BEHAVIORAL SYMPTOMS/MOOD NEUROLOGICAL BOWEL NUTRITION STATUS      Continent Diet (see discharge summary)  AMBULATORY STATUS COMMUNICATION OF NEEDS Skin   Total Care Non-Verbally Surgical wounds                       Personal Care Assistance Level of Assistance  Bathing, Feeding, Dressing, Total care Bathing Assistance: Maximum assistance Feeding assistance: Maximum assistance Dressing Assistance: Maximum assistance Total Care Assistance: Maximum assistance   Functional Limitations Info  Sight, Hearing, Speech Sight Info:  Adequate Hearing Info: Adequate Speech Info: Impaired    SPECIAL CARE FACTORS FREQUENCY  PT (By licensed PT), OT (By licensed OT)     PT Frequency: 5x week OT Frequency: 5x week            Contractures Contractures Info: Not present    Additional Factors Info  Code Status, Allergies, Insulin Sliding Scale Code Status Info: full Allergies Info: latex   Insulin Sliding Scale Info: see discharge summary       Current Medications (11/07/2021):  This is the current hospital active medication list Current Facility-Administered Medications  Medication Dose Route Frequency Provider Last Rate Last Admin   aspirin chewable tablet 81 mg  81 mg Oral Daily 01/08/2022, MD   81 mg at 11/07/21 1102   atorvastatin (LIPITOR) tablet 40 mg  40 mg Oral q1800 01/08/22, MD       cholecalciferol (VITAMIN D3) tablet 2,000 Units  2,000 Units Oral Daily Rodolph Bong, MD   2,000 Units at 11/07/21 1103   enoxaparin (LOVENOX) injection 40 mg  40 mg Subcutaneous Q24H 01/08/22, MD   40 mg at 11/06/21 1719   hydrALAZINE (APRESOLINE) injection 10 mg  10 mg Intravenous Q4H PRN 01/07/22, MD       HYDROcodone-acetaminophen (NORCO/VICODIN) 5-325 MG per tablet 1-2 tablet  1-2 tablet Oral Q6H PRN Joen Laura, MD   2 tablet at 11/07/21 0445   insulin aspart (novoLOG) injection 0-6 Units  0-6 Units Subcutaneous TID WC 01/08/22, MD   1 Units at 11/07/21  1136   lactated ringers infusion   Intravenous Continuous Joen Laura, MD 75 mL/hr at 11/07/21 0047 New Bag at 11/07/21 0047   [START ON 11/08/2021] lisinopril (ZESTRIL) tablet 10 mg  10 mg Oral Daily Rodolph Bong, MD       morphine (PF) 2 MG/ML injection 0.5 mg  0.5 mg Intravenous Q2H PRN Joen Laura, MD   0.5 mg at 11/06/21 2115   mupirocin ointment (BACTROBAN) 2 % 1 Application  1 Application Nasal BID Joen Laura, MD   1 Application at 11/07/21 0815   potassium  chloride (KLOR-CON M) CR tablet 10 mEq  10 mEq Oral q morning Rodolph Bong, MD   10 mEq at 11/07/21 1103   propranolol (INDERAL) tablet 20 mg  20 mg Oral BID Joen Laura, MD   20 mg at 11/07/21 7106   senna (SENOKOT) tablet 8.6 mg  1 tablet Oral BID Joen Laura, MD   8.6 mg at 11/07/21 2694     Discharge Medications: Please see discharge summary for a list of discharge medications.  Relevant Imaging Results:  Relevant Lab Results:   Additional Information SSN: [id number]  Lorri Frederick, LCSW

## 2021-11-07 NOTE — Progress Notes (Signed)
PROGRESS NOTE    Monica Ayers  DGU:[id number] DOB: Feb 03, 1961 DOA: 11/03/2021 PCP: Lindaann Pascal    Chief Complaint  Patient presents with   Fall    Brief Narrative:  Monica Ayers is a 61 y.o. female with medical history significant of hypertension, diabetes mellitus type 2, hyperthyroidism, and vascular dementia who is nonverbal at baseline who presents after having an unwitnessed fall last night around 10 PM.  She has been noted to have a right femoral neck fracture.  She will need transfer to [facility] for further orthopedic evaluation and treatment.  Patient transferred to [facility] and underwent right hip hemiarthroplasty on 11/05/2021 without any complications.   Assessment & Plan:   Principal Problem:   Closed fracture of right hip (HCC) Active Problems:   Essential hypertension   Type 2 diabetes mellitus without complication, without long-term current use of insulin (HCC)   Frontal lobe dementia (HCC)  #1 acute right femoral neck fracture secondary to mechanical fall. -Patient presented after unwitnessed fall noted to have a right femoral neck fracture. -Orthopedics consulted and it is noted that her doctor at their consulted overnight spoke with Dr. Jena Gauss on 7/4 regarding need to transfer patient to [facility] for treatment and patient transferred to [facility]. -Patient admitted to [facility], seen in consultation by orthopedics and underwent right hip hemiarthroplasty 11/05/2021 without any complications. -Orthopedics following recommended WBAT, no formal hip precautions, Lovenox for postop DVT prophylaxis x4 weeks, will need outpatient follow-up with orthopedic surgery in 2 weeks for wound check.   -Awaiting PT evaluation, likely needs SNF. -Per orthopedics.   2.  Hypertension -Patient noted to have soft blood pressure earlier on in the mornings during his hospitalization which improved in the afternoons.  -Blood pressure soft this morning -Continue home  regimen lisinopril, Inderal. -Discontinue Norvasc. -Follow.  3.  Diabetes mellitus type 2, well controlled without long-term insulin use, -On admission noted to have a glucose at 219. -Hemoglobin A1c 6.1 (11/03/2021). - CBG 176 this morning. -Patient noted to be on metformin at home prior to admission. -It was found hyperglycemia likely related to acute stress response. -Continue to hold metformin. -SSI.  4.  Vascular dementia/frontal lobe dementia -Noted to be nonverbal at baseline. -Pleasantly confused -Outpatient follow-up.  5.  Low TSH  -TSH noted at 0.171. -Free T4 one 1.21. -Outpatient follow-up.   DVT prophylaxis: Lovenox x4 weeks per orthopedics for postop DVT prophylaxis. Code Status: Full Family Communication: No family at bedside. Disposition: SNF, hopefully in the next 24 to 48 hours  Status is: Inpatient Remains inpatient appropriate because: Severity of illness   Consultants:  Orthopedics: Dr.Marchwiany 11/05/2021  Procedures:  Chest x-ray 11/03/2021 Plain films of the right femur 11/03/2021 Plain films of the pelvis 11/03/2021 Right hip hemiarthroplasty: Per orthopedics, Dr.Marchwiany 11/05/2021  Antimicrobials:  None   Subjective: Sleeping but arousable.  Nonverbal.  Patient noted to have been unable to work with therapy yesterday.  Objective: Vitals:   11/06/21 1855 11/06/21 1900 11/07/21 0618 11/07/21 0700  BP:  (!) 143/75 124/66 (!) 112/32  Pulse: 88 79    Resp: 17 17    Temp:  99.5 F (37.5 C)  97.9 F (36.6 C)  TempSrc:  Axillary  Oral  SpO2: 100% 99%    Weight:      Height:        Intake/Output Summary (Last 24 hours) at 11/07/2021 1248 Last data filed at 11/07/2021 9563 Gross per 24 hour  Intake --  Output 600 ml  Net -600 ml    Filed Weights   11/03/21 1334 11/03/21 1939 11/05/21 1342  Weight: 72.6 kg 64.5 kg 64.5 kg    Examination:  General exam: NAD Respiratory system: Lungs clear to auscultation bilaterally anterior lung  fields.  No wheezes, no crackles, no rhonchi.  Normal respiratory effort.  Speaking in full sentences. Cardiovascular system: RRR no murmurs rubs or gallops.  No JVD.  No lower extremity edema.  Gastrointestinal system: Abdomen is soft, nontender, nondistended, positive bowel sounds.  No rebound.  No guarding.   Central nervous system: Alert. No focal neurological deficits. Extremities: No clubbing cyanosis or edema.   Skin: No rashes, lesions or ulcers Psychiatry: Judgement and insight appear poor.  Mood and affect appropriate.      Data Reviewed: I have personally reviewed following labs and imaging studies  CBC: Recent Labs  Lab 11/03/21 1431 11/04/21 0501 11/05/21 0212 11/06/21 0255 11/07/21 0156  WBC 12.0* 10.0 9.0 6.2 8.9  NEUTROABS 9.8*  --   --  4.5  --   HGB 13.8 13.8 14.0 16.9* 11.1*  HCT 40.4 40.9 41.5 53.0* 32.3*  MCV 86.1 87.8 87.0 91.4 86.4  PLT 235 265 266 162 270     Basic Metabolic Panel: Recent Labs  Lab 11/03/21 1431 11/04/21 0501 11/05/21 0212 11/06/21 0255 11/07/21 0156  NA 137 138 138 139 139  K 3.6 3.6 4.1 4.3 3.5  CL 106 106 102 104 104  CO2 23 24 26 22 25   GLUCOSE 219* 166* 162* 169* 192*  BUN 11 14 15 13 12   CREATININE 0.56 0.60 0.80 0.68 0.65  CALCIUM 8.7* 8.9 8.9 8.5* 8.2*  MG  --   --  2.2  --   --      GFR: Estimated Creatinine Clearance: 71.8 mL/min (by C-G formula based on SCr of 0.65 mg/dL).  Liver Function Tests: No results for input(s): "AST", "ALT", "ALKPHOS", "BILITOT", "PROT", "ALBUMIN" in the last 168 hours.  CBG: Recent Labs  Lab 11/06/21 1154 11/06/21 1715 11/06/21 2134 11/07/21 0754 11/07/21 1136  GLUCAP 161* 170* 180* 176* 172*      Recent Results (from the past 240 hour(s))  Surgical PCR screen     Status: None   Collection Time: 11/05/21  8:19 AM   Specimen: Nasal Mucosa; Nasal Swab  Result Value Ref Range Status   MRSA, PCR NEGATIVE NEGATIVE Final   Staphylococcus aureus NEGATIVE NEGATIVE Final     Comment: (NOTE) The Xpert SA Assay (FDA approved for NASAL specimens in patients 63 years of age and older), is one component of a comprehensive surveillance program. It is not intended to diagnose infection nor to guide or monitor treatment. Performed at [facility], [address], [place], [address] [place]          Radiology Studies: DG HIP UNILAT W OR W/O PELVIS 2-3 VIEWS RIGHT  Result Date: 11/05/2021 CLINICAL DATA:  Status post right hip replacement, initial encounter EXAM: DG HIP (WITH OR WITHOUT PELVIS) 2-3V RIGHT COMPARISON:  11/03/2021 FINDINGS: Pelvic ring is intact. New right hip prosthesis is noted in satisfactory position. No acute abnormality is noted. IMPRESSION: Status post right hip replacement. Electronically Signed   By: 01/06/2022 M.D.   On: 11/05/2021 20:03        Scheduled Meds:  aspirin  81 mg Oral Daily   atorvastatin  40 mg Oral q1800   cholecalciferol  2,000 Units Oral Daily   enoxaparin (LOVENOX)  injection  40 mg Subcutaneous Q24H   insulin aspart  0-6 Units Subcutaneous TID WC   [START ON 11/08/2021] lisinopril  10 mg Oral Daily   mupirocin ointment  1 Application Nasal BID   potassium chloride  10 mEq Oral q morning   propranolol  20 mg Oral BID   senna  1 tablet Oral BID   Continuous Infusions:  lactated ringers 75 mL/hr at 11/07/21 0047     LOS: 4 days    Time spent: 35 minutes    Ramiro Harvest, MD [facility]   To contact the attending provider between 7A-7P or the covering provider during after hours 7P-7A, please log into the web site [email or web address] and access using universal [place] password for that web site. If you do not have the password, please call the hospital operator.  11/07/2021, 12:48 PM

## 2021-11-07 NOTE — Plan of Care (Signed)
  Problem: Clinical Measurements: Goal: Respiratory complications will improve Outcome: Progressing   Problem: Activity: Goal: Risk for activity intolerance will decrease Outcome: Progressing   Problem: Nutrition: Goal: Adequate nutrition will be maintained Outcome: Progressing   Problem: Elimination: Goal: Will not experience complications related to bowel motility Outcome: Progressing   

## 2021-11-07 NOTE — TOC Initial Note (Signed)
Transition of Care [facility]) - Initial/Assessment Note    Patient Details  Name: Monica Ayers MRN: [id number] Date of Birth: 08/16/1960  [facility] of [facility]) CM/SW Contact:    Lorri Frederick, LCSW Phone Number: 11/07/2021, 3:32 PM  Clinical Narrative:    Pt is oriented x0.  CSW spoke with  Nilda Calamity, [facility] DSS/legal guardian.  Pt is resident at [facility], long term care, plan for her to return per guardian.    CSW spoke with Melissa at [facility] and they can accept pt as soon as she is ready, weekend is fine.  Pt has specific [employer] plan where they will submit auth once pt returns, no auth needed prior to DC from the hospital.            Expected Discharge Plan: Skilled Nursing Facility Barriers to Discharge: Continued Medical Work up   Patient Goals and CMS Choice     Choice offered to / list presented to : [facility] POA / Guardian Aldona Lento DSS)  Expected Discharge Plan and Services Expected Discharge Plan: Skilled Nursing Facility In-house Referral: Clinical Social Work   Post Acute Care Choice: Skilled Nursing Facility Living arrangements for the past 2 months: Skilled Nursing Facility                                      Prior Living Arrangements/Services Living arrangements for the past 2 months: Skilled Nursing Facility Lives with:: Facility Resident          Need for Family Participation in Patient Care: Yes (Comment) Care giver support system in place?: Yes (comment) Current home services: Other (comment) (na) Criminal Activity/Legal Involvement Pertinent to Current Situation/Hospitalization: No - Comment as needed  Activities of Daily Living Home Assistive Devices/Equipment: None ADL Screening (condition at time of admission) Patient's cognitive ability adequate to safely complete daily activities?: No Is the patient deaf or have difficulty hearing?: No Does the patient have difficulty seeing, even when  wearing glasses/contacts?: No Does the patient have difficulty concentrating, remembering, or making decisions?: Yes Patient able to express need for assistance with ADLs?: No Does the patient have difficulty dressing or bathing?: Yes Independently performs ADLs?: No Communication: Needs assistance Is this a change from baseline?: Pre-admission baseline Dressing (OT): Needs assistance Is this a change from baseline?: Pre-admission baseline Grooming: Needs assistance Is this a change from baseline?: Pre-admission baseline Feeding: Needs assistance Is this a change from baseline?: Pre-admission baseline Bathing: Needs assistance Is this a change from baseline?: Pre-admission baseline Toileting: Needs assistance Is this a change from baseline?: Pre-admission baseline In/Out Bed: Needs assistance Is this a change from baseline?: Pre-admission baseline Walks in Home: Needs assistance Is this a change from baseline?: Pre-admission baseline Does the patient have difficulty walking or climbing stairs?: Yes Weakness of Legs: Both Weakness of Arms/Hands: None  Permission Sought/Granted                  Emotional Assessment Appearance:: Appears stated age Attitude/Demeanor/Rapport: Unable to Assess Affect (typically observed): Unable to Assess Orientation: :  (not oriented) Alcohol / Substance Use: Not Applicable Psych Involvement: No (comment)  Admission diagnosis:  Closed fracture of right hip (HCC) [S72.001A] Fall, initial encounter [W19.XXXA] Closed fracture of right hip, initial encounter (HCC) [S72.001A] Closed right hip fracture, initial encounter [facility]) [S72.001A] Patient Active Problem List   Diagnosis Date Noted   Closed fracture of  right hip (HCC) 11/03/2021   Hyperlipidemia 05/28/2019   Hypernatremia 05/18/2019   Hyperthyroidism 01/18/2019   Hypokalemia    Unsteady gait 12/13/2018   Falls frequently 12/13/2018   Transaminasemia 11/18/2018   Type 2 diabetes  mellitus without complication, without long-term current use of insulin (HCC) 11/17/2018   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 [place] [address] Phone: [phone] Fax: [phone]     Social Determinants of Health (SDOH) Interventions    Readmission Risk Interventions    05/19/2019    2:29 PM  Readmission Risk Prevention Plan  Transportation Screening Complete  PCP or Specialist Appt within 3-5 Days Not Complete  HRI or Home Care Consult Complete  Social Work Consult for Recovery Care Planning/Counseling Complete  Palliative Care Screening Not Complete  Medication Review [employer]) Complete

## 2021-11-07 NOTE — Plan of Care (Signed)
  Problem: Skin Integrity: Goal: Risk for impaired skin integrity will decrease Outcome: Progressing   Problem: Tissue Perfusion: Goal: Adequacy of tissue perfusion will improve Outcome: Progressing   Problem: Education: Goal: Knowledge of General Education information will improve Description: Including pain rating scale, medication(s)/side effects and non-pharmacologic comfort measures Outcome: Progressing   Problem: Clinical Measurements: Goal: Respiratory complications will improve Outcome: Progressing

## 2021-11-07 NOTE — Evaluation (Signed)
Occupational Therapy Evaluation Patient Details Name: Monica Ayers MRN: [id number] DOB: 1961/02/28 Today's Date: 11/07/2021   History of Present Illness 61 y/o female presented to ED on 11/03/21 following fall at facility. Sustained R femoral neck fx with superior displacement. S/p R hip hemiarthroplasty on 7/5. PMH: dementia, nonverbal, HTN, T2DM   Clinical Impression   PTA, pt resides at a facility, typically ambulatory without a device at baseline and unsure of ADL assist required d/t pt poor historian. Pt presents s/p sx noted above, nonverbal at baseline and unable to follow any directions during session. Pt requires Total A x 2 for bed mobility, Total A to maintain sitting balance with heavy posterior lean and TotaL A for all ADLs with no initiation noted. Recommend pt DC back to facility with therapy follow-up as appropriate. Will follow acutely on trial basis pending pt ability to participate and follow one step directions.     Recommendations for follow up therapy are one component of a multi-disciplinary discharge planning process, led by the attending physician.  Recommendations may be updated based on patient status, additional functional criteria and insurance authorization.   Follow Up Recommendations  Skilled nursing-short term rehab (<3 hours/day)    Assistance Recommended at Discharge Frequent or constant Supervision/Assistance  Patient can return home with the following Two people to help with walking and/or transfers;Two people to help with bathing/dressing/bathroom;Assistance with feeding    Functional Status Assessment  Patient has had a recent decline in their functional status and demonstrates the ability to make significant improvements in function in a reasonable and predictable amount of time.  Equipment Recommendations  None recommended by OT    Recommendations for Other Services       Precautions / Restrictions Precautions Precautions: Fall Precaution  Comments: nonverbal, mittens due to finger biting and oral fixation Restrictions Weight Bearing Restrictions: Yes RLE Weight Bearing: Weight bearing as tolerated      Mobility Bed Mobility Overal bed mobility: Needs Assistance Bed Mobility: Supine to Sit, Sit to Supine     Supine to sit: Total assist, +2 for physical assistance, +2 for safety/equipment Sit to supine: Total assist, +2 for physical assistance, +2 for safety/equipment   General bed mobility comments: totalA+2 for all aspects of bed mobility with no initiaton by patient. Resistance felt with attempts to move patient's arms or legs. Once in sitting, heavy posterior lean requiring totalA to maintain    Transfers                   General transfer comment: deferred due to not following commands and heavy posterior lean in sitting      Balance Overall balance assessment: Needs assistance Sitting-balance support: No upper extremity supported, Feet supported Sitting balance-Leahy Scale: Zero Sitting balance - Comments: totalA to maintain sitting balance                                   ADL either performed or assessed with clinical judgement   ADL Overall ADL's : Needs assistance/impaired Eating/Feeding: Total assistance   Grooming: Total assistance;Bed level Grooming Details (indicate cue type and reason): attempted to engage in washing face with multimodal cues, hand over hand though pt would not initiate Upper Body Bathing: Total assistance   Lower Body Bathing: Total assistance   Upper Body Dressing : Total assistance   Lower Body Dressing: Total assistance       Toileting- Clothing Manipulation  and Hygiene: Total assistance;Bed level Toileting - Clothing Manipulation Details (indicate cue type and reason): soiled w/ urine on arrival from purewick malfunction       General ADL Comments: Total A for all ADLs, poor initiation and heavy posterior lean sitting EOB. unable to engage in  meaningful tasks     Vision Ability to See in Adequate Light: 0 Adequate Patient Visual Report: Other (comment) (pt unable to indicate) Vision Assessment?: No apparent visual deficits     Perception     Praxis      Pertinent Vitals/Pain Pain Assessment Pain Assessment: Faces Faces Pain Scale: No hurt Pain Intervention(s): Monitored during session     Hand Dominance  (unsure)   Extremity/Trunk Assessment Upper Extremity Assessment Upper Extremity Assessment: Difficult to assess due to impaired cognition (active resistance to ROM)   Lower Extremity Assessment Lower Extremity Assessment: Defer to PT evaluation   Cervical / Trunk Assessment Cervical / Trunk Assessment: Kyphotic   Communication Communication Communication: Expressive difficulties;Other (comment) (nonverbal at baseline)   Cognition Arousal/Alertness: Awake/alert Behavior During Therapy: Flat affect Overall Cognitive Status: History of cognitive impairments - at baseline                                 General Comments: hx of dementia and nonverbal at baseline. Not following commands despite multimodal cues. does track stimuli voluntarily. intermittent laughing     General Comments  VSS on RA    Exercises     Shoulder Instructions      Home Living Family/patient expects to be discharged to:: Skilled nursing facility                                 Additional Comments: from memory care      Prior Functioning/Environment Prior Level of Function : Patient poor historian/Family not available             Mobility Comments: unsure of mobility. Per chart, patient ambulatory around facility and at times skipping through facility without need for AD.          OT Problem List: Decreased activity tolerance;Impaired balance (sitting and/or standing);Decreased cognition;Decreased knowledge of use of DME or AE;Decreased safety awareness      OT Treatment/Interventions:  Self-care/ADL training;Therapeutic exercise;Energy conservation;DME and/or AE instruction;Therapeutic activities;Patient/family education;Balance training    OT Goals(Current goals can be found in the care plan section) Acute Rehab OT Goals Patient Stated Goal: unable to state OT Goal Formulation: Patient unable to participate in goal setting Time For Goal Achievement: 11/21/21 Potential to Achieve Goals: Fair  OT Frequency: Min 2X/week    Co-evaluation PT/OT/SLP Co-Evaluation/Treatment: Yes Reason for Co-Treatment: For patient/therapist safety;To address functional/ADL transfers;Necessary to address cognition/behavior during functional activity PT goals addressed during session: Mobility/safety with mobility OT goals addressed during session: ADL's and self-care      AM-PAC OT "6 Clicks" Daily Activity     Outcome Measure Help from another person eating meals?: Total Help from another person taking care of personal grooming?: Total Help from another person toileting, which includes using toliet, bedpan, or urinal?: Total Help from another person bathing (including washing, rinsing, drying)?: Total Help from another person to put on and taking off regular upper body clothing?: Total Help from another person to put on and taking off regular lower body clothing?: Total 6 Click Score: 6   End of  Session Nurse Communication: Mobility status  Activity Tolerance: Other (comment) (limited by cognition) Patient left: in bed;with call bell/phone within reach;with bed alarm set  OT Visit Diagnosis: Other symptoms and signs involving cognitive function;Other abnormalities of gait and mobility (R26.89)                Time: 7829-5621 OT Time Calculation (min): 19 min Charges:  OT General Charges $OT Visit: 1 Visit OT Evaluation $OT Eval Moderate Complexity: 1 Mod  Bradd Canary, OTR/L Acute Rehab Services Office: [phone]   Lorre Munroe 11/07/2021, 11:25 AM

## 2021-11-08 DIAGNOSIS — G3109 Other frontotemporal dementia: Secondary | ICD-10-CM | POA: Diagnosis not present

## 2021-11-08 DIAGNOSIS — I10 Essential (primary) hypertension: Secondary | ICD-10-CM | POA: Diagnosis not present

## 2021-11-08 DIAGNOSIS — S72001P Fracture of unspecified part of neck of right femur, subsequent encounter for closed fracture with malunion: Secondary | ICD-10-CM | POA: Diagnosis not present

## 2021-11-08 DIAGNOSIS — W19XXXA Unspecified fall, initial encounter: Secondary | ICD-10-CM | POA: Diagnosis not present

## 2021-11-08 LAB — BASIC METABOLIC PANEL
Anion gap: 9 (ref 5–15)
BUN: 8 mg/dL (ref 8–23)
CO2: 24 mmol/L (ref 22–32)
Calcium: 8.1 mg/dL — ABNORMAL LOW (ref 8.9–10.3)
Chloride: 103 mmol/L (ref 98–111)
Creatinine, Ser: 0.54 mg/dL (ref 0.44–1.00)
GFR, Estimated: >60 mL/min (ref >60)
Glucose, Bld: 234 mg/dL — ABNORMAL HIGH (ref 70–99)
Potassium: 3.6 mmol/L (ref 3.5–5.1)
Sodium: 136 mmol/L (ref 135–145)

## 2021-11-08 LAB — CBC
HCT: 31.9 % — ABNORMAL LOW (ref 36.0–46.0)
Hemoglobin: 11 g/dL — ABNORMAL LOW (ref 12.0–15.0)
MCH: 29.8 pg (ref 26.0–34.0)
MCHC: 34.5 g/dL (ref 30.0–36.0)
MCV: 86.4 fL (ref 80.0–100.0)
Platelets: 293 10*3/uL (ref 150–400)
RBC: 3.69 MIL/uL — ABNORMAL LOW (ref 3.87–5.11)
RDW: 13.5 % (ref 11.5–15.5)
WBC: 9.5 10*3/uL (ref 4.0–10.5)
nRBC: 0 % (ref 0.0–0.2)

## 2021-11-08 LAB — GLUCOSE, CAPILLARY
Glucose-Capillary: 172 mg/dL — ABNORMAL HIGH (ref 70–99)
Glucose-Capillary: 188 mg/dL — ABNORMAL HIGH (ref 70–99)
Glucose-Capillary: 234 mg/dL — ABNORMAL HIGH (ref 70–99)
Glucose-Capillary: 212 mg/dL — ABNORMAL HIGH (ref 70–99)

## 2021-11-08 MED ORDER — LISINOPRIL 5 MG PO TABS
2.5000 mg | ORAL_TABLET | Freq: Every day | ORAL | Status: DC
  Administered 2021-11-09: 2.5 mg via ORAL
  Filled 2021-11-08: qty 1

## 2021-11-08 MED ORDER — ACETAMINOPHEN 325 MG PO TABS
650.0000 mg | ORAL_TABLET | Freq: Four times a day (QID) | ORAL | Status: DC | PRN
  Administered 2021-11-08 – 2021-11-11 (×3): 650 mg via ORAL
  Filled 2021-11-08 (×3): qty 2

## 2021-11-08 NOTE — Progress Notes (Signed)
PROGRESS NOTE    Monica Ayers  ZOX:[id number] DOB: 1960/10/06 DOA: 11/03/2021 PCP: Lindaann Pascal    Chief Complaint  Patient presents with   Fall    Brief Narrative:  Monica Ayers is a 61 y.o. female with medical history significant of hypertension, diabetes mellitus type 2, hyperthyroidism, and vascular dementia who is nonverbal at baseline who presents after having an unwitnessed fall last night around 10 PM.  She has been noted to have a right femoral neck fracture.  She will need transfer to [facility] for further orthopedic evaluation and treatment.  Patient transferred to [facility] and underwent right hip hemiarthroplasty on 11/05/2021 without any complications.   Assessment & Plan:   Principal Problem:   Closed fracture of right hip (HCC) Active Problems:   Essential hypertension   Type 2 diabetes mellitus without complication, without long-term current use of insulin (HCC)   Frontal lobe dementia (HCC)  #1 acute right femoral neck fracture secondary to mechanical fall. -Patient presented after unwitnessed fall noted to have a right femoral neck fracture. -Orthopedics consulted and it is noted that her doctor at their consulted overnight spoke with Dr. Jena Gauss on 7/4 regarding need to transfer patient to [facility] for treatment and patient transferred to [facility]. -Patient admitted to [facility], seen in consultation by orthopedics and underwent right hip hemiarthroplasty 11/05/2021 without any complications. -Orthopedics following recommended WBAT, no formal hip precautions, Lovenox for postop DVT prophylaxis x4 weeks, will need outpatient follow-up with orthopedic surgery in 2 weeks for wound check.   -Awaiting PT evaluation, likely needs SNF. -Per orthopedics.   2.  Hypertension -Patient noted to have soft blood pressure earlier on in the mornings during his hospitalization which improved in the afternoons.  -Blood pressure soft again this morning.   -Norvasc  discontinued.  -Decrease lisinopril to 2.5 mg daily. -Discontinue Inderal.  3.  Diabetes mellitus type 2, well controlled without long-term insulin use, -On admission noted to have a glucose at 219. -Hemoglobin A1c 6.1 (11/03/2021). - CBG 172 this morning. -Patient noted to be on metformin at home prior to admission. -It was found hyperglycemia likely related to acute stress response. -Continue to hold metformin and resume on discharge. -SSI.  4.  Vascular dementia/frontal lobe dementia -Noted to be nonverbal at baseline. -Outpatient follow-up.  5.  Low TSH  -TSH noted at 0.171. -Free T4 one 1.21. -Outpatient follow-up.   DVT prophylaxis: Lovenox x4 weeks per orthopedics for postop DVT prophylaxis. Code Status: Full Family Communication: No family at bedside. Disposition: SNF, hopefully in the next 24 to 48 hours  Status is: Inpatient Remains inpatient appropriate because: Severity of illness   Consultants:  Orthopedics: Dr.Marchwiany 11/05/2021  Procedures:  Chest x-ray 11/03/2021 Plain films of the right femur 11/03/2021 Plain films of the pelvis 11/03/2021 Right hip hemiarthroplasty: Per orthopedics, Dr.Marchwiany 11/05/2021  Antimicrobials:  None   Subjective: Sleeping but arousable.  Nonverbal.  Objective: Vitals:   11/07/21 2150 11/07/21 2159 11/08/21 0003 11/08/21 0428  BP: (!) 170/91 (!) 179/89 131/70 (!) 115/56  Pulse: 92     Resp: 13     Temp: 98.8 F (37.1 C)   98.6 F (37 C)  TempSrc: Oral   Oral  SpO2: 98%     Weight:      Height:        Intake/Output Summary (Last 24 hours) at 11/08/2021 1342 Last data filed at 11/08/2021 0600 Gross per 24 hour  Intake 3010.28 ml  Output 1400  ml  Net 1610.28 ml    Filed Weights   11/03/21 1334 11/03/21 1939 11/05/21 1342  Weight: 72.6 kg 64.5 kg 64.5 kg    Examination:  General exam: NAD Respiratory system: Clear to auscultation bilaterally anterior lung fields.  No wheezes, no crackles, no rhonchi.   Cardiovascular system: Regular rate rhythm no murmurs rubs or gallops.  No JVD.  No lower extremity edema.  Gastrointestinal system: Abdomen is soft, nontender, nondistended, positive bowel sounds.  No rebound.  No guarding.   Central nervous system: Alert. No focal neurological deficits. Extremities: No clubbing cyanosis or edema.   Skin: No rashes, lesions or ulcers Psychiatry: Judgement and insight appear poor.  Mood and affect appropriate.      Data Reviewed: I have personally reviewed following labs and imaging studies  CBC: Recent Labs  Lab 11/03/21 1431 11/04/21 0501 11/05/21 0212 11/06/21 0255 11/07/21 0156 11/08/21 0156  WBC 12.0* 10.0 9.0 6.2 8.9 9.5  NEUTROABS 9.8*  --   --  4.5  --   --   HGB 13.8 13.8 14.0 16.9* 11.1* 11.0*  HCT 40.4 40.9 41.5 53.0* 32.3* 31.9*  MCV 86.1 87.8 87.0 91.4 86.4 86.4  PLT 235 265 266 162 270 293     Basic Metabolic Panel: Recent Labs  Lab 11/04/21 0501 11/05/21 0212 11/06/21 0255 11/07/21 0156 11/08/21 0156  NA 138 138 139 139 136  K 3.6 4.1 4.3 3.5 3.6  CL 106 102 104 104 103  CO2 24 26 22 25 24   GLUCOSE 166* 162* 169* 192* 234*  BUN 14 15 13 12 8   CREATININE 0.60 0.80 0.68 0.65 0.54  CALCIUM 8.9 8.9 8.5* 8.2* 8.1*  MG  --  2.2  --   --   --      GFR: Estimated Creatinine Clearance: 71.8 mL/min (by C-G formula based on SCr of 0.54 mg/dL).  Liver Function Tests: No results for input(s): "AST", "ALT", "ALKPHOS", "BILITOT", "PROT", "ALBUMIN" in the last 168 hours.  CBG: Recent Labs  Lab 11/07/21 1136 11/07/21 1543 11/07/21 2155 11/08/21 0826 11/08/21 1141  GLUCAP 172* 150* 216* 172* 188*      Recent Results (from the past 240 hour(s))  Surgical PCR screen     Status: None   Collection Time: 11/05/21  8:19 AM   Specimen: Nasal Mucosa; Nasal Swab  Result Value Ref Range Status   MRSA, PCR NEGATIVE NEGATIVE Final   Staphylococcus aureus NEGATIVE NEGATIVE Final    Comment: (NOTE) The Xpert SA Assay (FDA  approved for NASAL specimens in patients 74 years of age and older), is one component of a comprehensive surveillance program. It is not intended to diagnose infection nor to guide or monitor treatment. Performed at [facility], [address], [place], [address] [place]          Radiology Studies: No results found.      Scheduled Meds:  aspirin  81 mg Oral Daily   atorvastatin  40 mg Oral q1800   cholecalciferol  2,000 Units Oral Daily   enoxaparin (LOVENOX) injection  40 mg Subcutaneous Q24H   insulin aspart  0-6 Units Subcutaneous TID WC   [START ON 11/09/2021] lisinopril  2.5 mg Oral Daily   mupirocin ointment  1 Application Nasal BID   potassium chloride  10 mEq Oral q morning   senna  1 tablet Oral BID   Continuous Infusions:     LOS: 5 days    Time spent: 35 minutes  Ramiro Harvest, MD [facility]   To contact the attending provider between 7A-7P or the covering provider during after hours 7P-7A, please log into the web site [email or web address] and access using universal [place] password for that web site. If you do not have the password, please call the hospital operator.  11/08/2021, 1:42 PM

## 2021-11-08 NOTE — Plan of Care (Signed)
  Problem: Nutritional: Goal: Maintenance of adequate nutrition will improve Outcome: Progressing   Problem: Skin Integrity: Goal: Risk for impaired skin integrity will decrease Outcome: Progressing   Problem: Nutrition: Goal: Adequate nutrition will be maintained Outcome: Progressing   Problem: Coping: Goal: Level of anxiety will decrease Outcome: Progressing   Problem: Elimination: Goal: Will not experience complications related to urinary retention Outcome: Progressing

## 2021-11-09 ENCOUNTER — Inpatient Hospital Stay (HOSPITAL_COMMUNITY): Payer: Medicare Other

## 2021-11-09 DIAGNOSIS — S72001P Fracture of unspecified part of neck of right femur, subsequent encounter for closed fracture with malunion: Secondary | ICD-10-CM | POA: Diagnosis not present

## 2021-11-09 DIAGNOSIS — W19XXXA Unspecified fall, initial encounter: Secondary | ICD-10-CM | POA: Diagnosis not present

## 2021-11-09 DIAGNOSIS — G3109 Other frontotemporal dementia: Secondary | ICD-10-CM | POA: Diagnosis not present

## 2021-11-09 DIAGNOSIS — I10 Essential (primary) hypertension: Secondary | ICD-10-CM | POA: Diagnosis not present

## 2021-11-09 LAB — BASIC METABOLIC PANEL
Anion gap: 8 (ref 5–15)
BUN: 14 mg/dL (ref 8–23)
CO2: 23 mmol/L (ref 22–32)
Calcium: 8.2 mg/dL — ABNORMAL LOW (ref 8.9–10.3)
Chloride: 105 mmol/L (ref 98–111)
Creatinine, Ser: 0.61 mg/dL (ref 0.44–1.00)
GFR, Estimated: >60 mL/min (ref >60)
Glucose, Bld: 208 mg/dL — ABNORMAL HIGH (ref 70–99)
Potassium: 3.4 mmol/L — ABNORMAL LOW (ref 3.5–5.1)
Sodium: 136 mmol/L (ref 135–145)

## 2021-11-09 LAB — HEPATIC FUNCTION PANEL
ALT: 15 U/L (ref 0–44)
AST: 16 U/L (ref 15–41)
Albumin: 2.6 g/dL — ABNORMAL LOW (ref 3.5–5.0)
Alkaline Phosphatase: 52 U/L (ref 38–126)
Bilirubin, Direct: <0.1 mg/dL (ref 0.0–0.2)
Total Bilirubin: 0.6 mg/dL (ref 0.3–1.2)
Total Protein: 5.9 g/dL — ABNORMAL LOW (ref 6.5–8.1)

## 2021-11-09 LAB — CBC
HCT: 32.6 % — ABNORMAL LOW (ref 36.0–46.0)
Hemoglobin: 10.9 g/dL — ABNORMAL LOW (ref 12.0–15.0)
MCH: 29.1 pg (ref 26.0–34.0)
MCHC: 33.4 g/dL (ref 30.0–36.0)
MCV: 87.2 fL (ref 80.0–100.0)
Platelets: 324 10*3/uL (ref 150–400)
RBC: 3.74 MIL/uL — ABNORMAL LOW (ref 3.87–5.11)
RDW: 13.4 % (ref 11.5–15.5)
WBC: 8.6 10*3/uL (ref 4.0–10.5)
nRBC: 0.2 % (ref 0.0–0.2)

## 2021-11-09 LAB — GLUCOSE, CAPILLARY
Glucose-Capillary: 182 mg/dL — ABNORMAL HIGH (ref 70–99)
Glucose-Capillary: 199 mg/dL — ABNORMAL HIGH (ref 70–99)
Glucose-Capillary: 256 mg/dL — ABNORMAL HIGH (ref 70–99)
Glucose-Capillary: 225 mg/dL — ABNORMAL HIGH (ref 70–99)

## 2021-11-09 MED ORDER — POTASSIUM CHLORIDE CRYS ER 20 MEQ PO TBCR
40.0000 meq | EXTENDED_RELEASE_TABLET | Freq: Once | ORAL | Status: AC
  Administered 2021-11-09: 40 meq via ORAL
  Filled 2021-11-09: qty 2

## 2021-11-09 MED ORDER — POTASSIUM CHLORIDE CRYS ER 10 MEQ PO TBCR
10.0000 meq | EXTENDED_RELEASE_TABLET | Freq: Every morning | ORAL | Status: DC
  Administered 2021-11-10 – 2021-11-11 (×2): 10 meq via ORAL
  Filled 2021-11-09 (×2): qty 1

## 2021-11-09 NOTE — Progress Notes (Addendum)
PROGRESS NOTE    Monica Ayers  QVZ:[id number] DOB: 22-Jul-1960 DOA: 11/03/2021 PCP: Lindaann Pascal    Chief Complaint  Patient presents with   Fall    Brief Narrative:  Monica Ayers is a 61 y.o. female with medical history significant of hypertension, diabetes mellitus type 2, hyperthyroidism, and vascular dementia who is nonverbal at baseline who presents after having an unwitnessed fall last night around 10 PM.  She has been noted to have a right femoral neck fracture.  She will need transfer to [facility] for further orthopedic evaluation and treatment.  Patient transferred to [facility] and underwent right hip hemiarthroplasty on 11/05/2021 without any complications.   Assessment & Plan:   Principal Problem:   Closed fracture of right hip (HCC) Active Problems:   Essential hypertension   Type 2 diabetes mellitus without complication, without long-term current use of insulin (HCC)   Frontal lobe dementia (HCC)  #1 acute right femoral neck fracture secondary to mechanical fall. -Patient presented after unwitnessed fall noted to have a right femoral neck fracture. -Orthopedics consulted and it is noted that her doctor at their consulted overnight spoke with Dr. Jena Gauss on 7/4 regarding need to transfer patient to [facility] for treatment and patient transferred to [facility]. -Patient admitted to [facility], seen in consultation by orthopedics and underwent right hip hemiarthroplasty 11/05/2021 without any complications. -Orthopedics following recommended WBAT, no formal hip precautions, Lovenox for postop DVT prophylaxis x4 weeks, will need outpatient follow-up with orthopedic surgery in 2 weeks for wound check.   -Awaiting PT evaluation, likely needs SNF. -Per orthopedics.   2.  Hypertension -Patient noted to have soft blood pressure earlier on in the mornings during his hospitalization which improved in the afternoons.  -Blood pressure soft again this morning.   -Norvasc and  Inderal have been discontinued.   -Continue lisinopril 2.5 mg daily.  3.  Diabetes mellitus type 2, well controlled without long-term insulin use, -On admission noted to have a glucose at 219. -Hemoglobin A1c 6.1 (11/03/2021). - CBG 182 this morning. -Patient noted to be on metformin at home prior to admission. -It was found hyperglycemia likely related to acute stress response. -Continue to hold metformin and resume on discharge. -SSI.  4.  Vascular dementia/frontal lobe dementia -Noted to be nonverbal at baseline. -Outpatient follow-up.  5.  Low TSH  -TSH noted at 0.171. -Free T4 one 1.21. -Outpatient follow-up.  6.  Fever -Patient noted to have a temp of 100.5 at 2151 hrs. on 11/08/2021. -Check a UA with cultures and sensitivities. -Chest x-ray. -Follow.  7.  Hypokalemia -Replete   DVT prophylaxis: Lovenox x4 weeks per orthopedics for postop DVT prophylaxis. Code Status: Full Family Communication: No family at bedside. Disposition: SNF, hopefully in the next 24 to 48 hours if remains afebrile.  Status is: Inpatient Remains inpatient appropriate because: Severity of illness   Consultants:  Orthopedics: Dr.Marchwiany 11/05/2021  Procedures:  Chest x-ray 11/03/2021 Plain films of the right femur 11/03/2021 Plain films of the pelvis 11/03/2021 Right hip hemiarthroplasty: Per orthopedics, Dr.Marchwiany 11/05/2021  Antimicrobials:  None   Subjective: Alert.  Nonverbal.  Patient noted to have spiked a temp of 100.5 2151 hours on 11/08/2021.  Objective: Vitals:   11/08/21 2151 11/09/21 0232 11/09/21 0417 11/09/21 0858  BP: (!) 145/72  (!) 173/71 (!) 148/78  Pulse: 91  87 84  Resp: 14  16 16   Temp: (!) 100.5 F (38.1 C) 98.1 F (36.7 C) 97.6 F (36.4 C)  98.2 F (36.8 C)  TempSrc: Oral Axillary Axillary   SpO2: 96%  100% 98%  Weight:      Height:        Intake/Output Summary (Last 24 hours) at 11/09/2021 1254 Last data filed at 11/09/2021 3536 Gross per 24 hour  Intake  240 ml  Output 1400 ml  Net -1160 ml    Filed Weights   11/03/21 1334 11/03/21 1939 11/05/21 1342  Weight: 72.6 kg 64.5 kg 64.5 kg    Examination:  General exam: NAD. Respiratory system: CTA B.  No wheezes, no crackles, no rhonchi.  Cardiovascular system: RRR no murmurs rubs or gallops.  No JVD.  No lower extremity edema. Gastrointestinal system: Abdomen is soft, nontender, nondistended, positive bowel sounds.  No rebound.  No guarding.   Central nervous system: Alert. No focal neurological deficits. Extremities: No clubbing cyanosis or edema.   Skin: No rashes, lesions or ulcers Psychiatry: Judgement and insight appear poor.  Mood and affect appropriate.      Data Reviewed: I have personally reviewed following labs and imaging studies  CBC: Recent Labs  Lab 11/03/21 1431 11/04/21 0501 11/05/21 0212 11/06/21 0255 11/07/21 0156 11/08/21 0156 11/09/21 0146  WBC 12.0*   < > 9.0 6.2 8.9 9.5 8.6  NEUTROABS 9.8*  --   --  4.5  --   --   --   HGB 13.8   < > 14.0 16.9* 11.1* 11.0* 10.9*  HCT 40.4   < > 41.5 53.0* 32.3* 31.9* 32.6*  MCV 86.1   < > 87.0 91.4 86.4 86.4 87.2  PLT 235   < > 266 162 270 293 324   < > = values in this interval not displayed.     Basic Metabolic Panel: Recent Labs  Lab 11/05/21 0212 11/06/21 0255 11/07/21 0156 11/08/21 0156 11/09/21 0146  NA 138 139 139 136 136  K 4.1 4.3 3.5 3.6 3.4*  CL 102 104 104 103 105  CO2 26 22 25 24 23   GLUCOSE 162* 169* 192* 234* 208*  BUN 15 13 12 8 14   CREATININE 0.80 0.68 0.65 0.54 0.61  CALCIUM 8.9 8.5* 8.2* 8.1* 8.2*  MG 2.2  --   --   --   --      GFR: Estimated Creatinine Clearance: 71.8 mL/min (by C-G formula based on SCr of 0.61 mg/dL).  Liver Function Tests: Recent Labs  Lab 11/09/21 0146  AST 16  ALT 15  ALKPHOS 52  BILITOT 0.6  PROT 5.9*  ALBUMIN 2.6*    CBG: Recent Labs  Lab 11/08/21 1141 11/08/21 1718 11/08/21 2112 11/09/21 0849 11/09/21 1121  GLUCAP 188* 234* 212* 182*  199*      Recent Results (from the past 240 hour(s))  Surgical PCR screen     Status: None   Collection Time: 11/05/21  8:19 AM   Specimen: Nasal Mucosa; Nasal Swab  Result Value Ref Range Status   MRSA, PCR NEGATIVE NEGATIVE Final   Staphylococcus aureus NEGATIVE NEGATIVE Final    Comment: (NOTE) The Xpert SA Assay (FDA approved for NASAL specimens in patients 62 years of age and older), is one component of a comprehensive surveillance program. It is not intended to diagnose infection nor to guide or monitor treatment. Performed at [facility], [address] [place], [address] [place]          Radiology Studies: DG CHEST PORT 1 VIEW  Result Date: 11/09/2021 CLINICAL DATA:  Fever. EXAM: PORTABLE  CHEST 1 VIEW COMPARISON:  11/03/2021 FINDINGS: The heart size and mediastinal contours are within normal limits. Both lungs are clear. The visualized skeletal structures are unremarkable. IMPRESSION: No active disease. Electronically Signed   By: Kennith Center M.D.   On: 11/09/2021 11:50        Scheduled Meds:  aspirin  81 mg Oral Daily   atorvastatin  40 mg Oral q1800   cholecalciferol  2,000 Units Oral Daily   enoxaparin (LOVENOX) injection  40 mg Subcutaneous Q24H   insulin aspart  0-6 Units Subcutaneous TID WC   lisinopril  2.5 mg Oral Daily   mupirocin ointment  1 Application Nasal BID   [START ON 11/10/2021] potassium chloride  10 mEq Oral q morning   senna  1 tablet Oral BID   Continuous Infusions:     LOS: 6 days    Time spent: 35 minutes    Ramiro Harvest, MD [facility]   To contact the attending provider between 7A-7P or the covering provider during after hours 7P-7A, please log into the web site [email or web address] and access using universal  password for that web site. If you do not have the password, please call the hospital operator.  11/09/2021, 12:54 PM

## 2021-11-09 NOTE — Progress Notes (Signed)
   11/09/21 1930  Assess: MEWS Score  Temp 97.9 F (36.6 C)  BP (!) 177/116  Pulse Rate (!) 115  Resp 18  Level of Consciousness Alert (non verbal)  SpO2 99 %  O2 Device Room Air  Assess: MEWS Score  MEWS Temp 0  MEWS Systolic 0  MEWS Pulse 2  MEWS RR 0  MEWS LOC 0  MEWS Score 2  MEWS Score Color Yellow  Assess: if the MEWS score is Yellow or Red  Were vital signs taken at a resting state? Yes  Focused Assessment Change from prior assessment (see assessment flowsheet)  Does the patient meet 2 or more of the SIRS criteria? No  MEWS guidelines implemented *See Row Information* Yes  Treat  MEWS Interventions Administered prn meds/treatments  Pain Scale PAINAD  Pain Intervention(s) Medication (See eMAR)  Breathing 0  Negative Vocalization 1  Facial Expression 1  Body Language 2  Consolability 1  PAINAD Score 5  Early Detection of Sepsis Score *See Row Information* Low  Take Vital Signs  Increase Vital Sign Frequency  Yellow: Q 2hr X 2 then Q 4hr X 2, if remains yellow, continue Q 4hrs  Escalate  MEWS: Escalate Yellow: discuss with charge nurse/RN and consider discussing with provider and RRT  Notify: Charge Nurse/RN  Name of Charge Nurse/RN Notified Health visitor  Date Charge Nurse/RN Notified 11/09/21  Time Charge Nurse/RN Notified 2026  Notify: Provider  Provider Name/Title Opyd MD  Date Provider Notified 11/09/21  Method of Notification Page  Provider response No new orders  Date of Provider Response 11/09/21  Document  Patient Outcome Stabilized after interventions (we will continue to monitor.)  Progress note created (see row info) Yes  Assess: SIRS CRITERIA  SIRS Temperature  0  SIRS Pulse 1  SIRS Respirations  0  SIRS WBC 0  SIRS Score Sum  1

## 2021-11-10 DIAGNOSIS — B9620 Unspecified Escherichia coli [E. coli] as the cause of diseases classified elsewhere: Secondary | ICD-10-CM

## 2021-11-10 DIAGNOSIS — G3109 Other frontotemporal dementia: Secondary | ICD-10-CM | POA: Diagnosis not present

## 2021-11-10 DIAGNOSIS — S72001P Fracture of unspecified part of neck of right femur, subsequent encounter for closed fracture with malunion: Secondary | ICD-10-CM | POA: Diagnosis not present

## 2021-11-10 DIAGNOSIS — W19XXXA Unspecified fall, initial encounter: Secondary | ICD-10-CM | POA: Diagnosis not present

## 2021-11-10 DIAGNOSIS — I10 Essential (primary) hypertension: Secondary | ICD-10-CM | POA: Diagnosis not present

## 2021-11-10 DIAGNOSIS — N390 Urinary tract infection, site not specified: Secondary | ICD-10-CM

## 2021-11-10 LAB — GLUCOSE, CAPILLARY
Glucose-Capillary: 156 mg/dL — ABNORMAL HIGH (ref 70–99)
Glucose-Capillary: 312 mg/dL — ABNORMAL HIGH (ref 70–99)
Glucose-Capillary: 237 mg/dL — ABNORMAL HIGH (ref 70–99)
Glucose-Capillary: 171 mg/dL — ABNORMAL HIGH (ref 70–99)
Glucose-Capillary: 213 mg/dL — ABNORMAL HIGH (ref 70–99)

## 2021-11-10 LAB — URINALYSIS, ROUTINE W REFLEX MICROSCOPIC
Bilirubin Urine: NEGATIVE
Glucose, UA: NEGATIVE mg/dL
Ketones, ur: NEGATIVE mg/dL
Nitrite: POSITIVE — AB
Protein, ur: NEGATIVE mg/dL
Specific Gravity, Urine: 1.023 (ref 1.005–1.030)
WBC, UA: >50 WBC/hpf — ABNORMAL HIGH (ref 0–5)
pH: 5 (ref 5.0–8.0)

## 2021-11-10 LAB — CBC WITH DIFFERENTIAL/PLATELET
Abs Immature Granulocytes: 0.03 10*3/uL (ref 0.00–0.07)
Basophils Absolute: 0 10*3/uL (ref 0.0–0.1)
Basophils Relative: 0 %
Eosinophils Absolute: 0.2 10*3/uL (ref 0.0–0.5)
Eosinophils Relative: 2 %
HCT: 33.1 % — ABNORMAL LOW (ref 36.0–46.0)
Hemoglobin: 11.2 g/dL — ABNORMAL LOW (ref 12.0–15.0)
Immature Granulocytes: 0 %
Lymphocytes Relative: 12 %
Lymphs Abs: 1.3 10*3/uL (ref 0.7–4.0)
MCH: 29.2 pg (ref 26.0–34.0)
MCHC: 33.8 g/dL (ref 30.0–36.0)
MCV: 86.4 fL (ref 80.0–100.0)
Monocytes Absolute: 1 10*3/uL (ref 0.1–1.0)
Monocytes Relative: 10 %
Neutro Abs: 8.4 10*3/uL — ABNORMAL HIGH (ref 1.7–7.7)
Neutrophils Relative %: 76 %
Platelets: 386 10*3/uL (ref 150–400)
RBC: 3.83 MIL/uL — ABNORMAL LOW (ref 3.87–5.11)
RDW: 13.5 % (ref 11.5–15.5)
WBC: 11 10*3/uL — ABNORMAL HIGH (ref 4.0–10.5)
nRBC: 0 % (ref 0.0–0.2)

## 2021-11-10 LAB — BASIC METABOLIC PANEL
Anion gap: 14 (ref 5–15)
BUN: 14 mg/dL (ref 8–23)
CO2: 20 mmol/L — ABNORMAL LOW (ref 22–32)
Calcium: 8.7 mg/dL — ABNORMAL LOW (ref 8.9–10.3)
Chloride: 103 mmol/L (ref 98–111)
Creatinine, Ser: 0.47 mg/dL (ref 0.44–1.00)
GFR, Estimated: >60 mL/min (ref >60)
Glucose, Bld: 186 mg/dL — ABNORMAL HIGH (ref 70–99)
Potassium: 3.9 mmol/L (ref 3.5–5.1)
Sodium: 137 mmol/L (ref 135–145)

## 2021-11-10 MED ORDER — INSULIN GLARGINE-YFGN 100 UNIT/ML ~~LOC~~ SOLN
5.0000 [IU] | Freq: Every day | SUBCUTANEOUS | Status: DC
  Administered 2021-11-10 – 2021-11-11 (×2): 5 [IU] via SUBCUTANEOUS
  Filled 2021-11-10 (×2): qty 0.05

## 2021-11-10 MED ORDER — PROPRANOLOL HCL 20 MG PO TABS
20.0000 mg | ORAL_TABLET | Freq: Two times a day (BID) | ORAL | Status: DC
  Administered 2021-11-10 – 2021-11-11 (×3): 20 mg via ORAL
  Filled 2021-11-10 (×4): qty 1

## 2021-11-10 MED ORDER — SODIUM CHLORIDE 0.9 % IV SOLN
2.0000 g | INTRAVENOUS | Status: DC
  Administered 2021-11-10 – 2021-11-11 (×2): 2 g via INTRAVENOUS
  Filled 2021-11-10 (×2): qty 20

## 2021-11-10 NOTE — Progress Notes (Signed)
PROGRESS NOTE    Monica Ayers  HDQ:[id number] DOB: 06-13-1960 DOA: 11/03/2021 PCP: Lindaann Pascal    Chief Complaint  Patient presents with   Fall    Brief Narrative:  Monica Ayers is a 61 y.o. female with medical history significant of hypertension, diabetes mellitus type 2, hyperthyroidism, and vascular dementia who is nonverbal at baseline who presents after having an unwitnessed fall last night around 10 PM.  She has been noted to have a right femoral neck fracture.  She will need transfer to [facility] for further orthopedic evaluation and treatment.  Patient transferred to [facility] and underwent right hip hemiarthroplasty on 11/05/2021 without any complications.   Assessment & Plan:   Principal Problem:   Closed fracture of right hip (HCC) Active Problems:   Essential hypertension   Type 2 diabetes mellitus without complication, without long-term current use of insulin (HCC)   Frontal lobe dementia (HCC)  #1 acute right femoral neck fracture secondary to mechanical fall. -Patient presented after unwitnessed fall noted to have a right femoral neck fracture. -Orthopedics consulted and it is noted that her doctor at their consulted overnight spoke with Dr. Jena Gauss on 7/4 regarding need to transfer patient to [facility] for treatment and patient transferred to [facility]. -Patient admitted to [facility], seen in consultation by orthopedics and underwent right hip hemiarthroplasty 11/05/2021 without any complications. -Orthopedics following recommended WBAT, no formal hip precautions, Lovenox for postop DVT prophylaxis x4 weeks, will need outpatient follow-up with orthopedic surgery in 2 weeks for wound check. -Needs SNF placement. -Per orthopedics.   2.  Hypertension -Patient noted to have soft blood pressure earlier on in the mornings during his hospitalization which improved in the afternoons.  -Blood pressure soft again however patient with some tachycardia.  -Norvasc  and Inderal discontinued.  -Due to tachycardia will resume Inderal and discontinue lisinopril.  -Follow.  3.  Diabetes mellitus type 2, well controlled without long-term insulin use, -On admission noted to have a glucose at 219. -Hemoglobin A1c 6.1 (11/03/2021). - CBG 156 this morning. -Patient noted to be on metformin at home prior to admission. -It was found hyperglycemia likely related to acute stress response. -Continue to hold metformin and resume on discharge. -Start Semglee 5 units daily. -SSI.  4.  Vascular dementia/frontal lobe dementia -Noted to be nonverbal at baseline. -Outpatient follow-up.  5.  Low TSH  -TSH noted at 0.171. -Free T4 one 1.21. -Outpatient follow-up.  6.  Fever/leukocytosis/E. coli UTI -Patient noted to have a temp of 100.5 at 2151 hrs. on 11/08/2021. -Patient noted to have a leukocytosis this morning with white count elevated at 11.0 with a left shift. -Urinalysis ordered yesterday not done however urine cultures from 11/09/2021 with 70,000 colonies of E. coli. -Repeat UA and cultures pending. -Chest x-ray negative for any infiltrate. -Start IV Rocephin pending finalization of culture results. -Follow.  7.  Hypokalemia -Repleted.  Potassium at 3.9.    DVT prophylaxis: Lovenox x4 weeks per orthopedics for postop DVT prophylaxis. Code Status: Full Family Communication: No family at bedside. Disposition: SNF, hopefully in the next 24 to 48 hours if remains afebrile with improvement with leukocytosis and finalization of urine cultures.  Status is: Inpatient Remains inpatient appropriate because: Severity of illness   Consultants:  Orthopedics: Dr.Marchwiany 11/05/2021  Procedures:  Chest x-ray 11/03/2021 Plain films of the right femur 11/03/2021 Plain films of the pelvis 11/03/2021 Right hip hemiarthroplasty: Per orthopedics, Dr.Marchwiany 11/05/2021  Antimicrobials:  IV Rocephin 11/10/2021>>>>>  Subjective: Alert.  Nonverbal.  Fever curve trended  down.  Urinalysis pending.    Objective: Vitals:   11/09/21 2130 11/09/21 2339 11/10/21 0447 11/10/21 0723  BP: (!) 154/75 97/60 (!) 100/58 116/67  Pulse: (!) 114 99 99 (!) 111  Resp: 18 16 17    Temp: (!) 97.4 F (36.3 C) 98 F (36.7 C) 98.2 F (36.8 C) 99 F (37.2 C)  TempSrc: Axillary Axillary  Oral  SpO2: 98% 98% 98% 99%  Weight:      Height:        Intake/Output Summary (Last 24 hours) at 11/10/2021 1107 Last data filed at 11/09/2021 2040 Gross per 24 hour  Intake 480 ml  Output 400 ml  Net 80 ml    Filed Weights   11/03/21 1334 11/03/21 1939 11/05/21 1342  Weight: 72.6 kg 64.5 kg 64.5 kg    Examination:  General exam: NAD. Respiratory system: CTA B anterior lung fields.  No wheezes, no crackles, no rhonchi.  Fair air movement.  Speaking in full sentences. Cardiovascular system: Regular rate rhythm no murmurs rubs or gallops.  No JVD.  No lower extremity edema. Gastrointestinal system: Abdomen is soft, nontender, nondistended, positive bowel sounds.  No rebound.  No guarding.   Central nervous system: Alert. No focal neurological deficits. Extremities: No clubbing cyanosis or edema.   Skin: No rashes, lesions or ulcers Psychiatry: Judgement and insight appear poor.  Mood and affect appropriate.      Data Reviewed: I have personally reviewed following labs and imaging studies  CBC: Recent Labs  Lab 11/03/21 1431 11/04/21 0501 11/06/21 0255 11/07/21 0156 11/08/21 0156 11/09/21 0146 11/10/21 0220  WBC 12.0*   < > 6.2 8.9 9.5 8.6 11.0*  NEUTROABS 9.8*  --  4.5  --   --   --  8.4*  HGB 13.8   < > 16.9* 11.1* 11.0* 10.9* 11.2*  HCT 40.4   < > 53.0* 32.3* 31.9* 32.6* 33.1*  MCV 86.1   < > 91.4 86.4 86.4 87.2 86.4  PLT 235   < > 162 270 293 324 386   < > = values in this interval not displayed.     Basic Metabolic Panel: Recent Labs  Lab 11/05/21 0212 11/06/21 0255 11/07/21 0156 11/08/21 0156 11/09/21 0146 11/10/21 0220  NA 138 139 139 136 136 137   K 4.1 4.3 3.5 3.6 3.4* 3.9  CL 102 104 104 103 105 103  CO2 26 22 25 24 23  20*  GLUCOSE 162* 169* 192* 234* 208* 186*  BUN 15 13 12 8 14 14   CREATININE 0.80 0.68 0.65 0.54 0.61 0.47  CALCIUM 8.9 8.5* 8.2* 8.1* 8.2* 8.7*  MG 2.2  --   --   --   --   --      GFR: Estimated Creatinine Clearance: 71.8 mL/min (by C-G formula based on SCr of 0.47 mg/dL).  Liver Function Tests: Recent Labs  Lab 11/09/21 0146  AST 16  ALT 15  ALKPHOS 52  BILITOT 0.6  PROT 5.9*  ALBUMIN 2.6*     CBG: Recent Labs  Lab 11/09/21 0849 11/09/21 1121 11/09/21 1610 11/09/21 1930 11/10/21 0723  GLUCAP 182* 199* 256* 225* 156*      Recent Results (from the past 240 hour(s))  Surgical PCR screen     Status: None   Collection Time: 11/05/21  8:19 AM   Specimen: Nasal Mucosa; Nasal Swab  Result Value Ref Range Status   MRSA, PCR NEGATIVE NEGATIVE Final  Staphylococcus aureus NEGATIVE NEGATIVE Final    Comment: (NOTE) The Xpert SA Assay (FDA approved for NASAL specimens in patients 42 years of age and older), is one component of a comprehensive surveillance program. It is not intended to diagnose infection nor to guide or monitor treatment. Performed at [facility], [address] [place], [place] [address]   Urine Culture     Status: Abnormal (Preliminary result)   Collection Time: 11/09/21  9:03 AM   Specimen: Urine, Catheterized  Result Value Ref Range Status   Specimen Description URINE, CATHETERIZED  Final   Special Requests NONE  Final   Culture (A)  Final    70,000 COLONIES/mL GRAM NEGATIVE RODS SUSCEPTIBILITIES TO FOLLOW CULTURE REINCUBATED FOR BETTER GROWTH Performed at [facility], [address], [place], [place] [address]    Report Status PENDING  Incomplete         Radiology Studies: DG CHEST PORT 1 VIEW  Result Date: 11/09/2021 CLINICAL DATA:  Fever. EXAM: PORTABLE CHEST 1 VIEW COMPARISON:  11/03/2021 FINDINGS: The heart size and mediastinal  contours are within normal limits. Both lungs are clear. The visualized skeletal structures are unremarkable. IMPRESSION: No active disease. Electronically Signed   By: Kennith Center M.D.   On: 11/09/2021 11:50        Scheduled Meds:  aspirin  81 mg Oral Daily   atorvastatin  40 mg Oral q1800   cholecalciferol  2,000 Units Oral Daily   enoxaparin (LOVENOX) injection  40 mg Subcutaneous Q24H   insulin aspart  0-6 Units Subcutaneous TID WC   potassium chloride  10 mEq Oral q morning   propranolol  20 mg Oral BID   senna  1 tablet Oral BID   Continuous Infusions:     LOS: 7 days    Time spent: 35 minutes    Ramiro Harvest, MD [facility]   To contact the attending provider between 7A-7P or the covering provider during after hours 7P-7A, please log into the web site [email or web address] and access using universal [place] password for that web site. If you do not have the password, please call the hospital operator.  11/10/2021, 11:07 AM

## 2021-11-10 NOTE — Plan of Care (Signed)

## 2021-11-10 NOTE — Progress Notes (Signed)
     Subjective: Patient lying comfortably in bed. Had just got a bath. She had been alert and awake but very tired now. Has soft restraints on, nursing reports that she was biting her fingers when mitts were off. No new issues.  Objective:   VITALS:   Vitals:   11/09/21 2130 11/09/21 2339 11/10/21 0447 11/10/21 0723  BP: (!) 154/75 97/60 (!) 100/58 116/67  Pulse: (!) 114 99 99 (!) 111  Resp: 18 16 17    Temp: (!) 97.4 F (36.3 C) 98 F (36.7 C) 98.2 F (36.8 C) 99 F (37.2 C)  TempSrc: Axillary Axillary  Oral  SpO2: 98% 98% 98% 99%  Weight:      Height:        Sensation intact distally Intact pulses distally Dorsiflexion/Plantar flexion intact Incision: dressing C/D/I No cellulitis present   Lab Results  Component Value Date   WBC 11.0 (H) 11/10/2021   HGB 11.2 (L) 11/10/2021   HCT 33.1 (L) 11/10/2021   MCV 86.4 11/10/2021   PLT 386 11/10/2021   BMET    Component Value Date/Time   NA 137 11/10/2021 0220   K 3.9 11/10/2021 0220   CL 103 11/10/2021 0220   CO2 20 (L) 11/10/2021 0220   GLUCOSE 186 (H) 11/10/2021 0220   BUN 14 11/10/2021 0220   CREATININE 0.47 11/10/2021 0220   CREATININE 0.62 01/16/2019 1112   CALCIUM 8.7 (L) 11/10/2021 0220   GFRNONAA >60 11/10/2021 0220   GFRNONAA 99 01/16/2019 1112      Xray: hemi components in good position without adverse features  Assessment/Plan: 5 Days Post-Op   Principal Problem:   Closed fracture of right hip (HCC) Active Problems:   Essential hypertension   Type 2 diabetes mellitus without complication, without long-term current use of insulin (HCC)   Frontal lobe dementia (HCC)  S/p R hip hemi for 7/5  Post op recs: WB: WBAT no formal hip precuations Abx: ancef x23 hours post op Imaging: PACU xrays Dressing: Aquacel dressing to be kept intact until follow-up DVT prophylaxis: lovenox starting POD1 x4 weeks Follow up: 2 weeks after surgery for a wound check with Dr. 9/5 at [facility].  Address: [address], [place], [place]  Office Phone: [phone]    Sianna Garofano A Buddie Marston 11/10/2021, 1:12 PM   01/11/2022, MD  Contact information:   [phone] 7am-5pm epic message Dr. [email or web address], or call office for patient follow up: [phone] After hours and holidays please check [email or web address] for group call information for [facility]

## 2021-11-11 DIAGNOSIS — B9620 Unspecified Escherichia coli [E. coli] as the cause of diseases classified elsewhere: Secondary | ICD-10-CM | POA: Clinically undetermined

## 2021-11-11 DIAGNOSIS — R509 Fever, unspecified: Secondary | ICD-10-CM | POA: Diagnosis not present

## 2021-11-11 DIAGNOSIS — S72001A Fracture of unspecified part of neck of right femur, initial encounter for closed fracture: Secondary | ICD-10-CM

## 2021-11-11 LAB — BASIC METABOLIC PANEL
Anion gap: 12 (ref 5–15)
BUN: 20 mg/dL (ref 8–23)
CO2: 25 mmol/L (ref 22–32)
Calcium: 8.7 mg/dL — ABNORMAL LOW (ref 8.9–10.3)
Chloride: 105 mmol/L (ref 98–111)
Creatinine, Ser: 0.54 mg/dL (ref 0.44–1.00)
GFR, Estimated: >60 mL/min (ref >60)
Glucose, Bld: 153 mg/dL — ABNORMAL HIGH (ref 70–99)
Potassium: 4.2 mmol/L (ref 3.5–5.1)
Sodium: 142 mmol/L (ref 135–145)

## 2021-11-11 LAB — URINE CULTURE: Culture: 70000 — AB

## 2021-11-11 LAB — CBC WITH DIFFERENTIAL/PLATELET
Abs Immature Granulocytes: 0.04 10*3/uL (ref 0.00–0.07)
Basophils Absolute: 0 10*3/uL (ref 0.0–0.1)
Basophils Relative: 0 %
Eosinophils Absolute: 0.4 10*3/uL (ref 0.0–0.5)
Eosinophils Relative: 5 %
HCT: 31.4 % — ABNORMAL LOW (ref 36.0–46.0)
Hemoglobin: 10.4 g/dL — ABNORMAL LOW (ref 12.0–15.0)
Immature Granulocytes: 0 %
Lymphocytes Relative: 27 %
Lymphs Abs: 2.6 10*3/uL (ref 0.7–4.0)
MCH: 29.1 pg (ref 26.0–34.0)
MCHC: 33.1 g/dL (ref 30.0–36.0)
MCV: 88 fL (ref 80.0–100.0)
Monocytes Absolute: 1 10*3/uL (ref 0.1–1.0)
Monocytes Relative: 10 %
Neutro Abs: 5.7 10*3/uL (ref 1.7–7.7)
Neutrophils Relative %: 58 %
Platelets: 391 10*3/uL (ref 150–400)
RBC: 3.57 MIL/uL — ABNORMAL LOW (ref 3.87–5.11)
RDW: 13.6 % (ref 11.5–15.5)
WBC: 9.9 10*3/uL (ref 4.0–10.5)
nRBC: 0 % (ref 0.0–0.2)

## 2021-11-11 LAB — GLUCOSE, CAPILLARY
Glucose-Capillary: 154 mg/dL — ABNORMAL HIGH (ref 70–99)
Glucose-Capillary: 226 mg/dL — ABNORMAL HIGH (ref 70–99)
Glucose-Capillary: 195 mg/dL — ABNORMAL HIGH (ref 70–99)

## 2021-11-11 MED ORDER — ENOXAPARIN SODIUM 40 MG/0.4ML IJ SOSY: 40.0000 mg | PREFILLED_SYRINGE | INTRAMUSCULAR | 0 refills | Status: DC

## 2021-11-11 MED ORDER — SENNA 8.6 MG PO TABS: 1.0000 | ORAL_TABLET | Freq: Two times a day (BID) | ORAL | 0 refills | Status: DC

## 2021-11-11 MED ORDER — HYDROCODONE-ACETAMINOPHEN 5-325 MG PO TABS: 1.0000 | ORAL_TABLET | Freq: Four times a day (QID) | ORAL | 0 refills | Status: DC | PRN

## 2021-11-11 MED ORDER — CEFPODOXIME PROXETIL 200 MG PO TABS: 200.0000 mg | ORAL_TABLET | Freq: Two times a day (BID) | ORAL | 0 refills | Status: AC

## 2021-11-11 MED ORDER — ATORVASTATIN CALCIUM 40 MG PO TABS: 40.0000 mg | ORAL_TABLET | Freq: Every day | ORAL | 1 refills | Status: DC

## 2021-11-11 NOTE — Progress Notes (Signed)
Nilda Calamity (Legal Guardian) was successfully notified of patients discharge to [facility] by this nurse.

## 2021-11-11 NOTE — Discharge Summary (Signed)
Physician Discharge Summary  Monica Ayers DQQ:[id number] DOB: Jul 13, 1960 DOA: 11/03/2021  PCP: Lindaann Pascal  Admit date: 11/03/2021 Discharge date: 11/11/2021  Time spent: 55 minutes  Recommendations for Outpatient Follow-up:  Patient be discharged to skilled nursing facility.  Follow-up with MD at skilled nursing facility.  Patient will need a basic metabolic profile done in 1 week to follow-up on electrolytes and renal function.  Patient's blood pressure will need to be followed up upon. Follow-up with Dr. Blanchie Dessert in 2 weeks.   Discharge Diagnoses:  Principal Problem:   Closed fracture of right hip (HCC) Active Problems:   Essential hypertension   Type 2 diabetes mellitus without complication, without long-term current use of insulin (HCC)   Frontal lobe dementia (HCC)   Fever   E. coli UTI   Closed right hip fracture, initial encounter [facility])   Discharge Condition: Stable and improved  Diet recommendation: Carb modified diet  Filed Weights   11/03/21 1334 11/03/21 1939 11/05/21 1342  Weight: 72.6 kg 64.5 kg 64.5 kg    History of present illness:  HPI per Monica Ayers is a 61 y.o. female with medical history significant of hypertension, diabetes mellitus type 2, hyperthyroidism, and vascular dementia who is nonverbal at baseline who presents after having an unwitnessed fall last night around 10 PM.  History is obtained from review of records as it is documented that patient does not appear to understand spoken words.  She was assisted back to her bed after the fall and noted to be unable to walk this morning by staff.  It was reported that the patient normally ambulates without assistance.   On admission to the emergency department patient was noted to have a temperature of 100 F with blood pressure 157/74 to 164/85, and all other vital signs maintained.  Labs significant for WBC 12, calcium 8.7, and glucose 219.  X-rays revealed acute right femoral neck  fracture with superior displacement and varus angulation at the fracture site.  Dr. Susa Simmonds of orthopedics was consulted.  Patient had been given fentanyl 50 mcg IV.  Hospital Course:  #1 acute right femoral neck fracture secondary to mechanical fall. -Patient presented after unwitnessed fall noted to have a right femoral neck fracture. -Orthopedics consulted and it is noted that her doctor at their consulted overnight spoke with Dr. Jena Gauss on 7/4 regarding need to transfer patient to [facility] for treatment and patient transferred to [facility]. -Patient admitted to [facility], seen in consultation by orthopedics and underwent right hip hemiarthroplasty 11/05/2021 without any complications. -Orthopedics following recommended WBAT, no formal hip precautions, Lovenox for postop DVT prophylaxis x4 weeks, will need outpatient follow-up with orthopedic surgery in 2 weeks for wound check. -Patient seen by PT/OT who recommended SNF placement. -Patient will be discharged to skilled nursing facility.  2.  Hypertension -Patient noted to have soft blood pressure earlier on in the mornings during his hospitalization which improved in the afternoons.  -Blood pressure soft again however patient with some tachycardia.  -Patient initially placed on home regimen of Norvasc, lisinopril and Inderal however due to soft blood pressures these have been discontinued on discharge.   -Outpatient follow-up.  3.  Diabetes mellitus type 2, well controlled without long-term insulin use, -On admission noted to have a glucose at 219. -Hemoglobin A1c 6.1 (11/03/2021). -Patient noted to be on metformin at home prior to admission. -It was found hyperglycemia likely related to acute stress response. -Patient's oral hypoglycemic agents were held during  the hospitalization and patient maintained on sliding scale insulin as well as Semglee.   -Oral hypoglycemic agents will be resumed on discharge.   4.  Vascular dementia/frontal  lobe dementia -Noted to be nonverbal at baseline. -Outpatient follow-up.  5.  Low TSH  -TSH noted at 0.171. -Free T4 one 1.21. -Outpatient follow-up.  6.  Fever/leukocytosis/E. coli UTI -Patient noted to have a temp of 100.5 at 2151 hrs. on 11/08/2021. -Patient noted to have a leukocytosis. -Urinalysis ordered consistent with a UTI, urine cultures from 11/09/2021 with 70,000 colonies of E. coli. -Patient started on IV Rocephin with clinical improvement. -Patient remained afebrile, leukocytosis trended down and had resolved by day of discharge. -Patient with discharge on 3 more days of oral Vantin to complete a 5-day course of antibiotic treatment. -Outpatient follow-up.  7.  Hypokalemia -Repleted.     Assessment and Plan: No notes have been filed under this hospital service. Service: Hospitalist       Procedures: Chest x-ray 11/03/2021 Plain films of the right femur 11/03/2021 Plain films of the pelvis 11/03/2021 Right hip hemiarthroplasty: Per orthopedics, MonicaMarchwiany 11/05/2021    Consultations: Orthopedics: MonicaMarchwiany 11/05/2021  Discharge Exam: Vitals:   11/11/21 0740 11/11/21 1432  BP: 106/67 106/63  Pulse: 77 91  Resp: 17 17  Temp: 97.8 F (36.6 C) 98.4 F (36.9 C)  SpO2: 92% 94%    General: NAD Cardiovascular: Regular rate rhythm no murmurs rubs or gallops.  No JVD.  No lower extremity edema. Respiratory: Clear to auscultation bilaterally.  No wheezes, no crackles, no rhonchi.  Discharge Instructions   Discharge Instructions     Diet Carb Modified   Complete by: As directed    Increase activity slowly   Complete by: As directed    No wound care   Complete by: As directed       Allergies as of 11/11/2021       Reactions   Latex Rash        Medication List     STOP taking these medications    amLODipine 5 MG tablet Commonly known as: NORVASC   lisinopril 10 MG tablet Commonly known as: ZESTRIL   propranolol 20 MG tablet Commonly  known as: INDERAL       TAKE these medications    acetaminophen 325 MG tablet Commonly known as: TYLENOL Take 2 tablets (650 mg total) by mouth every 6 (six) hours as needed for mild pain, fever or headache (or Fever >/= 101).   aspirin 81 MG chewable tablet Chew 81 mg by mouth daily. What changed: Another medication with the same name was removed. Continue taking this medication, and follow the directions you see here.   atorvastatin 40 MG tablet Commonly known as: LIPITOR Take 1 tablet (40 mg total) by mouth daily at 6 PM.   cefpodoxime 200 MG tablet Commonly known as: VANTIN Take 1 tablet (200 mg total) by mouth 2 (two) times daily for 3 days. Start taking on: November 12, 2021   cholecalciferol 25 MCG (1000 UNIT) tablet Commonly known as: VITAMIN D3 Take 2,000 Units by mouth daily.   enoxaparin 40 MG/0.4ML injection Commonly known as: LOVENOX Inject 0.4 mLs (40 mg total) into the skin daily.   HYDROcodone-acetaminophen 5-325 MG tablet Commonly known as: NORCO/VICODIN Take 1-2 tablets by mouth every 6 (six) hours as needed for moderate pain.   melatonin 3 MG Tabs tablet Take 9 mg by mouth at bedtime.   metFORMIN 500 MG tablet Commonly known as: GLUCOPHAGE  Take 1 tablet (500 mg total) by mouth daily. What changed: when to take this   potassium chloride 10 MEQ tablet Commonly known as: KLOR-CON Take 1 tablet (10 mEq total) by mouth daily. What changed: when to take this   senna 8.6 MG Tabs tablet Commonly known as: SENOKOT Take 1 tablet (8.6 mg total) by mouth 2 (two) times daily.       Allergies  Allergen Reactions   Latex Rash    Follow-up Information     Joen Laura, MD Follow up in 2 week(s).   Specialty: Orthopedic Surgery Contact information: [address] [place] [address] [phone]         MD AT SNF Follow up.                   The results of significant diagnostics from this hospitalization (including  imaging, microbiology, ancillary and laboratory) are listed below for reference.    Significant Diagnostic Studies: DG CHEST PORT 1 VIEW  Result Date: 11/09/2021 CLINICAL DATA:  Fever. EXAM: PORTABLE CHEST 1 VIEW COMPARISON:  11/03/2021 FINDINGS: The heart size and mediastinal contours are within normal limits. Both lungs are clear. The visualized skeletal structures are unremarkable. IMPRESSION: No active disease. Electronically Signed   By: Kennith Center M.D.   On: 11/09/2021 11:50   DG HIP UNILAT W OR W/O PELVIS 2-3 VIEWS RIGHT  Result Date: 11/05/2021 CLINICAL DATA:  Status post right hip replacement, initial encounter EXAM: DG HIP (WITH OR WITHOUT PELVIS) 2-3V RIGHT COMPARISON:  11/03/2021 FINDINGS: Pelvic ring is intact. New right hip prosthesis is noted in satisfactory position. No acute abnormality is noted. IMPRESSION: Status post right hip replacement. Electronically Signed   By: Alcide Clever M.D.   On: 11/05/2021 20:03   DG Chest 1 View  Result Date: 11/03/2021 CLINICAL DATA:  Larey Seat last night. EXAM: CHEST  1 VIEW COMPARISON:  Chest x-ray dated May 18, 2019. FINDINGS: The heart size and mediastinal contours are within normal limits. Normal pulmonary vascularity. No focal consolidation, pleural effusion, or pneumothorax. No acute osseous abnormality. Prior left distal clavicle resection again noted. IMPRESSION: 1. No active disease. Electronically Signed   By: Obie Dredge M.D.   On: 11/03/2021 14:26   DG Femur Min 2 Views Right  Result Date: 11/03/2021 CLINICAL DATA:  Fall, right leg pain EXAM: PELVIS - 1-2 VIEW; RIGHT FEMUR 2 VIEWS COMPARISON:  None Available. FINDINGS: Acute right femoral neck fracture with superior displacement and varus angulation at the fracture site. Hip joint alignment is maintained without dislocation. Pelvic bony ring intact. Distal right femur intact. No appreciable soft tissue abnormality. IMPRESSION: Acute right femoral neck fracture with superior  displacement and varus angulation at the fracture site. Electronically Signed   By: Duanne Guess D.O.   On: 11/03/2021 14:19   DG Pelvis 1-2 Views  Result Date: 11/03/2021 CLINICAL DATA:  Fall, right leg pain EXAM: PELVIS - 1-2 VIEW; RIGHT FEMUR 2 VIEWS COMPARISON:  None Available. FINDINGS: Acute right femoral neck fracture with superior displacement and varus angulation at the fracture site. Hip joint alignment is maintained without dislocation. Pelvic bony ring intact. Distal right femur intact. No appreciable soft tissue abnormality. IMPRESSION: Acute right femoral neck fracture with superior displacement and varus angulation at the fracture site. Electronically Signed   By: Duanne Guess D.O.   On: 11/03/2021 14:19    Microbiology: Recent Results (from the past 240 hour(s))  Surgical PCR screen  Status: None   Collection Time: 11/05/21  8:19 AM   Specimen: Nasal Mucosa; Nasal Swab  Result Value Ref Range Status   MRSA, PCR NEGATIVE NEGATIVE Final   Staphylococcus aureus NEGATIVE NEGATIVE Final    Comment: (NOTE) The Xpert SA Assay (FDA approved for NASAL specimens in patients 75 years of age and older), is one component of a comprehensive surveillance program. It is not intended to diagnose infection nor to guide or monitor treatment. Performed at [facility], [address], [place], [place] [address]   Urine Culture     Status: Abnormal   Collection Time: 11/09/21  9:03 AM   Specimen: Urine, Catheterized  Result Value Ref Range Status   Specimen Description URINE, CATHETERIZED  Final   Special Requests   Final    NONE Performed at [facility], [address], [place], [place] [address]    Culture 70,000 COLONIES/mL ESCHERICHIA COLI (A)  Final   Report Status 11/11/2021 FINAL  Final   Organism ID, Bacteria ESCHERICHIA COLI (A)  Final      Susceptibility   Escherichia coli - MIC*    AMPICILLIN >=32 RESISTANT Resistant     CEFAZOLIN 16 SENSITIVE  Sensitive     CEFEPIME <=0.12 SENSITIVE Sensitive     CEFTRIAXONE <=0.25 SENSITIVE Sensitive     CIPROFLOXACIN <=0.25 SENSITIVE Sensitive     GENTAMICIN <=1 SENSITIVE Sensitive     IMIPENEM <=0.25 SENSITIVE Sensitive     NITROFURANTOIN <=16 SENSITIVE Sensitive     TRIMETH/SULFA <=20 SENSITIVE Sensitive     AMPICILLIN/SULBACTAM >=32 RESISTANT Resistant     PIP/TAZO <=4 SENSITIVE Sensitive     * 70,000 COLONIES/mL ESCHERICHIA COLI  Urine Culture     Status: Abnormal (Preliminary result)   Collection Time: 11/10/21 11:16 AM   Specimen: Urine, Catheterized  Result Value Ref Range Status   Specimen Description URINE, CATHETERIZED  Final   Special Requests NONE  Final   Culture (A)  Final    >=100,000 COLONIES/mL ESCHERICHIA COLI SUSCEPTIBILITIES TO FOLLOW Performed at [facility], [address] [place], [place] [address]    Report Status PENDING  Incomplete     Labs: Basic Metabolic Panel: Recent Labs  Lab 11/05/21 0212 11/06/21 0255 11/07/21 0156 11/08/21 0156 11/09/21 0146 11/10/21 0220 11/11/21 0224  NA 138   < > 139 136 136 137 142  K 4.1   < > 3.5 3.6 3.4* 3.9 4.2  CL 102   < > 104 103 105 103 105  CO2 26   < > 25 24 23  20* 25  GLUCOSE 162*   < > 192* 234* 208* 186* 153*  BUN 15   < > 12 8 14 14 20   CREATININE 0.80   < > 0.65 0.54 0.61 0.47 0.54  CALCIUM 8.9   < > 8.2* 8.1* 8.2* 8.7* 8.7*  MG 2.2  --   --   --   --   --   --    < > = values in this interval not displayed.   Liver Function Tests: Recent Labs  Lab 11/09/21 0146  AST 16  ALT 15  ALKPHOS 52  BILITOT 0.6  PROT 5.9*  ALBUMIN 2.6*   No results for input(s): "LIPASE", "AMYLASE" in the last 168 hours. No results for input(s): "AMMONIA" in the last 168 hours. CBC: Recent Labs  Lab 11/06/21 0255 11/07/21 0156 11/08/21 0156 11/09/21 0146 11/10/21 0220 11/11/21 0224  WBC 6.2 8.9  9.5 8.6 11.0* 9.9  NEUTROABS 4.5  --   --   --  8.4* 5.7  HGB 16.9* 11.1* 11.0* 10.9* 11.2* 10.4*  HCT  53.0* 32.3* 31.9* 32.6* 33.1* 31.4*  MCV 91.4 86.4 86.4 87.2 86.4 88.0  PLT 162 270 293 324 386 391   Cardiac Enzymes: No results for input(s): "CKTOTAL", "CKMB", "CKMBINDEX", "TROPONINI" in the last 168 hours. BNP: BNP (last 3 results) No results for input(s): "BNP" in the last 8760 hours.  ProBNP (last 3 results) No results for input(s): "PROBNP" in the last 8760 hours.  CBG: Recent Labs  Lab 11/10/21 1645 11/10/21 2029 11/10/21 2316 11/11/21 0737 11/11/21 1115  GLUCAP 237* 171* 213* 154* 226*       Signed:  Ramiro Harvestaniel Milca Sytsma MD.  [facility] 11/11/2021, 3:34 PM

## 2021-11-11 NOTE — Progress Notes (Signed)
Physical Therapy Treatment Patient Details Name: Monica Ayers MRN: [id number] DOB: 10-12-1960 Today's Date: 11/11/2021   History of Present Illness 61 y/o female presented to ED on 11/03/21 following fall at facility. Sustained R femoral neck fx with superior displacement. S/p R hip hemiarthroplasty on 7/5. PMH: dementia, nonverbal, HTN, T2DM    PT Comments    Continuing work on functional mobility and activity tolerance;  Session focused on employing Maximove for safe OOB to recliner transfers, and assessing if this is a viable option for mobilizing pt OOB; Kept the carriage part of the Maximove at an angle that kep pt near "supine" in the sling pad during the transition OOB; this help keep a lot of pressure off of her R hip/LE, and overall pt tolerated the process of getting OOB well   Recommendations for follow up therapy are one component of a multi-disciplinary discharge planning process, led by the attending physician.  Recommendations may be updated based on patient status, additional functional criteria and insurance authorization.  Follow Up Recommendations  Skilled nursing-short term rehab (<3 hours/day) Can patient physically be transported by private vehicle: No   Assistance Recommended at Discharge Frequent or constant Supervision/Assistance  Patient can return home with the following Two people to help with walking and/or transfers;Two people to help with bathing/dressing/bathroom   Equipment Recommendations  Rolling walker (2 wheels);BSC/3in1;Wheelchair (measurements PT);Wheelchair cushion (measurements PT);Hospital bed    Recommendations for Other Services       Precautions / Restrictions Precautions Precautions: Fall Precaution Comments: nonverbal, mittens due to finger biting and oral fixation Restrictions RLE Weight Bearing: Weight bearing as tolerated     Mobility  Bed Mobility Overal bed mobility: Needs Assistance Bed Mobility: Rolling Rolling: Total  assist         General bed mobility comments: Total assist to roll L and R for Maximove pad placement    Transfers Overall transfer level: Needs assistance Equipment used: Ambulation equipment used Transfers: Bed to chair/wheelchair/BSC             General transfer comment: Used dependent lift to help pt OOB to recliner; Kept the angle of her hammock/pad in near "supine" position to minimize pressure on her surgical hip during the transfer; tolerated well, and was relaxed and near asleep at the end of session Transfer via Lift Equipment: Maximove  Ambulation/Gait                   Stairs             Wheelchair Mobility    Modified Rankin (Stroke Patients Only)       Balance                                            Cognition Arousal/Alertness: Awake/alert Behavior During Therapy: Flat affect Overall Cognitive Status: History of cognitive impairments - at baseline                                 General Comments: hx of dementia and nonverbal at baseline. Not following commands despite multimodal cues. does track stimuli voluntarily. intermittent laughing        Exercises  AAROM R hip with Max assist, but some active hip motion detected; Hip flex/extend with heel slides x 10 reps    General Comments General  comments (skin integrity, edema, etc.): Session conducted on room air; O2 sats 98%, HR 74 bpm, BP in recliner, feet up, 103/59      Pertinent Vitals/Pain Pain Assessment Pain Assessment: Faces Faces Pain Scale: Hurts a little bit Pain Location: Occasional grimace with RLE ROM Pain Descriptors / Indicators: Grimacing Pain Intervention(s): Monitored during session, Repositioned    Home Living                          Prior Function            PT Goals (current goals can now be found in the care plan section) Acute Rehab PT Goals Patient Stated Goal: unable to state PT Goal Formulation:  Patient unable to participate in goal setting Time For Goal Achievement: 11/21/21 Potential to Achieve Goals: Fair Progress towards PT goals: Progressing toward goals    Frequency    Min 2X/week      PT Plan Current plan remains appropriate    Co-evaluation              AM-PAC PT "6 Clicks" Mobility   Outcome Measure  Help needed turning from your back to your side while in a flat bed without using bedrails?: Total Help needed moving from lying on your back to sitting on the side of a flat bed without using bedrails?: Total Help needed moving to and from a bed to a chair (including a wheelchair)?: Total Help needed standing up from a chair using your arms (e.g., wheelchair or bedside chair)?: Total Help needed to walk in hospital room?: Total Help needed climbing 3-5 steps with a railing? : Total 6 Click Score: 6    End of Session Equipment Utilized During Treatment: Other (comment) (Maximove) Activity Tolerance: Patient tolerated treatment well Patient left: in chair;with call bell/phone within reach;with chair alarm set Nurse Communication: Mobility status;Other (comment);Need for lift equipment (and to keep maximove sling in near "supine" angle during transfer) PT Visit Diagnosis: Muscle weakness (generalized) (M62.81);Unsteadiness on feet (R26.81);History of falling (Z91.81)     Time: 1030-1106 PT Time Calculation (min) (ACUTE ONLY): 36 min  Charges:  $Therapeutic Activity: 23-37 mins                     Van Clines, PT  Acute Rehabilitation Services Office [phone]    Levi Aland 11/11/2021, 2:25 PM

## 2021-11-11 NOTE — Progress Notes (Signed)
Attempted x1 to contact RN at [facility], unsuccessful.

## 2021-11-11 NOTE — TOC Transition Note (Signed)
Transition of Care [facility]) - CM/SW Discharge Note   Patient Details  Name: Monica Ayers MRN: [id number] Date of Birth: 03-20-1961  [facility] of [facility] CM/SW Contact:  Lorri Frederick, LCSW Phone Number: 11/11/2021, 3:46 PM   Clinical Narrative:   Pt discharging to [facility]. RN call [phone] for report.     Final next level of care: Skilled Nursing Facility Barriers to Discharge: Barriers Resolved   Patient Goals and CMS Choice     Choice offered to / list presented to : [facility] POA / Guardian Aldona Lento DSS)  Discharge Placement              Patient chooses bed at:  [facility]) Patient to be transferred to facility by: [facility] Name of family member notified: Nilda Calamity, DSS legal guardian Patient and family notified of of transfer: 11/11/21  Discharge Plan and Services In-house Referral: Clinical Social Work   Post Acute Care Choice: Skilled Nursing Facility                               Social Determinants of Health (SDOH) Interventions     Readmission Risk Interventions    05/19/2019    2:29 PM  Readmission Risk Prevention Plan  Transportation Screening Complete  PCP or Specialist Appt within 3-5 Days Not Complete  HRI or Home Care Consult Complete  Social Work Consult for Recovery Care Planning/Counseling Complete  Palliative Care Screening Not Complete  Medication Review Oceanographer) Complete

## 2021-11-12 LAB — URINE CULTURE: Culture: >=100000 — AB

## 2023-05-10 ENCOUNTER — Emergency Department (HOSPITAL_COMMUNITY): Payer: Medicare Other

## 2023-05-10 ENCOUNTER — Encounter (HOSPITAL_COMMUNITY): Payer: Self-pay

## 2023-05-10 ENCOUNTER — Inpatient Hospital Stay (HOSPITAL_COMMUNITY)
Admission: EM | Admit: 2023-05-10 | Discharge: 2023-05-12 | DRG: 853 | Disposition: A | Discharge status: Skilled Nursing Facility | Payer: Medicare Other | Source: Skilled Nursing Facility | Attending: Internal Medicine | Admitting: Internal Medicine

## 2023-05-10 ENCOUNTER — Other Ambulatory Visit: Payer: Self-pay

## 2023-05-10 DIAGNOSIS — Z91040 Latex allergy status: Secondary | ICD-10-CM | POA: Diagnosis not present

## 2023-05-10 DIAGNOSIS — T797XXA Traumatic subcutaneous emphysema, initial encounter: Secondary | ICD-10-CM

## 2023-05-10 DIAGNOSIS — Z87891 Personal history of nicotine dependence: Secondary | ICD-10-CM

## 2023-05-10 DIAGNOSIS — Z79899 Other long term (current) drug therapy: Secondary | ICD-10-CM | POA: Diagnosis not present

## 2023-05-10 DIAGNOSIS — Z794 Long term (current) use of insulin: Secondary | ICD-10-CM | POA: Diagnosis not present

## 2023-05-10 DIAGNOSIS — A419 Sepsis, unspecified organism: Secondary | ICD-10-CM | POA: Diagnosis present

## 2023-05-10 DIAGNOSIS — E785 Hyperlipidemia, unspecified: Secondary | ICD-10-CM | POA: Diagnosis present

## 2023-05-10 DIAGNOSIS — E039 Hypothyroidism, unspecified: Secondary | ICD-10-CM | POA: Diagnosis present

## 2023-05-10 DIAGNOSIS — L03311 Cellulitis of abdominal wall: Secondary | ICD-10-CM | POA: Diagnosis present

## 2023-05-10 DIAGNOSIS — R532 Functional quadriplegia: Secondary | ICD-10-CM | POA: Diagnosis present

## 2023-05-10 DIAGNOSIS — E860 Dehydration: Secondary | ICD-10-CM | POA: Diagnosis present

## 2023-05-10 DIAGNOSIS — I10 Essential (primary) hypertension: Secondary | ICD-10-CM | POA: Diagnosis present

## 2023-05-10 DIAGNOSIS — E876 Hypokalemia: Secondary | ICD-10-CM | POA: Diagnosis present

## 2023-05-10 DIAGNOSIS — J982 Interstitial emphysema: Secondary | ICD-10-CM | POA: Diagnosis present

## 2023-05-10 DIAGNOSIS — L0390 Cellulitis, unspecified: Secondary | ICD-10-CM

## 2023-05-10 DIAGNOSIS — G3109 Other frontotemporal dementia: Secondary | ICD-10-CM | POA: Diagnosis present

## 2023-05-10 DIAGNOSIS — F0280 Dementia in other diseases classified elsewhere without behavioral disturbance: Secondary | ICD-10-CM | POA: Diagnosis present

## 2023-05-10 DIAGNOSIS — E119 Type 2 diabetes mellitus without complications: Secondary | ICD-10-CM | POA: Diagnosis present

## 2023-05-10 DIAGNOSIS — R6510 Systemic inflammatory response syndrome (SIRS) of non-infectious origin without acute organ dysfunction: Secondary | ICD-10-CM | POA: Diagnosis present

## 2023-05-10 DIAGNOSIS — Z152 Genetic susceptibility to obesity: Secondary | ICD-10-CM | POA: Diagnosis not present

## 2023-05-10 DIAGNOSIS — Z96641 Presence of right artificial hip joint: Secondary | ICD-10-CM | POA: Diagnosis present

## 2023-05-10 DIAGNOSIS — Z7984 Long term (current) use of oral hypoglycemic drugs: Secondary | ICD-10-CM | POA: Diagnosis not present

## 2023-05-10 DIAGNOSIS — T797XXD Traumatic subcutaneous emphysema, subsequent encounter: Secondary | ICD-10-CM | POA: Diagnosis not present

## 2023-05-10 LAB — CBC WITH DIFFERENTIAL/PLATELET
Abs Immature Granulocytes: 0.06 10*3/uL (ref 0.00–0.07)
Basophils Absolute: 0 10*3/uL (ref 0.0–0.1)
Basophils Relative: 0 %
Eosinophils Absolute: 0.1 10*3/uL (ref 0.0–0.5)
Eosinophils Relative: 1 %
HCT: 48.3 % — ABNORMAL HIGH (ref 36.0–46.0)
Hemoglobin: 15.5 g/dL — ABNORMAL HIGH (ref 12.0–15.0)
Immature Granulocytes: 1 %
Lymphocytes Relative: 18 %
Lymphs Abs: 2.3 10*3/uL (ref 0.7–4.0)
MCH: 28.8 pg (ref 26.0–34.0)
MCHC: 32.1 g/dL (ref 30.0–36.0)
MCV: 89.6 fL (ref 80.0–100.0)
Monocytes Absolute: 0.8 10*3/uL (ref 0.1–1.0)
Monocytes Relative: 6 %
Neutro Abs: 9.1 10*3/uL — ABNORMAL HIGH (ref 1.7–7.7)
Neutrophils Relative %: 74 %
Platelets: 342 10*3/uL (ref 150–400)
RBC: 5.39 MIL/uL — ABNORMAL HIGH (ref 3.87–5.11)
RDW: 14.2 % (ref 11.5–15.5)
WBC: 12.4 10*3/uL — ABNORMAL HIGH (ref 4.0–10.5)
nRBC: 0 % (ref 0.0–0.2)

## 2023-05-10 LAB — URINALYSIS, W/ REFLEX TO CULTURE (INFECTION SUSPECTED)
Bacteria, UA: NONE SEEN
Bilirubin Urine: NEGATIVE
Glucose, UA: 50 mg/dL — AB
Hgb urine dipstick: NEGATIVE
Ketones, ur: NEGATIVE mg/dL
Leukocytes,Ua: NEGATIVE
Nitrite: NEGATIVE
Protein, ur: 100 mg/dL — AB
Specific Gravity, Urine: >1.046 — ABNORMAL HIGH (ref 1.005–1.030)
pH: 5 (ref 5.0–8.0)

## 2023-05-10 LAB — PROTIME-INR
INR: 1.1 (ref 0.8–1.2)
Prothrombin Time: 14.2 s (ref 11.4–15.2)

## 2023-05-10 LAB — COMPREHENSIVE METABOLIC PANEL
ALT: 16 U/L (ref 0–44)
AST: 17 U/L (ref 15–41)
Albumin: 3.4 g/dL — ABNORMAL LOW (ref 3.5–5.0)
Alkaline Phosphatase: 56 U/L (ref 38–126)
Anion gap: 11 (ref 5–15)
BUN: 28 mg/dL — ABNORMAL HIGH (ref 8–23)
CO2: 26 mmol/L (ref 22–32)
Calcium: 9.2 mg/dL (ref 8.9–10.3)
Chloride: 105 mmol/L (ref 98–111)
Creatinine, Ser: 0.62 mg/dL (ref 0.44–1.00)
GFR, Estimated: >60 mL/min (ref >60)
Glucose, Bld: 211 mg/dL — ABNORMAL HIGH (ref 70–99)
Potassium: 4.2 mmol/L (ref 3.5–5.1)
Sodium: 142 mmol/L (ref 135–145)
Total Bilirubin: 0.5 mg/dL (ref 0.0–1.2)
Total Protein: 7.3 g/dL (ref 6.5–8.1)

## 2023-05-10 LAB — CBG MONITORING, ED: Glucose-Capillary: 163 mg/dL — ABNORMAL HIGH (ref 70–99)

## 2023-05-10 LAB — RESP PANEL BY RT-PCR (RSV, FLU A&B, COVID)  RVPGX2
Influenza A by PCR: NEGATIVE
Influenza B by PCR: NEGATIVE
Resp Syncytial Virus by PCR: NEGATIVE
SARS Coronavirus 2 by RT PCR: NEGATIVE

## 2023-05-10 LAB — LACTIC ACID, PLASMA: Lactic Acid, Venous: 1.7 mmol/L (ref 0.5–1.9)

## 2023-05-10 LAB — GLUCOSE, CAPILLARY: Glucose-Capillary: 227 mg/dL — ABNORMAL HIGH (ref 70–99)

## 2023-05-10 LAB — APTT: aPTT: 25 s (ref 24–36)

## 2023-05-10 MED ORDER — SODIUM CHLORIDE 0.9 % IV SOLN
2.0000 g | Freq: Once | INTRAVENOUS | Status: AC
  Administered 2023-05-10: 2 g via INTRAVENOUS
  Filled 2023-05-10: qty 20

## 2023-05-10 MED ORDER — ATORVASTATIN CALCIUM 40 MG PO TABS
40.0000 mg | ORAL_TABLET | Freq: Every day | ORAL | Status: DC
  Administered 2023-05-10 – 2023-05-11 (×2): 40 mg via ORAL
  Filled 2023-05-10 (×2): qty 1

## 2023-05-10 MED ORDER — LISINOPRIL 10 MG PO TABS
10.0000 mg | ORAL_TABLET | Freq: Every day | ORAL | Status: DC
  Administered 2023-05-11 – 2023-05-12 (×2): 10 mg via ORAL
  Filled 2023-05-10: qty 1
  Filled 2023-05-10: qty 2
  Filled 2023-05-10: qty 1

## 2023-05-10 MED ORDER — VANCOMYCIN HCL 750 MG/150ML IV SOLN
750.0000 mg | Freq: Two times a day (BID) | INTRAVENOUS | Status: DC
  Administered 2023-05-10: 750 mg via INTRAVENOUS
  Filled 2023-05-10 (×3): qty 150

## 2023-05-10 MED ORDER — ACETAMINOPHEN 650 MG RE SUPP
650.0000 mg | Freq: Four times a day (QID) | RECTAL | Status: DC | PRN
Start: 2023-05-10 — End: 2023-05-12

## 2023-05-10 MED ORDER — SODIUM CHLORIDE 0.9 % IV SOLN
2.0000 g | Freq: Three times a day (TID) | INTRAVENOUS | Status: DC
  Administered 2023-05-10 – 2023-05-11 (×3): 2 g via INTRAVENOUS
  Filled 2023-05-10 (×3): qty 12.5

## 2023-05-10 MED ORDER — MELATONIN 3 MG PO TABS
9.0000 mg | ORAL_TABLET | Freq: Every day | ORAL | Status: DC
  Administered 2023-05-10 – 2023-05-11 (×2): 9 mg via ORAL
  Filled 2023-05-10 (×2): qty 3

## 2023-05-10 MED ORDER — SODIUM CHLORIDE 0.9 % IV SOLN
2.0000 g | Freq: Once | INTRAVENOUS | Status: AC
  Administered 2023-05-10: 2 g via INTRAVENOUS
  Filled 2023-05-10: qty 12.5

## 2023-05-10 MED ORDER — VANCOMYCIN HCL 1250 MG/250ML IV SOLN
1250.0000 mg | Freq: Once | INTRAVENOUS | Status: AC
  Administered 2023-05-10: 1250 mg via INTRAVENOUS
  Filled 2023-05-10: qty 250

## 2023-05-10 MED ORDER — METOPROLOL TARTRATE 25 MG PO TABS: 25.0000 mg | ORAL_TABLET | ORAL | Status: DC

## 2023-05-10 MED ORDER — VANCOMYCIN HCL IN DEXTROSE 1-5 GM/200ML-% IV SOLN: 1000.0000 mg | Freq: Once | INTRAVENOUS | Status: DC

## 2023-05-10 MED ORDER — ENOXAPARIN SODIUM 40 MG/0.4ML IJ SOSY
40.0000 mg | PREFILLED_SYRINGE | INTRAMUSCULAR | Status: DC
  Administered 2023-05-10 – 2023-05-11 (×2): 40 mg via SUBCUTANEOUS
  Filled 2023-05-10 (×2): qty 0.4

## 2023-05-10 MED ORDER — ACETAMINOPHEN 325 MG PO TABS
650.0000 mg | ORAL_TABLET | Freq: Four times a day (QID) | ORAL | Status: DC | PRN
  Administered 2023-05-12: 650 mg via ORAL
  Filled 2023-05-10: qty 2

## 2023-05-10 MED ORDER — ONDANSETRON HCL 4 MG PO TABS: 4.0000 mg | ORAL_TABLET | Freq: Four times a day (QID) | ORAL | Status: DC | PRN

## 2023-05-10 MED ORDER — ONDANSETRON HCL 4 MG/2ML IJ SOLN: 4.0000 mg | Freq: Four times a day (QID) | INTRAMUSCULAR | Status: DC | PRN

## 2023-05-10 MED ORDER — SODIUM CHLORIDE 0.9 % IV SOLN: 2.0000 g | Freq: Once | INTRAVENOUS | Status: DC

## 2023-05-10 MED ORDER — INSULIN ASPART 100 UNIT/ML IJ SOLN
0.0000 [IU] | Freq: Every day | INTRAMUSCULAR | Status: DC
  Administered 2023-05-10: 2 [IU] via SUBCUTANEOUS

## 2023-05-10 MED ORDER — CLINDAMYCIN PHOSPHATE 600 MG/50ML IV SOLN
600.0000 mg | Freq: Once | INTRAVENOUS | Status: AC
  Administered 2023-05-10: 600 mg via INTRAVENOUS
  Filled 2023-05-10: qty 50

## 2023-05-10 MED ORDER — LIDOCAINE HCL (PF) 1 % IJ SOLN
10.0000 mL | Freq: Once | INTRAMUSCULAR | Status: AC
  Administered 2023-05-10: 10 mL
  Filled 2023-05-10: qty 10

## 2023-05-10 MED ORDER — SACCHAROMYCES BOULARDII 250 MG PO CAPS
250.0000 mg | ORAL_CAPSULE | Freq: Two times a day (BID) | ORAL | Status: DC
  Administered 2023-05-10 – 2023-05-12 (×4): 250 mg via ORAL
  Filled 2023-05-10 (×4): qty 1

## 2023-05-10 MED ORDER — LACTATED RINGERS IV SOLN: INTRAVENOUS | Status: AC

## 2023-05-10 MED ORDER — IOHEXOL 300 MG/ML  SOLN
100.0000 mL | Freq: Once | INTRAMUSCULAR | Status: AC | PRN
  Administered 2023-05-10: 100 mL via INTRAVENOUS

## 2023-05-10 MED ORDER — INSULIN ASPART 100 UNIT/ML IJ SOLN
0.0000 [IU] | Freq: Three times a day (TID) | INTRAMUSCULAR | Status: DC
  Administered 2023-05-10: 2 [IU] via SUBCUTANEOUS
  Administered 2023-05-11: 1 [IU] via SUBCUTANEOUS
  Administered 2023-05-11: 3 [IU] via SUBCUTANEOUS
  Administered 2023-05-11 – 2023-05-12 (×2): 2 [IU] via SUBCUTANEOUS
  Administered 2023-05-12: 5 [IU] via SUBCUTANEOUS

## 2023-05-10 NOTE — Progress Notes (Addendum)
 Pharmacy Antibiotic Note  Monica Ayers is a 63 y.o. female admitted on 05/10/2023 with  wound infection .  Pharmacy has been consulted for Vancomycin  dosing. CT shows cellulitis in abdominal with gas and possible early necrotizing fascitis.   Plan: Vancomycin  1250mg  IV loading dose then 750 mg IV Q 12 hrs. Goal AUC 400-550. Expected AUC: 511 SCr used: 0.8(actual 0.62)  F/u cxs and clinical progress Monitor V/S, labs and levels as indicated  Height: 5' 7 (170.2 cm) Weight: 64.5 kg (142 lb 3.2 oz) IBW/kg (Calculated) : 61.6  Temp (24hrs), Avg:100.3 F (37.9 C), Min:100.3 F (37.9 C), Max:100.3 F (37.9 C)  Recent Labs  Lab 05/10/23 1002  WBC 12.4*  CREATININE 0.62  LATICACIDVEN 1.7    Estimated Creatinine Clearance: 70.9 mL/min (by C-G formula based on SCr of 0.62 mg/dL).    Allergies  Allergen Reactions   Latex Rash    Antimicrobials this admission: Cefepime , Clindamycin  and ceftriaxone  1/6 x 1 dose in ED Vancomycin  1/6 >>    Microbiology results: 1/6 BCx: pending  MRSA PCR:   Thank you for allowing pharmacy to be a part of this patient's care.  Jacari Iannello, BS Pharm D, BCPS Clinical Pharmacist 05/10/2023 11:57 AM

## 2023-05-10 NOTE — ED Notes (Signed)
 This NT fed pt her dinner tray. Pt ate apple sauce, mashed potatoes, and 3/4 of chicken pot pie.

## 2023-05-10 NOTE — Consult Note (Addendum)
 [facility] Consult  Reason for Consult: Concern for abdominal wall necrotizing soft tissue infection Referring Physician: Wonda Simpers, PA  Chief Complaint   Fever     HPI: Monica Ayers is a 63 y.o. female who presented from [facility] assisted living facility with a low-grade fever.  Patient is baseline nonverbal, so all history was obtained via EMR.  She had apparently had an intermittent illness since 04/30/2023 and since then she had not been eating very well.  She was diagnosed with a UTI and was given 2 g of Rocephin  in addition to 7 days of Bactrim which she just completed yesterday.  Urine culture did grow out Klebsiella.  She was doing better at her facility until yesterday, when she had a low-grade fever and more difficulty with eating.  Upon discussing with nursing staff, patient was receiving subcutaneous fluids in the left side of her abdomen, as she was difficult IV access.  In the ED, she was noted to have a mild leukocytosis of 12.4.  She has no hyponatremia, mild elevation in her glucose at 111, and a negative UA.  She underwent a CT of the abdomen and pelvis which demonstrated extensive edema and subcutaneous gas in the anterior left lower abdominal wall and pelvic wall with some overlying skin thickening in the same region, compatible with cellulitis and concerning for necrotizing infection, no rim-enhancing fluid collection to suggest abscess.  Past Medical History:  Diagnosis Date   Allergic Ayers 10/02/2016   Allergy    Dementia (HCC)    Diabetes mellitus without complication (HCC)    Hypertension    Hyperthyroidism    Lactic acidosis 11/17/2018   Pressure injury of skin 11/18/2018   Sepsis (HCC) 12/28/2018   Sepsis due to undetermined organism (HCC) 11/17/2018    Past Surgical History:  Procedure Laterality Date   FRACTURE SURGERY     HIP ARTHROPLASTY Right 11/05/2021   Procedure: RIGHT HIP HEMI ARTHROPLASTY;  Surgeon: Edna Toribio LABOR,  MD;  Location: [facility] OR;  Service: Orthopedics;  Laterality: Right;   KNEE SURGERY Right    NECK SURGERY     SHOULDER SURGERY Left     History reviewed. No pertinent family history.  Social History   Tobacco Use   Smoking status: Former    Types: Cigarettes   Smokeless tobacco: Never  Vaping Use   Vaping status: Never Used  Substance Use Topics   Alcohol use: No   Drug use: No    Medications: I have reviewed the patient's current medications.  Allergies  Allergen Reactions   Latex Rash     ROS:  Unable to obtain secondary to patient mentation  Blood pressure 136/89, pulse 97, temperature 100.3 F (37.9 C), temperature source Rectal, resp. rate 19, height 5' 7 (1.702 m), weight 64.5 kg, SpO2 95%. Physical Exam Vitals reviewed.  Constitutional:      General: She is not in acute distress.    Appearance: Normal appearance. She is not ill-appearing.     Comments: Awake, nonverbal  HENT:     Head: Normocephalic and atraumatic.  Eyes:     Extraocular Movements: Extraocular movements intact.     Pupils: Pupils are equal, round, and reactive to light.  Cardiovascular:     Rate and Rhythm: Normal rate.  Pulmonary:     Effort: Pulmonary effort is normal.  Abdominal:     Comments: Abdomen soft, nondistended, no percussion tenderness, nontender to palpation; no rigidity, guarding, or rebound tenderness; no abdominal wall  erythema or induration to suggest cellulitis or necrotizing soft tissue infection, palpable crepitus in left side of abdomen  Skin:    General: Skin is warm and dry.  Neurological:     Mental Status: Mental status is at baseline.     Results: Results for orders placed or performed during the hospital encounter of 05/10/23 (from the past 48 hours)  Urinalysis, w/ Reflex to Culture (Infection Suspected) -Urine, Clean Catch     Status: Abnormal   Collection Time: 05/10/23  9:48 AM  Result Value Ref Range   Specimen Source URINE, CLEAN CATCH    Color,  Urine YELLOW YELLOW   APPearance CLEAR CLEAR   Specific Gravity, Urine >1.046 (H) 1.005 - 1.030   pH 5.0 5.0 - 8.0   Glucose, UA 50 (A) NEGATIVE mg/dL   Hgb urine dipstick NEGATIVE NEGATIVE   Bilirubin Urine NEGATIVE NEGATIVE   Ketones, ur NEGATIVE NEGATIVE mg/dL   Protein, ur 899 (A) NEGATIVE mg/dL   Nitrite NEGATIVE NEGATIVE   Leukocytes,Ua NEGATIVE NEGATIVE   RBC / HPF 0-5 0 - 5 RBC/hpf   WBC, UA 0-5 0 - 5 WBC/hpf    Comment:        Reflex urine culture not performed if WBC <=10, OR if Squamous epithelial cells >5. If Squamous epithelial cells >5 suggest recollection.    Bacteria, UA NONE SEEN NONE SEEN   Squamous Epithelial / HPF 0-5 0 - 5 /HPF   Mucus PRESENT     Comment: Performed at [facility], [address], [place], [place] [address]  Lactic acid, plasma     Status: None   Collection Time: 05/10/23 10:02 AM  Result Value Ref Range   Lactic Acid, Venous 1.7 0.5 - 1.9 mmol/L    Comment: Performed at [facility], [address], [place], [place] [address]  Comprehensive metabolic panel     Status: Abnormal   Collection Time: 05/10/23 10:02 AM  Result Value Ref Range   Sodium 142 135 - 145 mmol/L   Potassium 4.2 3.5 - 5.1 mmol/L   Chloride 105 98 - 111 mmol/L   CO2 26 22 - 32 mmol/L   Glucose, Bld 211 (H) 70 - 99 mg/dL    Comment: Glucose reference range applies only to samples taken after fasting for at least 8 hours.   BUN 28 (H) 8 - 23 mg/dL   Creatinine, Ser 9.37 0.44 - 1.00 mg/dL   Calcium  9.2 8.9 - 10.3 mg/dL   Total Protein 7.3 6.5 - 8.1 g/dL   Albumin 3.4 (L) 3.5 - 5.0 g/dL   AST 17 15 - 41 U/L   ALT 16 0 - 44 U/L   Alkaline Phosphatase 56 38 - 126 U/L   Total Bilirubin 0.5 0.0 - 1.2 mg/dL   GFR, Estimated >39 >39 mL/min    Comment: (NOTE) Calculated using the CKD-EPI Creatinine Equation (2021)    Anion gap 11 5 - 15    Comment: Performed at [facility], [address], [place], [place] [address]  CBC with Differential     Status: Abnormal    Collection Time: 05/10/23 10:02 AM  Result Value Ref Range   WBC 12.4 (H) 4.0 - 10.5 K/uL   RBC 5.39 (H) 3.87 - 5.11 MIL/uL   Hemoglobin 15.5 (H) 12.0 - 15.0 g/dL   HCT 51.6 (H) 63.9 - 53.9 %   MCV 89.6 80.0 - 100.0 fL   MCH 28.8 26.0 - 34.0 pg   MCHC 32.1 30.0 -  36.0 g/dL   RDW 85.7 88.4 - 84.4 %   Platelets 342 150 - 400 K/uL   nRBC 0.0 0.0 - 0.2 %   Neutrophils Relative % 74 %   Neutro Abs 9.1 (H) 1.7 - 7.7 K/uL   Lymphocytes Relative 18 %   Lymphs Abs 2.3 0.7 - 4.0 K/uL   Monocytes Relative 6 %   Monocytes Absolute 0.8 0.1 - 1.0 K/uL   Eosinophils Relative 1 %   Eosinophils Absolute 0.1 0.0 - 0.5 K/uL   Basophils Relative 0 %   Basophils Absolute 0.0 0.0 - 0.1 K/uL   Immature Granulocytes 1 %   Abs Immature Granulocytes 0.06 0.00 - 0.07 K/uL    Comment: Performed at [facility], [address] [place], [place] [address]  Protime-INR     Status: None   Collection Time: 05/10/23 10:02 AM  Result Value Ref Range   Prothrombin Time 14.2 11.4 - 15.2 seconds   INR 1.1 0.8 - 1.2    Comment: (NOTE) INR goal varies based on device and disease states. Performed at [facility], [address], [place], [place] [address]   APTT     Status: None   Collection Time: 05/10/23 10:02 AM  Result Value Ref Range   aPTT 25 24 - 36 seconds    Comment: Performed at [facility], [address] [place], [place] [address]    CT ABDOMEN PELVIS W CONTRAST Result Date: 05/10/2023 CLINICAL DATA:  Fever with abdominal pain. EXAM: CT ABDOMEN AND PELVIS WITH CONTRAST TECHNIQUE: Multidetector CT imaging of the abdomen and pelvis was performed using the standard protocol following bolus administration of intravenous contrast. RADIATION DOSE REDUCTION: This exam was performed according to the departmental dose-optimization program which includes automated exposure control, adjustment of the mA and/or kV according to patient size and/or use of iterative reconstruction technique. CONTRAST:  OMNIPAQUE   IOHEXOL  300 MG/ML  SOLN COMPARISON:  None Available. FINDINGS: Lower chest: Dependent atelectasis noted left lower lobe. 6 mm left lower lobe pulmonary nodule identified on image 5/series 3. Hepatobiliary: No suspicious focal abnormality within the liver parenchyma. Layering sludge or noncalcified stones identified in the gallbladder. No gallbladder wall thickening or pericholecystic fluid evident. No intrahepatic or extrahepatic biliary dilation. Pancreas: No focal mass lesion. No dilatation of the main duct. No intraparenchymal cyst. No peripancreatic edema. Spleen: No splenomegaly. No suspicious focal mass lesion. Adrenals/Urinary Tract: No adrenal nodule or mass. 11 mm hypodensity upper pole right kidney cannot be definitively characterized but is statistically most likely a cyst. No followup imaging is recommended. Left kidney unremarkable. No evidence for hydroureter. The urinary bladder appears normal for the degree of distention. Stomach/Bowel: Stomach is unremarkable. No gastric wall thickening. No evidence of outlet obstruction. Duodenum is normally positioned as is the ligament of Treitz. No small bowel wall thickening. No small bowel dilatation. The terminal ileum is normal. The appendix is normal. No gross colonic mass. Focal wall thickening with luminal narrowing in the proximal rectum (axial 71/2) appears smooth in symmetric, most likely peristalsis. Given the large volume of stool distal to this location it does not appear obstructive. Vascular/Lymphatic: There is mild atherosclerotic calcification of the abdominal aorta without aneurysm. There is no gastrohepatic or hepatoduodenal ligament lymphadenopathy. No retroperitoneal or mesenteric lymphadenopathy. No pelvic sidewall lymphadenopathy. Reproductive: The uterus is unremarkable.  There is no adnexal mass. Other: No intraperitoneal free fluid. Musculoskeletal: Extensive edema in subcutaneous gas is identified in the anterior left lower abdominal  and pelvic wall.  There is some overlying skin thickening in the same region. No focal or rim enhancing fluid collection to suggest subcutaneous abscess. IMPRESSION: 1. Extensive edema and subcutaneous gas in the anterior left lower abdominal and pelvic wall with some overlying skin thickening in the same region. Imaging features are compatible with cellulitis. Soft tissue gas is compatible with necrotizing infection of the subcutaneous tissues. No focal or rim enhancing fluid collection to suggest subcutaneous abscess. 2. Layering sludge or noncalcified stones in the gallbladder. No gallbladder wall thickening or pericholecystic fluid evident. 3. 6 mm left lower lobe pulmonary nodule. Non-contrast chest CT at 6-12 months is recommended. If the nodule is stable at time of repeat CT, then future CT at 18-24 months (from today's scan) is considered optional for low-risk patients, but is recommended for high-risk patients. This recommendation follows the consensus statement: Guidelines for Management of Incidental Pulmonary Nodules Detected on CT Images: From the Fleischner Society 2017; Radiology 2017; 284:228-243. 4.  Aortic Atherosclerosis (ICD10-I70.0). Critical Value/emergent results were called by telephone at the time of interpretation on 05/10/2023 at 11:20 am to provider Dr. Albertina, who verbally acknowledged these results. Electronically Signed   By: Camellia Candle M.D.   On: 05/10/2023 11:22   DG Chest Port 1 View Result Date: 05/10/2023 CLINICAL DATA:  Sepsis EXAM: PORTABLE CHEST 1 VIEW COMPARISON:  X-ray 11/09/2021. FINDINGS: No consolidation, pneumothorax or effusion. No edema. Normal cardiopericardial silhouette. Overlapping cardiac leads. Film is rotated to the left. Fixation hardware along the lower cervical spine at the edge of the imaging field. IMPRESSION: No acute cardiopulmonary disease. Electronically Signed   By: Ranell Bring M.D.   On: 05/10/2023 10:32     Assessment & Plan:  Monica Ayers is a 63 y.o. female who presented to the hospital with a low grade fever.  CT of the abdomen and pelvis demonstrated findings concerning for a possible necrotizing soft tissue infection of the abdominal wall with edema and subcutaneous gas in the anterior left lower abdominal and pelvic wall with some overlying skin thickening.  WBC 12.4. Na 142, Glucose 211, LA 1.7  -Upon review of the imaging, the patient does have an extensive amount of subcutaneous emphysema in the left side of the abdomen with some associated edema.  The external exam is unimpressive for any kind of underlying acute process -Patient's gas and edema is likely related to receiving subcutaneous fluid resuscitation while at her facility in this region, however will perform an incision and drainage at bedside to verify there is no underlying subcutaneous necrosis/infection -The risk and benefits of incision and drainage of the abdominal wall were discussed including but not limited to bleeding, infection, and need for additional procedures.  After careful consideration, Berdia Lachman Bechtel's guardian has decided to proceed with the procedure. -Please refer to separate procedure note for details related to procedure -Upon incision and drainage, there was healthy subcutaneous fat and blood without any significant drainage.  There was some tracking under the skin, likely along the planes that the gas have been present.  I do not believe that this patient has necrotizing soft tissue infection given physical exam findings, findings at incision and drainage, and based on blood work -Antibiotics per hospitalist/primary team as needed for possible UTI.  No need for antibiotics related to abdominal wall -Local wound care.  Will plan to remove gauze packing tomorrow -Continue to monitor abdominal wall for significant changes, including erythema, induration, tenderness, or foul/purulent drainage from I&D site -Care per primary team  All  questions were answered to the satisfaction of the patient's guardian.  -- Dorothyann Brittle, DO [facility] [address] [place] [address], [place] [address] [phone] (office)

## 2023-05-10 NOTE — ED Provider Notes (Signed)
 [place] EMERGENCY DEPARTMENT AT [facility] Provider Note   CSN: [id number] Arrival date & time: 05/10/23  0930     History  Chief Complaint  Patient presents with   Fever    Monica Ayers is a 63 y.o. female.  HPI   63 year old female presents emergency department from [facility] assisted living facility with complaints of fever, decreased p.o. intake.  Patient nonverbal at baseline to history given by nursing staff at living facility.  Per nursing staff, patient has been dealing with intermittent illness since around 12/27.  Patient reportedly does not eat very well when she becomes ill given that she is unable to verbally communicate her symptoms.  At that time, had positive UA was given 2 g of Rocephin  and was given 7 days of Bactrim of which she completed yesterday.  Her urine culture obtained on 12/27 grew out Klebsiella which was susceptible to oral Bactrim.  Other labs obtained were reportedly reassuring.  Per nursing staff, patient was doing much better after antibiotics were begun up until yesterday when she finished her Bactrim.  Was eating as more at baseline.  Yesterday, developed low-grade fever and began to have more difficulty with getting the patient to eat.  Symptoms persisted today prompting visit to the emergency department.  Nursing staff denied any complaints of pain, cough, vomiting.  Per nursing staff, patient minimally verbally interactive.  States that she will sometimes laugh and giggle and respond but is very sparse in her verbal responses.  Was last given Tylenol  around 8 AM this morning  Past medical history significant for diabetes mellitus type 2, hypothyroidism, hypertension, sepsis, frontal lobe dementia  Home Medications Prior to Admission medications   Medication Sig Start Date End Date Taking? Authorizing Provider  acetaminophen  (TYLENOL ) 325 MG tablet Take 2 tablets (650 mg total) by mouth every 6 (six) hours as needed for mild pain,  fever or headache (or Fever >/= 101). 12/30/18  Yes Pearlean Manus, MD  atorvastatin  (LIPITOR) 40 MG tablet Take 1 tablet (40 mg total) by mouth daily at 6 PM. 11/11/21 05/10/23 Yes Sebastian Toribio GAILS, MD  cholecalciferol  (VITAMIN D3) 25 MCG (1000 UNIT) tablet Take 2,000 Units by mouth daily.   Yes [provider]  lisinopril  (ZESTRIL ) 10 MG tablet Take 10 mg by mouth daily.   Yes [provider]  LORazepam  (ATIVAN ) 2 MG tablet Take 2 mg by mouth See admin instructions. Give one-hour prior to dental procedure for 1 day.   Yes [provider]  melatonin 3 MG TABS tablet Take 9 mg by mouth at bedtime.   Yes [provider]  metFORMIN  (GLUCOPHAGE ) 500 MG tablet Take 1 tablet (500 mg total) by mouth daily. Patient taking differently: Take 1,000 mg by mouth 2 (two) times daily with a meal. 01/10/19 05/10/23 Yes Moishe Chiquita CHRISTELLA, NP  methocarbamol (ROBAXIN) 750 MG tablet Take 750 mg by mouth every evening. 02/24/22  Yes [provider]  potassium chloride  (KLOR-CON ) 10 MEQ tablet Take 1 tablet (10 mEq total) by mouth daily. Patient taking differently: Take 10 mEq by mouth every morning. 04/24/19  Yes Moishe Chiquita CHRISTELLA, NP  saccharomyces boulardii (FLORASTOR) 250 MG capsule Take 250 mg by mouth 2 (two) times daily.   Yes [provider]  senna (SENOKOT) 8.6 MG TABS tablet Take 1 tablet (8.6 mg total) by mouth 2 (two) times daily. 11/11/21  Yes Sebastian Toribio GAILS, MD  metoprolol  tartrate (LOPRESSOR ) 25 MG tablet Take 25 mg by  mouth See admin instructions. One time only for increased HR/BP for 1 day    [provider]      Allergies    Latex    Review of Systems   Review of Systems  Physical Exam Updated Vital Signs BP 136/89   Pulse 97   Temp 100.3 F (37.9 C) (Rectal)   Resp 19   Ht 5' 7 (1.702 m)   Wt 64.5 kg   SpO2 95%   BMI 22.27 kg/m  Physical Exam Vitals and nursing note reviewed.  Constitutional:      General: She is not in  acute distress.    Appearance: She is well-developed.  HENT:     Head: Normocephalic and atraumatic.  Eyes:     Conjunctiva/sclera: Conjunctivae normal.  Cardiovascular:     Rate and Rhythm: Normal rate and regular rhythm.  Pulmonary:     Effort: Pulmonary effort is normal. No respiratory distress.     Breath sounds: Normal breath sounds.  Abdominal:     Palpations: Abdomen is soft.     Tenderness: There is abdominal tenderness. There is no guarding.     Comments: Left lower abdominal tenderness.  1 to 1.5 cm diameter scab appreciated left lower abdomen.  Abdominal wall without overlying erythema, palpable fluctuance/induration.  Musculoskeletal:        General: No swelling.     Cervical back: Neck supple.     Right lower leg: No edema.     Left lower leg: No edema.  Skin:    General: Skin is warm and dry.     Capillary Refill: Capillary refill takes less than 2 seconds.     Comments: Stage 1-2 sacral ulcer with bandage marked that it was changed yesterday.  No obvious extending erythema or palpable fluctuance.  Psychiatric:     Comments: Patient not responsive verbally.     ED Results / Procedures / Treatments   Labs (all labs ordered are listed, but only abnormal results are displayed) Labs Reviewed  COMPREHENSIVE METABOLIC PANEL - Abnormal; Notable for the following components:      Result Value   Glucose, Bld 211 (*)    BUN 28 (*)    Albumin 3.4 (*)    All other components within normal limits  CBC WITH DIFFERENTIAL/PLATELET - Abnormal; Notable for the following components:   WBC 12.4 (*)    RBC 5.39 (*)    Hemoglobin 15.5 (*)    HCT 48.3 (*)    Neutro Abs 9.1 (*)    All other components within normal limits  URINALYSIS, W/ REFLEX TO CULTURE (INFECTION SUSPECTED) - Abnormal; Notable for the following components:   Specific Gravity, Urine >1.046 (*)    Glucose, UA 50 (*)    Protein, ur 100 (*)    All other components within normal limits  RESP PANEL BY RT-PCR  (RSV, FLU A&B, COVID)  RVPGX2  CULTURE, BLOOD (ROUTINE X 2)  CULTURE, BLOOD (ROUTINE X 2)  LACTIC ACID, PLASMA  PROTIME-INR  APTT  HIV ANTIBODY (ROUTINE TESTING W REFLEX)  HEMOGLOBIN A1C  CBG MONITORING, ED    EKG None  Radiology CT ABDOMEN PELVIS W CONTRAST Result Date: 05/10/2023 CLINICAL DATA:  Fever with abdominal pain. EXAM: CT ABDOMEN AND PELVIS WITH CONTRAST TECHNIQUE: Multidetector CT imaging of the abdomen and pelvis was performed using the standard protocol following bolus administration of intravenous contrast. RADIATION DOSE REDUCTION: This exam was performed according to the departmental dose-optimization program which includes automated exposure control,  adjustment of the mA and/or kV according to patient size and/or use of iterative reconstruction technique. CONTRAST:  OMNIPAQUE  IOHEXOL  300 MG/ML  SOLN COMPARISON:  None Available. FINDINGS: Lower chest: Dependent atelectasis noted left lower lobe. 6 mm left lower lobe pulmonary nodule identified on image 5/series 3. Hepatobiliary: No suspicious focal abnormality within the liver parenchyma. Layering sludge or noncalcified stones identified in the gallbladder. No gallbladder wall thickening or pericholecystic fluid evident. No intrahepatic or extrahepatic biliary dilation. Pancreas: No focal mass lesion. No dilatation of the main duct. No intraparenchymal cyst. No peripancreatic edema. Spleen: No splenomegaly. No suspicious focal mass lesion. Adrenals/Urinary Tract: No adrenal nodule or mass. 11 mm hypodensity upper pole right kidney cannot be definitively characterized but is statistically most likely a cyst. No followup imaging is recommended. Left kidney unremarkable. No evidence for hydroureter. The urinary bladder appears normal for the degree of distention. Stomach/Bowel: Stomach is unremarkable. No gastric wall thickening. No evidence of outlet obstruction. Duodenum is normally positioned as is the ligament of Treitz. No  small bowel wall thickening. No small bowel dilatation. The terminal ileum is normal. The appendix is normal. No gross colonic mass. Focal wall thickening with luminal narrowing in the proximal rectum (axial 71/2) appears smooth in symmetric, most likely peristalsis. Given the large volume of stool distal to this location it does not appear obstructive. Vascular/Lymphatic: There is mild atherosclerotic calcification of the abdominal aorta without aneurysm. There is no gastrohepatic or hepatoduodenal ligament lymphadenopathy. No retroperitoneal or mesenteric lymphadenopathy. No pelvic sidewall lymphadenopathy. Reproductive: The uterus is unremarkable.  There is no adnexal mass. Other: No intraperitoneal free fluid. Musculoskeletal: Extensive edema in subcutaneous gas is identified in the anterior left lower abdominal and pelvic wall. There is some overlying skin thickening in the same region. No focal or rim enhancing fluid collection to suggest subcutaneous abscess. IMPRESSION: 1. Extensive edema and subcutaneous gas in the anterior left lower abdominal and pelvic wall with some overlying skin thickening in the same region. Imaging features are compatible with cellulitis. Soft tissue gas is compatible with necrotizing infection of the subcutaneous tissues. No focal or rim enhancing fluid collection to suggest subcutaneous abscess. 2. Layering sludge or noncalcified stones in the gallbladder. No gallbladder wall thickening or pericholecystic fluid evident. 3. 6 mm left lower lobe pulmonary nodule. Non-contrast chest CT at 6-12 months is recommended. If the nodule is stable at time of repeat CT, then future CT at 18-24 months (from today's scan) is considered optional for low-risk patients, but is recommended for high-risk patients. This recommendation follows the consensus statement: Guidelines for Management of Incidental Pulmonary Nodules Detected on CT Images: From the Fleischner Society 2017; Radiology 2017;  284:228-243. 4.  Aortic Atherosclerosis (ICD10-I70.0). Critical Value/emergent results were called by telephone at the time of interpretation on 05/10/2023 at 11:20 am to provider Dr. Albertina, who verbally acknowledged these results. Electronically Signed   By: Camellia Candle M.D.   On: 05/10/2023 11:22   DG Chest Port 1 View Result Date: 05/10/2023 CLINICAL DATA:  Sepsis EXAM: PORTABLE CHEST 1 VIEW COMPARISON:  X-ray 11/09/2021. FINDINGS: No consolidation, pneumothorax or effusion. No edema. Normal cardiopericardial silhouette. Overlapping cardiac leads. Film is rotated to the left. Fixation hardware along the lower cervical spine at the edge of the imaging field. IMPRESSION: No acute cardiopulmonary disease. Electronically Signed   By: Ranell Bring M.D.   On: 05/10/2023 10:32    Procedures Procedures    Medications Ordered in ED Medications  lactated ringers  infusion (  Intravenous Rate/Dose Change 05/10/23 1457)  vancomycin  (VANCOREADY) IVPB 1250 mg/250 mL (0 mg Intravenous Stopped 05/10/23 1434)    Followed by  vancomycin  (VANCOREADY) IVPB 750 mg/150 mL (has no administration in time range)  enoxaparin  (LOVENOX ) injection 40 mg (has no administration in time range)  acetaminophen  (TYLENOL ) tablet 650 mg (has no administration in time range)    Or  acetaminophen  (TYLENOL ) suppository 650 mg (has no administration in time range)  ondansetron  (ZOFRAN ) tablet 4 mg (has no administration in time range)    Or  ondansetron  (ZOFRAN ) injection 4 mg (has no administration in time range)  insulin  aspart (novoLOG ) injection 0-9 Units (has no administration in time range)  insulin  aspart (novoLOG ) injection 0-5 Units (has no administration in time range)  atorvastatin  (LIPITOR) tablet 40 mg (has no administration in time range)  lisinopril  (ZESTRIL ) tablet 10 mg (has no administration in time range)  melatonin tablet 9 mg (has no administration in time range)  saccharomyces boulardii (FLORASTOR) capsule 250  mg (has no administration in time range)  ceFEPIme  (MAXIPIME ) 2 g in sodium chloride  0.9 % 100 mL IVPB (has no administration in time range)  cefTRIAXone  (ROCEPHIN ) 2 g in sodium chloride  0.9 % 100 mL IVPB (0 g Intravenous Stopped 05/10/23 1049)  iohexol  (OMNIPAQUE ) 300 MG/ML solution 100 mL (100 mLs Intravenous Contrast Given 05/10/23 1107)  ceFEPIme  (MAXIPIME ) 2 g in sodium chloride  0.9 % 100 mL IVPB (0 g Intravenous Stopped 05/10/23 1210)  clindamycin  (CLEOCIN ) IVPB 600 mg (0 mg Intravenous Stopped 05/10/23 1256)  lidocaine  (PF) (XYLOCAINE ) 1 % injection 10 mL (10 mLs Other Given 05/10/23 1227)    ED Course/ Medical Decision Making/ A&P Clinical Course as of 05/10/23 1542  Mon May 10, 2023  1127 Consulted nurse at [facility] who stated they are getting patient subcu IV fluids in her left lower abdomen just prior to patient coming to the ER.  States the IV was removed just before EMS arrived. [CR]  1145 CT ABDOMEN PELVIS W CONTRAST Consulted General surgery Dr. Evonnie regarding the patient who will evaluate the patient in person for assessment regarding CT findings.  Will perform bedside local I&D for assessment of reading of necrotizing infection. [CR]    Clinical Course User Index [CR] Silver Wonda LABOR, PA                                  Medical Decision Making Amount and/or Complexity of Data Reviewed Labs: ordered. Radiology: ordered. Decision-making details documented in ED Course.  Risk Prescription drug management. Decision regarding hospitalization.   This patient presents to the ED for concern of fever, this involves an extensive number of treatment options, and is a complaint that carries with it a high risk of complications and morbidity.  The differential diagnosis includes UTI, pneumonia, bacteremia, intra-abdominal infection, other   Co morbidities that complicate the patient evaluation  See HPI   Additional history obtained:  Additional history obtained from  EMR External records from outside source obtained and reviewed including hospital record notes   Lab Tests:  I Ordered, and personally interpreted labs.  The pertinent results include: Leukocytosis of 12.4 with left shift.  Polycythemia hemoglobin 15.5.  Lites within normal range.  No electrolyte abnormalities.  No transaminitis.  No renal dysfunction.  Isolated elevated BUN 28.  Viral panel negative.  UA with 100 protein, 50 glucose but otherwise unremarkable.   Imaging Studies ordered:  I ordered imaging studies including chest x-ray, CT abdomen pelvis I independently visualized and interpreted imaging which showed  Chest x-ray: Acute cardiopulmonary abnormality. CT abdomen pelvis: Edema and subcu gas anterior left lower abdomen some overlying skin thickening.  Layering sludge and noncalcified stones in gallbladder.  6 mm left lower lobe pulmonary nodule.  Aortic atherosclerosis. I agree with the radiologist interpretation  Cardiac Monitoring: / EKG:  The patient was maintained on a cardiac monitor.  I personally viewed and interpreted the cardiac monitored which showed an underlying rhythm of: Sinus tachycardia   Consultations Obtained:  See ED course  Problem List / ED Course / Critical interventions / Medication management  SIRS, subcutaneous emphysema I ordered medication including Rocephin , clindamycin , vancomycin , cefepime    Reevaluation of the patient after these medicines showed that the patient improved I have reviewed the patients home medicines and have made adjustments as needed   Social Determinants of Health:  Baseline dementia.  Assisted living facility.  Former cigarette use.   Test / Admission - Considered:  SIRS, subcutaneous emphysema Vitals signs significant for fever with normal temp of 100.3, tachycardia with a heart rate of 104. Otherwise within normal range and stable throughout visit. Laboratory/imaging studies significant for: See  above 63 year old female presents emergency department from [facility] assisted living facility with complaints of fever, decreased p.o. intake over the past 24 to 48 hours.  Patient with baseline dementia unable to give any history herself.  Has been receiving subcutaneous IV fluids with most recent treatment earlier today.  Initially concerned about UTI refractory to oral antibiotics given the outpatient setting.  On exam, patient with reproducible tenderness left lower abdomen.  Sacral ulcer present but without significantly extending erythema, palpable fluctuance/induration.  Per initial vital signs, concern for sepsis given tachycardia as well as fever and presumed infectious source.  Labs and broad-spectrum antibiotics begun.  Labs concerning for leukocytosis of 12.4.  Viral testing negative.  Chest x-ray without obvious pneumonia or other abnormality.  CT scan of abdomen concerning for subcutaneous air left lower quadrant where patient was receiving subcutaneous fluids.  Low clinical suspicion for necrotizing infection.  Patient assessed at bedside with local I&D performed by general surgery who agrees low likelihood for necrotizing infection and subcutaneous air most likely secondary to IV that was placed in area.  Unsure of exact source of infection but given patient's meeting of SIRS criteria, demented state unable to give any history, evidence of subcutaneous air on CT on abdomen despite most likely source being iatrogenic, admission deemed most appropriate for least observation. Treatment plan were discussed at length with patient's caregiver and they knowledge understanding was agreeable to said plan.  Appropriate consultations were made as described in the ED course.  Patient was stable upon admission to the hospital.         Final Clinical Impression(s) / ED Diagnoses Final diagnoses:  SIRS (systemic inflammatory response syndrome) (HCC)  Subcutaneous emphysema, initial encounter [facility])     Rx / DC Orders ED Discharge Orders     None         Silver Wonda LABOR, [place] 05/10/23 1542    Albertina Dixon, MD 05/10/23 2150

## 2023-05-10 NOTE — Procedures (Signed)
 Bedside procedure note   Preoperative Diagnosis: Abdominal wall subcutaneous emphysema, concern for necrotizing soft tissue infection Postoperative Diagnosis: Abdominal wall subcutaneous emphysema   Procedure(s) Performed: Incision and drainage of abdominal wall   Performing provider: Dorothyann Ardean Simonich, DO   Estimated Blood Loss: Minimal   Findings: Healthy underlying fat without drainage, mild tracking along abdominal wall, likely along planes of previous gas   Procedure: At bedside, left side of the abdomen was prepped with ChloraPrep and draped with OR towels.  Verbal consent obtained from the patient's guardian prior to beginning of procedure.  Timeout was performed.  Area in the left side the abdomen overlying subcutaneous emphysema was localized with lidocaine .  Using scalpel, single straight incision was created.  There was underlying healthy fat which was bleeding.  There was no evidence of any purulent drainage, dirty dishwater drainage, or significant inflammation.  The area was probed, there was some tracking underlying the abdominal wall towards the umbilicus and to the left side of the abdomen, again without any inflammatory changes or significant drainage noted.  The wound was then packed with saline dampened gauze, covered with 4 x 4 and Medipore tape.  Patient tolerated the procedure without issue.  Dorothyann Brittle, DO [facility] [address] [place] [address], [place] [address] [phone] (office)

## 2023-05-10 NOTE — Progress Notes (Signed)
 Pharmacy Antibiotic Note  Monica Ayers is a 63 y.o. female admitted on 05/10/2023 with  wound infection .  Pharmacy has been consulted for Vancomycin  and cefepimedosing. CT shows cellulitis in abdominal with gas and possible early necrotizing fascitis.   Plan: Vancomycin  1250mg  IV loading dose then 750 mg IV Q 12 hrs. Goal AUC 400-550. Expected AUC: 511 SCr used: 0.8(actual 0.62)  Cefepime  2gm IV q8h F/u cxs and clinical progress Monitor V/S, labs and levels as indicated  Height: 5' 7 (170.2 cm) Weight: 64.5 kg (142 lb 3.2 oz) IBW/kg (Calculated) : 61.6  Temp (24hrs), Avg:100.3 F (37.9 C), Min:100.3 F (37.9 C), Max:100.3 F (37.9 C)  Recent Labs  Lab 05/10/23 1002  WBC 12.4*  CREATININE 0.62  LATICACIDVEN 1.7    Estimated Creatinine Clearance: 70.9 mL/min (by C-G formula based on SCr of 0.62 mg/dL).    Allergies  Allergen Reactions   Latex Rash    Antimicrobials this admission: Cefepime  1/6>> Clindamycin  and ceftriaxone  1/6 x 1 dose in ED Vancomycin  1/6 >>    Microbiology results: 1/6 BCx: pending  MRSA PCR:   Thank you for allowing pharmacy to be a part of this patient's care.  Davier Tramell, BS Pharm D, BCPS Clinical Pharmacist 05/10/2023 1:44 PM

## 2023-05-10 NOTE — H&P (Signed)
 History and Physical    Patient: Monica Ayers FMW:[id number] DOB: 11/14/1960 DOA: 05/10/2023 DOS: the patient was seen and examined on 05/10/2023 PCP: Monica Ayers  Patient coming from: SNF [facility]).  Chief Complaint:  Chief Complaint  Patient presents with   Fever   HPI: Monica Ayers is a 63 y.o. female with medical history significant of dementia, hypertension, type 2 diabetes and recent UTI treated with 2 g of Rocephin  and 7 days of bactrim.  Apparently patient was not eating or drinking much at the facility and ended receiving subcutaneous fluid resuscitation.  Patient having transfer secondary to fever and concerns for abdominal cellulitis.  Patient met criteria for sepsis with fever, elevated WBCs and identified source of infection.  Cultures taken, IV antibiotics provided along with fluid resuscitation.  General surgery consulted who have provided I&D at bedside.  There was initial concern for necrotizing fasciitis but this was ruled out after surgical evaluation.  Review of Systems: unable to review all systems due to the inability of the patient to answer questions. Past Medical History:  Diagnosis Date   Allergic state 10/02/2016   Allergy    Dementia (HCC)    Diabetes mellitus without complication (HCC)    Hypertension    Hyperthyroidism    Lactic acidosis 11/17/2018   Pressure injury of skin 11/18/2018   Sepsis (HCC) 12/28/2018   Sepsis due to undetermined organism (HCC) 11/17/2018   Past Surgical History:  Procedure Laterality Date   FRACTURE SURGERY     HIP ARTHROPLASTY Right 11/05/2021   Procedure: RIGHT HIP HEMI ARTHROPLASTY;  Surgeon: Edna Toribio LABOR, MD;  Location: [facility] OR;  Service: Orthopedics;  Laterality: Right;   KNEE SURGERY Right    NECK SURGERY     SHOULDER SURGERY Left    Social History:  reports that she has quit smoking. Her smoking use included cigarettes. She has never used smokeless tobacco. She reports that she does not drink alcohol  and does not use drugs.  Allergies  Allergen Reactions   Latex Rash    History reviewed. No pertinent family history.  Prior to Admission medications   Medication Sig Start Date End Date Taking? Authorizing Provider  acetaminophen  (TYLENOL ) 325 MG tablet Take 2 tablets (650 mg total) by mouth every 6 (six) hours as needed for mild pain, fever or headache (or Fever >/= 101). 12/30/18  Yes Pearlean Manus, MD  atorvastatin  (LIPITOR) 40 MG tablet Take 1 tablet (40 mg total) by mouth daily at 6 PM. 11/11/21 05/10/23 Yes Sebastian Toribio GAILS, MD  cholecalciferol  (VITAMIN D3) 25 MCG (1000 UNIT) tablet Take 2,000 Units by mouth daily.   Yes [provider]  lisinopril  (ZESTRIL ) 10 MG tablet Take 10 mg by mouth daily.   Yes [provider]  LORazepam  (ATIVAN ) 2 MG tablet Take 2 mg by mouth See admin instructions. Give one-hour prior to dental procedure for 1 day.   Yes [provider]  melatonin 3 MG TABS tablet Take 9 mg by mouth at bedtime.   Yes [provider]  metFORMIN  (GLUCOPHAGE ) 500 MG tablet Take 1 tablet (500 mg total) by mouth daily. Patient taking differently: Take 1,000 mg by mouth 2 (two) times daily with a meal. 01/10/19 05/10/23 Yes Moishe Chiquita CHRISTELLA, NP  methocarbamol (ROBAXIN) 750 MG tablet Take 750 mg by mouth every evening. 02/24/22  Yes [provider]  potassium chloride  (KLOR-CON ) 10 MEQ tablet Take 1 tablet (10 mEq total) by mouth daily. Patient taking differently: Take  10 mEq by mouth every morning. 04/24/19  Yes Moishe Chiquita HERO, NP  saccharomyces boulardii (FLORASTOR) 250 MG capsule Take 250 mg by mouth 2 (two) times daily.   Yes [provider]  senna (SENOKOT) 8.6 MG TABS tablet Take 1 tablet (8.6 mg total) by mouth 2 (two) times daily. 11/11/21  Yes Sebastian Toribio GAILS, MD  metoprolol  tartrate (LOPRESSOR ) 25 MG tablet Take 25 mg by mouth See admin instructions. One time only for increased HR/BP for 1 day    [provider]    Physical Exam: Vitals:   05/10/23 1000 05/10/23 1030 05/10/23 1615 05/10/23 1715  BP: 129/88 136/89 129/81 137/86  Pulse: (!) 104 97 87 92  Resp: (!) 23 19 17 20   Temp: 100.3 F (37.9 C)     TempSrc: Rectal     SpO2: 92% 95% 94% 95%  Weight:      Height:       General exam: To follow simple commands intermittently; patient with underlying dementia. Respiratory system: Clear to auscultation. Respiratory effort normal.  Good saturation on room air. Cardiovascular system: Sinus tachycardia, no rubs or gallops. Gastrointestinal system: Abdomen is nondistended, soft and with positive bowel sounds.  No guarding.  Left-sided area with packed wound and clean dressings. Central nervous system: Moving 4 limbs spontaneously.  No focal neurological deficits. Extremities: No cyanosis or clubbing. Skin: No petechiae; clean dressings over packed wound in her abdomen appreciated. Psychiatry: Judgement and insight appear impaired secondary to dementia.  Data Reviewed: Respiratory panel: Negative for COVID influenza A, influenza B and RSV Blood cultures: Pending Urinalysis: Demonstrating negative nitrite, negative leukocyte esterase and clear yellow appearance. -Lactic acid: 1.7 Comprehensive panel: Sodium 142, potassium 4.2, chloride 105, bicarb 26, glucose 211, creatinine 0.62, normal LFTs and a GFR >60 CBC: WBC 2.4, hemoglobin 15.5 from platelet counts 342K  Assessment and Plan: 1-sepsis secondary to abdominal cellulitis -Status post I&D at bedside by general surgery -Culture has been taken -Fluid resuscitation provided -IV antibiotics has been started and will follow clinical response -Continue as needed antipyretics and supportive care.  2-type 2 diabetes -Modified carbohydrate diet has been ordered -Follow CBGs fluctuation and adjust medication as needed -Oral meds has been held while inpatient; sliding scale insulin  has been ordered.  3-hypertension -Resume home  antihypertensive agents and follow vital signs.  4-hyperlipidemia -Continue statin  5-mild dehydration -Appreciated with elevated specific gravity on patient's urinalysis -Fluid resuscitation will be provided as per sepsis protocol -Encouraged to maintain adequate oral intake -Continue supportive care and follow clinical response.    Advance Care Planning:   Code Status: Full Code   Consults: General Surgery  Family Communication: No family at bedside.  Severity of Illness: The appropriate patient status for this patient is INPATIENT. Inpatient status is judged to be reasonable and necessary in order to provide the required intensity of service to ensure the patient's safety. The patient's presenting symptoms, physical exam findings, and initial radiographic and laboratory data in the context of their chronic comorbidities is felt to place them at high risk for further clinical deterioration. Furthermore, it is not anticipated that the patient will be medically stable for discharge from the hospital within 2 midnights of admission.   * I certify that at the point of admission it is my clinical judgment that the patient will require inpatient hospital care spanning beyond 2 midnights from the point of admission due to high intensity of service, high risk for further deterioration and high frequency of surveillance  required.*  Author: Eric Nunnery, MD 05/10/2023 6:45 PM  For on call review [email or web address].

## 2023-05-10 NOTE — ED Triage Notes (Signed)
 Pt bib [facility] from [facility] nursing home. Nursing staff/EMS states pt has had a fever since last week d/t a UTI. Pt was given a shot of rocephin  last week and a wks worth of Bactrim ABT. Pt still had a low grade fever this morning. Facility gave Tylenol  around [phone].

## 2023-05-11 ENCOUNTER — Other Ambulatory Visit: Payer: Self-pay

## 2023-05-11 DIAGNOSIS — A419 Sepsis, unspecified organism: Secondary | ICD-10-CM | POA: Diagnosis not present

## 2023-05-11 DIAGNOSIS — T797XXD Traumatic subcutaneous emphysema, subsequent encounter: Secondary | ICD-10-CM

## 2023-05-11 DIAGNOSIS — L0390 Cellulitis, unspecified: Secondary | ICD-10-CM | POA: Diagnosis not present

## 2023-05-11 LAB — BASIC METABOLIC PANEL
Anion gap: 8 (ref 5–15)
BUN: 21 mg/dL (ref 8–23)
CO2: 24 mmol/L (ref 22–32)
Calcium: 8.7 mg/dL — ABNORMAL LOW (ref 8.9–10.3)
Chloride: 109 mmol/L (ref 98–111)
Creatinine, Ser: <0.3 mg/dL — ABNORMAL LOW (ref 0.44–1.00)
Glucose, Bld: 164 mg/dL — ABNORMAL HIGH (ref 70–99)
Potassium: 3.3 mmol/L — ABNORMAL LOW (ref 3.5–5.1)
Sodium: 141 mmol/L (ref 135–145)

## 2023-05-11 LAB — BLOOD CULTURE ID PANEL (REFLEXED) - BCID2

## 2023-05-11 LAB — CBC
HCT: 43 % (ref 36.0–46.0)
Hemoglobin: 13.6 g/dL (ref 12.0–15.0)
MCH: 28.8 pg (ref 26.0–34.0)
MCHC: 31.6 g/dL (ref 30.0–36.0)
MCV: 91.1 fL (ref 80.0–100.0)
Platelets: 276 10*3/uL (ref 150–400)
RBC: 4.72 MIL/uL (ref 3.87–5.11)
RDW: 14 % (ref 11.5–15.5)
WBC: 10.4 10*3/uL (ref 4.0–10.5)
nRBC: 0 % (ref 0.0–0.2)

## 2023-05-11 LAB — HEMOGLOBIN A1C
Hgb A1c MFr Bld: 6.9 % — ABNORMAL HIGH (ref 4.8–5.6)
Mean Plasma Glucose: 151 mg/dL

## 2023-05-11 LAB — GLUCOSE, CAPILLARY
Glucose-Capillary: 235 mg/dL — ABNORMAL HIGH (ref 70–99)
Glucose-Capillary: 138 mg/dL — ABNORMAL HIGH (ref 70–99)
Glucose-Capillary: 130 mg/dL — ABNORMAL HIGH (ref 70–99)
Glucose-Capillary: 135 mg/dL — ABNORMAL HIGH (ref 70–99)

## 2023-05-11 LAB — HIV ANTIBODY (ROUTINE TESTING W REFLEX): HIV Screen 4th Generation wRfx: NONREACTIVE

## 2023-05-11 MED ORDER — CEFAZOLIN SODIUM-DEXTROSE 1-4 GM/50ML-% IV SOLN
1.0000 g | Freq: Three times a day (TID) | INTRAVENOUS | Status: DC
  Administered 2023-05-11 – 2023-05-12 (×3): 1 g via INTRAVENOUS
  Filled 2023-05-11 (×6): qty 50

## 2023-05-11 MED ORDER — POTASSIUM CHLORIDE CRYS ER 20 MEQ PO TBCR
40.0000 meq | EXTENDED_RELEASE_TABLET | Freq: Once | ORAL | Status: AC
Start: 2023-05-11 — End: 2023-05-11
  Administered 2023-05-11: 40 meq via ORAL
  Filled 2023-05-11: qty 2

## 2023-05-11 NOTE — Plan of Care (Signed)
  Problem: Fluid Volume: Goal: Ability to maintain a balanced intake and output will improve Outcome: Progressing   Problem: Nutrition: Goal: Adequate nutrition will be maintained Outcome: Progressing   Problem: Fluid Volume: Goal: Ability to maintain a balanced intake and output will improve Outcome: Progressing   Problem: Nutrition: Goal: Adequate nutrition will be maintained Outcome: Progressing

## 2023-05-11 NOTE — TOC Initial Note (Signed)
 Transition of Care [facility]) - Initial/Assessment Note    Patient Details  Name: Monica Ayers MRN: [id number] Date of Birth: [birth year]  [facility]) CM/SW Contact:    Rollo Petri, LCSW Phone Number: 05/11/2023, 10:36 AM  Clinical Narrative:                  Pt admitted from long term care at [facility]. Pt has a legal guardian, Chance from DSS. MD anticipating dc tomorrow.  Updated Chance today and he states that he is agreeable for pt to return to [facility] at DC.  Updated Logan at [facility] She states pt can return at dc.  TOC will follow.  Expected Discharge Plan: Long Term Nursing Home Barriers to Discharge: Continued Medical Work up   Patient Goals and CMS Choice Patient states their goals for this hospitalization and ongoing recovery are:: return to [facility] [email or web address].gov Compare Post Acute Care list provided to:: Legal Guardian Choice offered to / list presented to : [facility] POA / Guardian      Expected Discharge Plan and Services In-house Referral: Clinical Social Work   Post Acute Care Choice: Resumption of Svcs/PTA Provider Living arrangements for the past 2 months: Skilled Nursing Facility                                      Prior Living Arrangements/Services Living arrangements for the past 2 months: Skilled Nursing Facility Lives with:: Facility Resident Patient language and need for interpreter reviewed:: Yes Do you feel safe going back to the place where you live?: Yes      Need for Family Participation in Patient Care: No (Comment) Care giver support system in place?: Yes (comment)   Criminal Activity/Legal Involvement Pertinent to Current Situation/Hospitalization: No - Comment as needed  Activities of Daily Living   ADL Screening (condition at time of admission) Independently performs ADLs?: No Does the patient have a NEW difficulty with bathing/dressing/toileting/self-feeding that is expected to last >3  days?: No Does the patient have a NEW difficulty with getting in/out of bed, walking, or climbing stairs that is expected to last >3 days?: No Does the patient have a NEW difficulty with communication that is expected to last >3 days?: No Is the patient deaf or have difficulty hearing?: No Does the patient have difficulty seeing, even when wearing glasses/contacts?: No Does the patient have difficulty concentrating, remembering, or making decisions?: Yes  Permission Sought/Granted Permission sought to share information with : Facility Industrial/product Designer granted to share information with : Yes, Verbal Permission Granted     Permission granted to share info w AGENCY: [place]        Emotional Assessment Appearance:: Appears stated age Attitude/Demeanor/Rapport: Unable to Assess Affect (typically observed): Unable to Assess Orientation: : Oriented to Self Alcohol / Substance Use: Not Applicable Psych Involvement: No (comment)  Admission diagnosis:  SIRS (systemic inflammatory response syndrome) (HCC) [R65.10] Subcutaneous emphysema, initial encounter (HCC) [T79.7XXA] Sepsis due to cellulitis (HCC) [L03.90, A41.9] Patient Active Problem List   Diagnosis Date Noted   Subcutaneous emphysema (HCC) 05/10/2023   Sepsis due to cellulitis (HCC) 05/10/2023   Fever 11/11/2021   E. 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  [place] [address] Phone: [phone] Fax: [phone]  [facility] - [place], [place] - [address] [place] [address] Phone: [phone] Fax: [phone]  [facility] - [place], [place] - [address] [place] [address] Phone: [phone] Fax: [phone]     Social Drivers of Health (SDOH) Social History: SDOH Screenings   Food Insecurity: Patient Unable To Answer (05/10/2023)  Housing: Patient Unable To Answer (05/10/2023)  Transportation Needs: Patient Unable To Answer (05/10/2023)  Utilities: Patient Unable To Answer (05/10/2023)  Depression (PHQ2-9): Low Risk  (06/23/2019)  Tobacco Use: Medium Risk (05/10/2023)   SDOH Interventions:     Readmission Risk Interventions     No data to display

## 2023-05-11 NOTE — NC FL2 (Signed)
 [place]  MEDICAID FL2 LEVEL OF CARE FORM     IDENTIFICATION  Patient Name: Monica Ayers Birthdate: 1960-11-11 Sex: female Admission Date (Current Location): 05/10/2023  [facility] and [place] Number:  [employer] and Address:  [facility],  [address], [place] [address]      Provider Number: [id number]  Attending Physician Name and Address:  Ricky Fines, MD  Relative Name and Phone Number:       Current Level of Care: Hospital Recommended Level of Care: Skilled Nursing Facility Prior Approval Number:    Date Approved/Denied:   PASRR Number:    Discharge Plan: SNF    Current Diagnoses: Patient Active Problem List   Diagnosis Date Noted   Subcutaneous emphysema (HCC) 05/10/2023   Sepsis due to cellulitis (HCC) 05/10/2023   Fever 11/11/2021   E. 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   Essential hypertension 10/02/2016    Orientation RESPIRATION BLADDER Height & Weight     Self  Normal Incontinent Weight: 131 lb 6.3 oz (59.6 kg) Height:  5' 7 (170.2 cm)  BEHAVIORAL SYMPTOMS/MOOD NEUROLOGICAL BOWEL NUTRITION STATUS      Incontinent Diet (see dc summary)  AMBULATORY STATUS COMMUNICATION OF NEEDS Skin   Total Care Does not communicate Surgical wounds, PU Stage and Appropriate Care ([place])   PU Stage 2 Dressing: Daily                   Personal Care Assistance Level of Assistance  Bathing, Feeding, Dressing Bathing Assistance: Maximum assistance Feeding assistance: Maximum assistance Dressing Assistance:  Maximum assistance     Functional Limitations Info  Sight, Hearing, Speech Sight Info: Adequate Hearing Info: Adequate Speech Info: Impaired    SPECIAL CARE FACTORS FREQUENCY                       Contractures Contractures Info: Not present    Additional Factors Info  Code Status, Allergies Code Status Info: Full Allergies Info: Latex           Current Medications (05/11/2023):  This is the current hospital active medication list Current Facility-Administered Medications  Medication Dose Route Frequency Provider Last Rate Last Admin   acetaminophen  (TYLENOL ) tablet 650 mg  650 mg Oral Q6H PRN Ricky Fines, MD       Or   acetaminophen  (TYLENOL ) suppository 650 mg  650 mg Rectal Q6H PRN Ricky Fines, MD       atorvastatin  (LIPITOR) tablet 40 mg  40 mg Oral q1800 Ricky Fines, MD   40 mg at 05/10/23 1726   ceFEPIme  (MAXIPIME ) 2 g in sodium chloride  0.9 % 100 mL IVPB  2 g Intravenous Q8H [place], Carlos, MD   Stopped at 05/11/23 0452   enoxaparin  (LOVENOX ) injection 40 mg  40 mg Subcutaneous Q24H Ricky Fines, MD   40 mg at 05/10/23 2100   insulin  aspart (novoLOG ) injection 0-5 Units  0-5 Units Subcutaneous QHS Ricky Fines, MD   2 Units at 05/10/23 2100   insulin  aspart (novoLOG ) injection 0-9 Units  0-9 Units Subcutaneous TID  WC Ricky Fines, MD   3 Units at 05/11/23 0915   lisinopril  (ZESTRIL ) tablet 10 mg  10 mg Oral Daily Ricky Fines, MD   10 mg at 05/11/23 0917   melatonin tablet 9 mg  9 mg Oral QHS Ricky Fines, MD   9 mg at 05/10/23 2100   ondansetron  (ZOFRAN ) tablet 4 mg  4 mg Oral Q6H PRN Ricky Fines, MD       Or   ondansetron  (ZOFRAN ) injection 4 mg  4 mg Intravenous Q6H PRN Ricky Fines, MD       saccharomyces boulardii (FLORASTOR) capsule 250 mg  250 mg Oral BID Ricky Fines, MD   250 mg at 05/11/23 0915   vancomycin  (VANCOREADY) IVPB 750 mg/150 mL  750 mg Intravenous Q12H Albertina Dixon, MD   Stopped at 05/11/23 0028     Discharge  Medications: Please see discharge summary for a list of discharge medications.  Relevant Imaging Results:  Relevant Lab Results:   Additional Information    Rollo Petri, LCSW

## 2023-05-11 NOTE — Progress Notes (Signed)
 Nurse at bedside,patient had a large bowel movement of brown mushy stool,patient cleaned and peri care provided. Plan of care on going.

## 2023-05-11 NOTE — Plan of Care (Signed)
  Problem: Fluid Volume: Goal: Ability to maintain a balanced intake and output will improve Outcome: Progressing   Problem: Metabolic: Goal: Ability to maintain appropriate glucose levels will improve Outcome: Progressing   Problem: Nutritional: Goal: Maintenance of adequate nutrition will improve Outcome: Progressing   Problem: Elimination: Goal: Will not experience complications related to bowel motility Outcome: Progressing Goal: Will not experience complications related to urinary retention Outcome: Progressing   Problem: Pain Management: Goal: General experience of comfort will improve Outcome: Progressing   Problem: Safety: Goal: Ability to remain free from injury will improve Outcome: Progressing   Problem: Skin Integrity: Goal: Skin integrity will improve Outcome: Progressing

## 2023-05-11 NOTE — Progress Notes (Signed)
  Progress Note   Patient: Monica Ayers FMW:[id number] DOB: [birth year] DOA: 05/10/2023     1 DOS: the patient was seen and examined on 05/11/2023   Brief hospital admission course:  Monica Ayers is a 63 y.o. female with medical history significant of dementia, hypertension, type 2 diabetes and recent UTI treated with 2 g of Rocephin  and 7 days of bactrim.  Apparently patient was not eating or drinking much at the facility and ended receiving subcutaneous fluid resuscitation.  Patient having transfer secondary to fever and concerns for abdominal cellulitis.   Patient met criteria for sepsis with fever, elevated WBCs and identified source of infection.  Cultures taken, IV antibiotics provided along with fluid resuscitation.   General surgery consulted who have provided I&D at bedside.  There was initial concern for necrotizing fasciitis but this was ruled out after surgical evaluation.  Assessment and Plan: 1-sepsis secondary to abdominal cellulitis -Present at time of admission -Sepsis features resolved -Status post I&D and following clinical response antibiotics will be narrow to the use of Ancef  -Continue to follow culture results -Continue supportive care and follow wound care recommendations by general surgery.  2-type 2 diabetes -Continue to follow CBGs fluctuation and adjust hypoglycemic regimen as needed -Modified carbohydrate diet has been ordered -Continue sliding scale insulin .  3-hypertension -Continue current antihypertensive agents -Follow vital signs.  4-hyperlipidemia -Continue statin.  5-mild dehydration -Patient had received fluid resuscitation -Will continue maintaining adequate oral hydration.  6-history of dementia/cognitive impairment -Continue supportive care and follow clinical response.  7-hypokalemia -Replete electrolytes and follow trend/stability.  Subjective:  Afebrile, no chest pain, no nausea or vomiting.  Physical Exam: Vitals:    05/11/23 0735 05/11/23 0910 05/11/23 1305 05/11/23 1442  BP: (!) 140/87 128/88 (!) 134/102 (!) 151/87  Pulse: 81 87 90   Resp: 14 18 14    Temp: 98.1 F (36.7 C) (!) 97.5 F (36.4 C) 97.7 F (36.5 C)   TempSrc:  Axillary    SpO2: 100% 96% 99%   Weight:      Height:       General exam: Pleasantly confused; able to follow simple commands.  In no acute distress. Respiratory system: Clear to auscultation. Respiratory effort normal.  Good saturation on room air. Cardiovascular system: No rubs or gallops; no JVD.  Sinus rhythm. Gastrointestinal system: Abdomen is nondistended, soft and nontender.  Positive bowel sounds; left mid abdominal quadrant with clean packed wound and clean dressings. Central nervous system: Moving 4 limbs spontaneously.  No focal neurological deficits. Extremities: No cyanosis or clubbing. Skin: No petechiae. Psychiatry: Judgement and insight appear impaired secondary to underlying dementia.  Data Reviewed: CBC: WBCs 10.4, hemoglobin 13.6 and platelet count 276K Basic metabolic panel: Sodium 141, potassium 3.3, chloride 109, bicarb 24, BUN 21, creatinine <0.30 and GFR: > 60  Family Communication: No family at bedside.  Disposition: Status is: Inpatient Remains inpatient appropriate because: Continue IV antibiotics.   Planned Discharge Destination: Skilled nursing facility  Time spent: 50 minutes  Author: Eric Nunnery, MD 05/11/2023 7:22 PM  For on call review [email or web address].

## 2023-05-11 NOTE — Progress Notes (Signed)
 [facility] Progress Note     Subjective: Patient seen and examined.  She is resting comfortably in bed.  She is more awake today but is still not able to answer questions.  Objective: Vital signs in last 24 hours: Temp:  [97.5 F (36.4 C)-98.9 F (37.2 C)] 97.5 F (36.4 C) (01/07 0910) Pulse Rate:  [81-98] 87 (01/07 0910) Resp:  [14-22] 18 (01/07 0910) BP: (118-140)/(70-88) 128/88 (01/07 0910) SpO2:  [93 %-100 %] 96 % (01/07 0910) Weight:  [59.6 kg] 59.6 kg (01/06 2042) Last BM Date : 05/10/23  Intake/Output from previous day: 01/06 0701 - 01/07 0700 In: 2252.7 [P.O.:240; I.V.:1662.7; IV Piggyback:350] Out: 400 [Urine:400] Intake/Output this shift: Total I/O In: 120 [P.O.:120] Out: -   General appearance: alert and no distress GI: abdomen soft, nondistended, no percussion tenderness, non tender to palpation; no rigidity, guarding or rebound tenderness; left abdominal incision site with dried bloody drainage on dressing, no significant erythema or induration, no purulent drainage  Lab Results:  Recent Labs    05/10/23 1002 05/11/23 0345  WBC 12.4* 10.4  HGB 15.5* 13.6  HCT 48.3* 43.0  PLT 342 276   BMET Recent Labs    05/10/23 1002 05/11/23 0345  NA 142 141  K 4.2 3.3*  CL 105 109  CO2 26 24  GLUCOSE 211* 164*  BUN 28* 21  CREATININE 0.62 <0.30*  CALCIUM  9.2 8.7*   PT/INR Recent Labs    05/10/23 1002  LABPROT 14.2  INR 1.1    Studies/Results: CT ABDOMEN PELVIS W CONTRAST Result Date: 05/10/2023 CLINICAL DATA:  Fever with abdominal pain. EXAM: CT ABDOMEN AND PELVIS WITH CONTRAST TECHNIQUE: Multidetector CT imaging of the abdomen and pelvis was performed using the standard protocol following bolus administration of intravenous contrast. RADIATION DOSE REDUCTION: This exam was performed according to the departmental dose-optimization program which includes automated exposure control, adjustment of the mA and/or kV according to patient  size and/or use of iterative reconstruction technique. CONTRAST:  OMNIPAQUE  IOHEXOL  300 MG/ML  SOLN COMPARISON:  None Available. FINDINGS: Lower chest: Dependent atelectasis noted left lower lobe. 6 mm left lower lobe pulmonary nodule identified on image 5/series 3. Hepatobiliary: No suspicious focal abnormality within the liver parenchyma. Layering sludge or noncalcified stones identified in the gallbladder. No gallbladder wall thickening or pericholecystic fluid evident. No intrahepatic or extrahepatic biliary dilation. Pancreas: No focal mass lesion. No dilatation of the main duct. No intraparenchymal cyst. No peripancreatic edema. Spleen: No splenomegaly. No suspicious focal mass lesion. Adrenals/Urinary Tract: No adrenal nodule or mass. 11 mm hypodensity upper pole right kidney cannot be definitively characterized but is statistically most likely a cyst. No followup imaging is recommended. Left kidney unremarkable. No evidence for hydroureter. The urinary bladder appears normal for the degree of distention. Stomach/Bowel: Stomach is unremarkable. No gastric wall thickening. No evidence of outlet obstruction. Duodenum is normally positioned as is the ligament of Treitz. No small bowel wall thickening. No small bowel dilatation. The terminal ileum is normal. The appendix is normal. No gross colonic mass. Focal wall thickening with luminal narrowing in the proximal rectum (axial 71/2) appears smooth in symmetric, most likely peristalsis. Given the large volume of stool distal to this location it does not appear obstructive. Vascular/Lymphatic: There is mild atherosclerotic calcification of the abdominal aorta without aneurysm. There is no gastrohepatic or hepatoduodenal ligament lymphadenopathy. No retroperitoneal or mesenteric lymphadenopathy. No pelvic sidewall lymphadenopathy. Reproductive: The uterus is unremarkable.  There is no adnexal mass. Other: No  intraperitoneal free fluid. Musculoskeletal:  Extensive edema in subcutaneous gas is identified in the anterior left lower abdominal and pelvic wall. There is some overlying skin thickening in the same region. No focal or rim enhancing fluid collection to suggest subcutaneous abscess. IMPRESSION: 1. Extensive edema and subcutaneous gas in the anterior left lower abdominal and pelvic wall with some overlying skin thickening in the same region. Imaging features are compatible with cellulitis. Soft tissue gas is compatible with necrotizing infection of the subcutaneous tissues. No focal or rim enhancing fluid collection to suggest subcutaneous abscess. 2. Layering sludge or noncalcified stones in the gallbladder. No gallbladder wall thickening or pericholecystic fluid evident. 3. 6 mm left lower lobe pulmonary nodule. Non-contrast chest CT at 6-12 months is recommended. If the nodule is stable at time of repeat CT, then future CT at 18-24 months (from today's scan) is considered optional for low-risk patients, but is recommended for high-risk patients. This recommendation follows the consensus statement: Guidelines for Management of Incidental Pulmonary Nodules Detected on CT Images: From the Fleischner Society 2017; Radiology 2017; 284:228-243. 4.  Aortic Atherosclerosis (ICD10-I70.0). Critical Value/emergent results were called by telephone at the time of interpretation on 05/10/2023 at 11:20 am to provider Dr. Albertina, who verbally acknowledged these results. Electronically Signed   By: Camellia Candle M.D.   On: 05/10/2023 11:22   DG Chest Port 1 View Result Date: 05/10/2023 CLINICAL DATA:  Sepsis EXAM: PORTABLE CHEST 1 VIEW COMPARISON:  X-ray 11/09/2021. FINDINGS: No consolidation, pneumothorax or effusion. No edema. Normal cardiopericardial silhouette. Overlapping cardiac leads. Film is rotated to the left. Fixation hardware along the lower cervical spine at the edge of the imaging field. IMPRESSION: No acute cardiopulmonary disease. Electronically Signed   By:  Ranell Bring M.D.   On: 05/10/2023 10:32    Anti-infectives: Anti-infectives (From admission, onward)    Start     Dose/Rate Route Frequency Ordered Stop   05/11/23 0000  vancomycin  (VANCOREADY) IVPB 750 mg/150 mL       Placed in Followed by Linked Group   750 mg 150 mL/hr over 60 Minutes Intravenous Every 12 hours 05/10/23 1145     05/10/23 2000  ceFEPIme  (MAXIPIME ) 2 g in sodium chloride  0.9 % 100 mL IVPB        2 g 200 mL/hr over 30 Minutes Intravenous Every 8 hours 05/10/23 1345     05/10/23 1345  vancomycin  (VANCOCIN ) IVPB 1000 mg/200 mL premix  Status:  Discontinued        1,000 mg 200 mL/hr over 60 Minutes Intravenous  Once 05/10/23 1332 05/10/23 1357   05/10/23 1345  ceFEPIme  (MAXIPIME ) 2 g in sodium chloride  0.9 % 100 mL IVPB  Status:  Discontinued        2 g 200 mL/hr over 30 Minutes Intravenous  Once 05/10/23 1332 05/10/23 1357   05/10/23 1200  vancomycin  (VANCOREADY) IVPB 1250 mg/250 mL       Placed in Followed by Linked Group   1,250 mg 166.7 mL/hr over 90 Minutes Intravenous  Once 05/10/23 1145 05/10/23 1434   05/10/23 1130  ceFEPIme  (MAXIPIME ) 2 g in sodium chloride  0.9 % 100 mL IVPB        2 g 200 mL/hr over 30 Minutes Intravenous  Once 05/10/23 1123 05/10/23 1210   05/10/23 1130  clindamycin  (CLEOCIN ) IVPB 600 mg        600 mg 100 mL/hr over 30 Minutes Intravenous  Once 05/10/23 1123 05/10/23 1256   05/10/23 1015  cefTRIAXone  (  ROCEPHIN ) 2 g in sodium chloride  0.9 % 100 mL IVPB        2 g 200 mL/hr over 30 Minutes Intravenous  Once 05/10/23 1003 05/10/23 1049       Assessment/Plan:  Patient is a 63 year old female who was admitted with low-grade fever and concern for an abdominal wall cellulitis.  -Patient clinically improving with stable vitals and resolution of her leukocytosis with WBC of 10.4 today -Her abdominal incision and drainage site demonstrates bloody output without any evidence of purulence or ' dirty dishwater' drainage.  No surrounding  erythema, induration, or tenderness -Antibiotics per primary team -Continue with local wound care.  Will place wound care orders to start tomorrow -No need for surgical interventions at this time -Care per primary team   LOS: 1 day    Tanaisha Pittman A Kitai Purdom 05/11/2023

## 2023-05-11 NOTE — Consult Note (Signed)
 PHARMACY - PHYSICIAN COMMUNICATION CRITICAL VALUE ALERT - BLOOD CULTURE IDENTIFICATION (BCID)  Monica Ayers is an 63 y.o. female who presented to [facility] on 05/10/2023 with a chief complaint of cellulitis  Assessment: 1 of 4, aerobic bottle, MRSE (not Staph aureus) Likely a contaminant  Name of physician (or Provider) Contacted: Dr. Ricky  Current antibiotics: Cefepime , Vancomycin   Changes to prescribed antibiotics recommended:  Patient is on recommended antibiotics - No changes needed  Results for orders placed or performed during the hospital encounter of 05/10/23  Blood Culture ID Panel (Reflexed) (Collected: 05/10/2023 10:02 AM)  Result Value Ref Range   Enterococcus faecalis NOT DETECTED NOT DETECTED   Enterococcus Faecium NOT DETECTED NOT DETECTED   Listeria monocytogenes NOT DETECTED NOT DETECTED   Staphylococcus species DETECTED (A) NOT DETECTED   Staphylococcus aureus (BCID) NOT DETECTED NOT DETECTED   Staphylococcus epidermidis DETECTED (A) NOT DETECTED   Staphylococcus lugdunensis NOT DETECTED NOT DETECTED   Streptococcus species NOT DETECTED NOT DETECTED   Streptococcus agalactiae NOT DETECTED NOT DETECTED   Streptococcus pneumoniae NOT DETECTED NOT DETECTED   Streptococcus pyogenes NOT DETECTED NOT DETECTED   A.calcoaceticus-baumannii NOT DETECTED NOT DETECTED   Bacteroides fragilis NOT DETECTED NOT DETECTED   Enterobacterales NOT DETECTED NOT DETECTED   Enterobacter cloacae complex NOT DETECTED NOT DETECTED   Escherichia coli NOT DETECTED NOT DETECTED   Klebsiella aerogenes NOT DETECTED NOT DETECTED   Klebsiella oxytoca NOT DETECTED NOT DETECTED   Klebsiella pneumoniae NOT DETECTED NOT DETECTED   Proteus species NOT DETECTED NOT DETECTED   Salmonella species NOT DETECTED NOT DETECTED   Serratia marcescens NOT DETECTED NOT DETECTED   Haemophilus influenzae NOT DETECTED NOT DETECTED   Neisseria meningitidis NOT DETECTED NOT DETECTED   Pseudomonas aeruginosa  NOT DETECTED NOT DETECTED   Stenotrophomonas maltophilia NOT DETECTED NOT DETECTED   Candida albicans NOT DETECTED NOT DETECTED   Candida auris NOT DETECTED NOT DETECTED   Candida glabrata NOT DETECTED NOT DETECTED   Candida krusei NOT DETECTED NOT DETECTED   Candida parapsilosis NOT DETECTED NOT DETECTED   Candida tropicalis NOT DETECTED NOT DETECTED   Cryptococcus neoformans/gattii NOT DETECTED NOT DETECTED   Methicillin resistance mecA/C DETECTED (A) NOT DETECTED    Will M. Lenon, PharmD Clinical Pharmacist 05/11/2023 11:06 AM

## 2023-05-12 DIAGNOSIS — A419 Sepsis, unspecified organism: Secondary | ICD-10-CM | POA: Diagnosis not present

## 2023-05-12 DIAGNOSIS — L0390 Cellulitis, unspecified: Secondary | ICD-10-CM | POA: Diagnosis not present

## 2023-05-12 DIAGNOSIS — T797XXD Traumatic subcutaneous emphysema, subsequent encounter: Secondary | ICD-10-CM | POA: Diagnosis not present

## 2023-05-12 LAB — BASIC METABOLIC PANEL
Anion gap: 12 (ref 5–15)
BUN: 15 mg/dL (ref 8–23)
CO2: 18 mmol/L — ABNORMAL LOW (ref 22–32)
Calcium: 8.5 mg/dL — ABNORMAL LOW (ref 8.9–10.3)
Chloride: 110 mmol/L (ref 98–111)
Creatinine, Ser: 0.35 mg/dL — ABNORMAL LOW (ref 0.44–1.00)
GFR, Estimated: >60 mL/min (ref >60)
Glucose, Bld: 145 mg/dL — ABNORMAL HIGH (ref 70–99)
Potassium: 3.9 mmol/L (ref 3.5–5.1)
Sodium: 140 mmol/L (ref 135–145)

## 2023-05-12 LAB — GLUCOSE, CAPILLARY
Glucose-Capillary: 163 mg/dL — ABNORMAL HIGH (ref 70–99)
Glucose-Capillary: 262 mg/dL — ABNORMAL HIGH (ref 70–99)

## 2023-05-12 MED ORDER — ORAL CARE MOUTH RINSE: 15.0000 mL | OROMUCOSAL | Status: DC | PRN

## 2023-05-12 MED ORDER — DOXYCYCLINE HYCLATE 100 MG PO TABS: 100.0000 mg | ORAL_TABLET | Freq: Two times a day (BID) | ORAL | 0 refills | Status: AC

## 2023-05-12 MED ORDER — CEPHALEXIN 500 MG PO CAPS: 500.0000 mg | ORAL_CAPSULE | Freq: Two times a day (BID) | ORAL | 0 refills | Status: AC

## 2023-05-12 MED ORDER — SENNA 8.6 MG PO TABS: 1.0000 | ORAL_TABLET | Freq: Two times a day (BID) | ORAL | Status: AC | PRN

## 2023-05-12 NOTE — TOC Transition Note (Signed)
 Transition of Care [facility]) - Discharge Note   Patient Details  Name: Monica Ayers MRN: [id number] Date of Birth: 1960-05-23  [facility]) CM/SW Contact:  Lucie Lunger, LCSWA Phone Number: 05/12/2023, 12:51 PM  Clinical Narrative:    CSW updated that pt is medically stable for return to [facility]. CSW spoke to Newton Hamilton with [facility] who states pt can return at D/C. They are ready for pt to return today. CSW spoke to Chance, pts legal guardian who states pt can return. Med necessity sent to floor for RN. CSW to send D/C clinicals to facility once completed by MD. CSW to call for EMS transport when RN is ready. TOC signing off.   Final next level of care: Long Term Nursing Home Barriers to Discharge: Barriers Resolved   Patient Goals and CMS Choice Patient states their goals for this hospitalization and ongoing recovery are:: return to [facility].gov Compare Post Acute Care list provided to:: Legal Guardian Choice offered to / list presented to : [facility] POA / Guardian      Discharge Placement              Patient chooses bed at: [facility] Patient to be transferred to facility by: EMS Name of family member notified: LG Chance Patient and family notified of of transfer: 05/12/23  Discharge Plan and Services Additional resources added to the After Visit Summary for   In-house Referral: Clinical Social Work   Post Acute Care Choice: Resumption of Svcs/PTA Provider                               Social Drivers of Health (SDOH) Interventions SDOH Screenings   Food Insecurity: Patient Unable To Answer (05/10/2023)  Housing: Patient Unable To Answer (05/10/2023)  Transportation Needs: Patient Unable To Answer (05/10/2023)  Utilities: Patient Unable To Answer (05/10/2023)  Depression (PHQ2-9): Low Risk  (06/23/2019)  Tobacco Use: Medium Risk (05/10/2023)     Readmission Risk Interventions     No data to display

## 2023-05-12 NOTE — Progress Notes (Signed)
 [facility] Progress Note     Subjective: Patient seen and examined.  She is resting comfortably in bed. No issues overnight.  Objective: Vital signs in last 24 hours: Temp:  [97.6 F (36.4 C)-98.6 F (37 C)] 97.6 F (36.4 C) (01/08 0835) Pulse Rate:  [84-90] 84 (01/08 0835) Resp:  [14-20] 19 (01/08 0835) BP: (132-159)/(80-102) 132/92 (01/08 0835) SpO2:  [96 %-99 %] 99 % (01/08 0835) Last BM Date : 05/10/23  Intake/Output from previous day: 01/07 0701 - 01/08 0700 In: 680 [P.O.:530; IV Piggyback:150] Out: 500 [Urine:500] Intake/Output this shift: Total I/O In: 240 [P.O.:240] Out: -   General appearance: alert and no distress GI: Abdomen soft, nondistended, no percussion tenderness, nontender to palpation; no rigidity, guarding, or rebound tenderness; left side of abdomen without erythema or induration, incision site with healthy tissue at base, no drainage  Lab Results:  Recent Labs    05/10/23 1002 05/11/23 0345  WBC 12.4* 10.4  HGB 15.5* 13.6  HCT 48.3* 43.0  PLT 342 276   BMET Recent Labs    05/11/23 0345 05/12/23 0430  NA 141 140  K 3.3* 3.9  CL 109 110  CO2 24 18*  GLUCOSE 164* 145*  BUN 21 15  CREATININE <0.30* 0.35*  CALCIUM  8.7* 8.5*   PT/INR Recent Labs    05/10/23 1002  LABPROT 14.2  INR 1.1    Studies/Results: No results found.  Anti-infectives: Anti-infectives (From admission, onward)    Start     Dose/Rate Route Frequency Ordered Stop   05/11/23 2000  ceFAZolin  (ANCEF ) IVPB 1 g/50 mL premix        1 g 100 mL/hr over 30 Minutes Intravenous Every 8 hours 05/11/23 1204     05/11/23 0000  vancomycin  (VANCOREADY) IVPB 750 mg/150 mL  Status:  Discontinued       Placed in Followed by Linked Group   750 mg 150 mL/hr over 60 Minutes Intravenous Every 12 hours 05/10/23 1145 05/11/23 1204   05/10/23 2000  ceFEPIme  (MAXIPIME ) 2 g in sodium chloride  0.9 % 100 mL IVPB  Status:  Discontinued        2 g 200 mL/hr over 30  Minutes Intravenous Every 8 hours 05/10/23 1345 05/11/23 1204   05/10/23 1345  vancomycin  (VANCOCIN ) IVPB 1000 mg/200 mL premix  Status:  Discontinued        1,000 mg 200 mL/hr over 60 Minutes Intravenous  Once 05/10/23 1332 05/10/23 1357   05/10/23 1345  ceFEPIme  (MAXIPIME ) 2 g in sodium chloride  0.9 % 100 mL IVPB  Status:  Discontinued        2 g 200 mL/hr over 30 Minutes Intravenous  Once 05/10/23 1332 05/10/23 1357   05/10/23 1200  vancomycin  (VANCOREADY) IVPB 1250 mg/250 mL       Placed in Followed by Linked Group   1,250 mg 166.7 mL/hr over 90 Minutes Intravenous  Once 05/10/23 1145 05/10/23 1434   05/10/23 1130  ceFEPIme  (MAXIPIME ) 2 g in sodium chloride  0.9 % 100 mL IVPB        2 g 200 mL/hr over 30 Minutes Intravenous  Once 05/10/23 1123 05/10/23 1210   05/10/23 1130  clindamycin  (CLEOCIN ) IVPB 600 mg        600 mg 100 mL/hr over 30 Minutes Intravenous  Once 05/10/23 1123 05/10/23 1256   05/10/23 1015  cefTRIAXone  (ROCEPHIN ) 2 g in sodium chloride  0.9 % 100 mL IVPB        2 g 200 mL/hr  over 30 Minutes Intravenous  Once 05/10/23 1003 05/10/23 1049       Assessment/Plan:  Patient is a 63 year old female who was admitted with low-grade fever and concern for an abdominal wall cellulitis.   -Patient remains clinically stable -Her abdominal incision and drainage site stable without drainage. No surrounding erythema, induration, or tenderness -Antibiotics per primary team -Continue with local wound care. Orders in. Discharge wound care orders also in place -No need for surgical interventions at this time -Care per primary team -General surgery to sign off.  Please call with questions or concerns   LOS: 2 days    Riccardo Holeman A Yuki Purves 05/12/2023

## 2023-05-12 NOTE — Discharge Summary (Signed)
 Physician Discharge Summary   Patient: Monica Ayers MRN: [id number] DOB: 1961/05/02  Admit date:     05/10/2023  Discharge date: 05/12/23  Discharge Physician: Eric Nunnery   PCP: Wilhelmina Cedar   Recommendations at discharge:  Repeat basic metabolic panel to follow ultralights renal function Avoid the use of subcutaneous fluid resuscitation -Continue close monitoring of patient's abdominal wound healing process. Follow-up final report of cultures results with further adjustment to antibiotic therapy decided as needed.   Discharge Diagnoses: Principal Problem:   Sepsis due to cellulitis [facility]) Active Problems:   Subcutaneous emphysema [facility])  Brief Hospital admission course: Monica Ayers is a 63 y.o. female with medical history significant of dementia, hypertension, type 2 diabetes and recent UTI treated with 2 g of Rocephin  and 7 days of bactrim.  Apparently patient was not eating or drinking much at the facility and ended receiving subcutaneous fluid resuscitation.  Patient having transfer secondary to fever and concerns for abdominal cellulitis.   Patient met criteria for sepsis with fever, elevated WBCs and identified source of infection.  Cultures taken, IV antibiotics provided along with fluid resuscitation.   General surgery consulted who have provided I&D at bedside.  There was initial concern for necrotizing fasciitis but this was ruled out after surgical evaluation.    Assessment and Plan: 1-sepsis secondary to abdominal cellulitis -Present at time of admission -Sepsis features resolved -Status post I&D and IV antibiotics. -Continue to follow culture results -Patient antibiotics has been transitioned to oral doxycycline  and Keflex  at discharge.   2-type 2 diabetes -Continue to follow CBGs fluctuation and adjust hypoglycemic regimen as needed -Modified carbohydrate diet has been discussed with patient -Resume oral hypoglycemic agents at discharge.    3-hypertension -Continue current antihypertensive agents -Follow vital signs.   4-hyperlipidemia -Continue statin.   5-mild dehydration -Patient received IV fluid resuscitation -Adequate oral hydration has been recommended at discharge..   6-history of dementia/cognitive impairment -Continue supportive care and follow clinical response.   7-hypokalemia -Replete electrolytes and follow trend/stability.   Consultants: General surgery Procedures performed: Status post I&D; see below for x-ray reports. Disposition: Skilled nursing facility Diet recommendation: Dysphagia 3 diet (heart healthy/modified carbohydrate).  DISCHARGE MEDICATION: Allergies as of 05/12/2023       Reactions   Latex Rash        Medication List     STOP taking these medications    LORazepam  2 MG tablet Commonly known as: ATIVAN    metoprolol  tartrate 25 MG tablet Commonly known as: LOPRESSOR        TAKE these medications    acetaminophen  325 MG tablet Commonly known as: TYLENOL  Take 2 tablets (650 mg total) by mouth every 6 (six) hours as needed for mild pain, fever or headache (or Fever >/= 101).   atorvastatin  40 MG tablet Commonly known as: LIPITOR Take 1 tablet (40 mg total) by mouth daily at 6 PM.   cephALEXin  500 MG capsule Commonly known as: KEFLEX  Take 1 capsule (500 mg total) by mouth 2 (two) times daily for 7 days.   cholecalciferol  25 MCG (1000 UNIT) tablet Commonly known as: VITAMIN D3 Take 2,000 Units by mouth daily.   doxycycline  100 MG tablet Commonly known as: VIBRA -TABS Take 1 tablet (100 mg total) by mouth 2 (two) times daily for 7 days.   lisinopril  10 MG tablet Commonly known as: ZESTRIL  Take 10 mg by mouth daily.   melatonin 3 MG Tabs tablet Take 9 mg by mouth at bedtime.   metFORMIN  500  MG tablet Commonly known as: GLUCOPHAGE  Take 1 tablet (500 mg total) by mouth daily. What changed:  how much to take when to take this   methocarbamol 750 MG  tablet Commonly known as: ROBAXIN Take 750 mg by mouth every evening.   potassium chloride  10 MEQ tablet Commonly known as: KLOR-CON  Take 1 tablet (10 mEq total) by mouth daily. What changed: when to take this   saccharomyces boulardii 250 MG capsule Commonly known as: FLORASTOR Take 250 mg by mouth 2 (two) times daily.   senna 8.6 MG Tabs tablet Commonly known as: SENOKOT Take 1 tablet (8.6 mg total) by mouth 2 (two) times daily as needed for mild constipation. What changed:  when to take this reasons to take this               Discharge Care Instructions  (From admission, onward)           Start     Ordered   05/12/23 0000  Discharge wound care:       Comments: Wound Care Instructions: -Remove external dressing and packing from left abdominal wall incision and drainage site.   -Cleanse with water -Pack with a dry 2x2 and cover with 4x4 and medipore tape -If area no longer large enough for packing, cover with 4x4 and medipore tape -Avoid subcutaneous fluid administration.   05/12/23 1257            Discharge Exam: Filed Weights   05/10/23 0942 05/10/23 2042  Weight: 64.5 kg 59.6 kg   General exam: Pleasantly confused; able to follow simple commands.  In no acute distress. Respiratory system: Clear to auscultation. Respiratory effort normal.  Good saturation on room air. Cardiovascular system: No rubs or gallops; no JVD.  Sinus rhythm. Gastrointestinal system: Abdomen is nondistended, soft and nontender.  Positive bowel sounds; left mid abdominal quadrant with clean packed wound and clean dressings. Central nervous system: Moving 4 limbs spontaneously.  No focal neurological deficits. Extremities: No cyanosis or clubbing. Skin: No petechiae. Psychiatry: Judgement and insight appear impaired secondary to underlying dementia.  Condition at discharge: stable  The results of significant diagnostics from this hospitalization (including imaging,  microbiology, ancillary and laboratory) are listed below for reference.   Imaging Studies: CT ABDOMEN PELVIS W CONTRAST Result Date: 05/10/2023 CLINICAL DATA:  Fever with abdominal pain. EXAM: CT ABDOMEN AND PELVIS WITH CONTRAST TECHNIQUE: Multidetector CT imaging of the abdomen and pelvis was performed using the standard protocol following bolus administration of intravenous contrast. RADIATION DOSE REDUCTION: This exam was performed according to the departmental dose-optimization program which includes automated exposure control, adjustment of the mA and/or kV according to patient size and/or use of iterative reconstruction technique. CONTRAST:  OMNIPAQUE  IOHEXOL  300 MG/ML  SOLN COMPARISON:  None Available. FINDINGS: Lower chest: Dependent atelectasis noted left lower lobe. 6 mm left lower lobe pulmonary nodule identified on image 5/series 3. Hepatobiliary: No suspicious focal abnormality within the liver parenchyma. Layering sludge or noncalcified stones identified in the gallbladder. No gallbladder wall thickening or pericholecystic fluid evident. No intrahepatic or extrahepatic biliary dilation. Pancreas: No focal mass lesion. No dilatation of the main duct. No intraparenchymal cyst. No peripancreatic edema. Spleen: No splenomegaly. No suspicious focal mass lesion. Adrenals/Urinary Tract: No adrenal nodule or mass. 11 mm hypodensity upper pole right kidney cannot be definitively characterized but is statistically most likely a cyst. No followup imaging is recommended. Left kidney unremarkable. No evidence for hydroureter. The urinary bladder appears normal for the degree  of distention. Stomach/Bowel: Stomach is unremarkable. No gastric wall thickening. No evidence of outlet obstruction. Duodenum is normally positioned as is the ligament of Treitz. No small bowel wall thickening. No small bowel dilatation. The terminal ileum is normal. The appendix is normal. No gross colonic mass. Focal wall thickening  with luminal narrowing in the proximal rectum (axial 71/2) appears smooth in symmetric, most likely peristalsis. Given the large volume of stool distal to this location it does not appear obstructive. Vascular/Lymphatic: There is mild atherosclerotic calcification of the abdominal aorta without aneurysm. There is no gastrohepatic or hepatoduodenal ligament lymphadenopathy. No retroperitoneal or mesenteric lymphadenopathy. No pelvic sidewall lymphadenopathy. Reproductive: The uterus is unremarkable.  There is no adnexal mass. Other: No intraperitoneal free fluid. Musculoskeletal: Extensive edema in subcutaneous gas is identified in the anterior left lower abdominal and pelvic wall. There is some overlying skin thickening in the same region. No focal or rim enhancing fluid collection to suggest subcutaneous abscess. IMPRESSION: 1. Extensive edema and subcutaneous gas in the anterior left lower abdominal and pelvic wall with some overlying skin thickening in the same region. Imaging features are compatible with cellulitis. Soft tissue gas is compatible with necrotizing infection of the subcutaneous tissues. No focal or rim enhancing fluid collection to suggest subcutaneous abscess. 2. Layering sludge or noncalcified stones in the gallbladder. No gallbladder wall thickening or pericholecystic fluid evident. 3. 6 mm left lower lobe pulmonary nodule. Non-contrast chest CT at 6-12 months is recommended. If the nodule is stable at time of repeat CT, then future CT at 18-24 months (from today's scan) is considered optional for low-risk patients, but is recommended for high-risk patients. This recommendation follows the consensus statement: Guidelines for Management of Incidental Pulmonary Nodules Detected on CT Images: From the Fleischner Society 2017; Radiology 2017; 284:228-243. 4.  Aortic Atherosclerosis (ICD10-I70.0). Critical Value/emergent results were called by telephone at the time of interpretation on 05/10/2023 at  11:20 am to provider Dr. Albertina, who verbally acknowledged these results. Electronically Signed   By: Camellia Candle M.D.   On: 05/10/2023 11:22   DG Chest Port 1 View Result Date: 05/10/2023 CLINICAL DATA:  Sepsis EXAM: PORTABLE CHEST 1 VIEW COMPARISON:  X-ray 11/09/2021. FINDINGS: No consolidation, pneumothorax or effusion. No edema. Normal cardiopericardial silhouette. Overlapping cardiac leads. Film is rotated to the left. Fixation hardware along the lower cervical spine at the edge of the imaging field. IMPRESSION: No acute cardiopulmonary disease. Electronically Signed   By: Ranell Bring M.D.   On: 05/10/2023 10:32    Microbiology: Results for orders placed or performed during the hospital encounter of 05/10/23  Blood Culture (routine x 2)     Status: Abnormal (Preliminary result)   Collection Time: 05/10/23 10:02 AM   Specimen: BLOOD  Result Value Ref Range Status   Specimen Description   Final    BLOOD RIGHT ANTECUBITAL Performed at [facility], [address], [place], [place] [address]    Special Requests   Final    BOTTLES DRAWN AEROBIC AND ANAEROBIC Blood Culture results may not be optimal due to an inadequate volume of blood received in culture bottles Performed at [facility], [address] [place], [place] [address]    Culture  Setup Time   Final    GRAM POSITIVE COCCI AEROBIC BOTTLE ONLY Gram Stain Report Called to,Read Back By and Verified With: SULY VILLALOBOS [id number], VIRAY,J CRITICAL RESULT CALLED TO, READ BACK BY AND VERIFIED WITH: [facility] WILL ANDERSON [id number] AT 1035 BY CM  Culture (A)  Final    STAPHYLOCOCCUS HOMINIS THE SIGNIFICANCE OF ISOLATING THIS ORGANISM FROM A SINGLE SET OF BLOOD CULTURES WHEN MULTIPLE SETS ARE DRAWN IS UNCERTAIN. PLEASE NOTIFY THE MICROBIOLOGY DEPARTMENT WITHIN ONE WEEK IF SPECIATION AND SENSITIVITIES ARE REQUIRED. CULTURE REINCUBATED FOR BETTER GROWTH Performed at [facility], [address], [place], [place]  [address]    Report Status PENDING  Incomplete  Blood Culture ID Panel (Reflexed)     Status: Abnormal   Collection Time: 05/10/23 10:02 AM  Result Value Ref Range Status   Enterococcus faecalis NOT DETECTED NOT DETECTED Final   Enterococcus Faecium NOT DETECTED NOT DETECTED Final   Listeria monocytogenes NOT DETECTED NOT DETECTED Final   Staphylococcus species DETECTED (A) NOT DETECTED Final    Comment: CRITICAL RESULT CALLED TO, READ BACK BY AND VERIFIED WITH: PHARMD WILL ANDERSON [id number] AT 1035 BY CM    Staphylococcus aureus (BCID) NOT DETECTED NOT DETECTED Final   Staphylococcus epidermidis DETECTED (A) NOT DETECTED Final    Comment: Methicillin (oxacillin) resistant coagulase negative staphylococcus. Possible blood culture contaminant (unless isolated from more than one blood culture draw or clinical case suggests pathogenicity). No antibiotic treatment is indicated for blood  culture contaminants. CRITICAL RESULT CALLED TO, READ BACK BY AND VERIFIED WITH: PHARMD WILL ANDEROSN [id number] AT 1035 BY CM    Staphylococcus lugdunensis NOT DETECTED NOT DETECTED Final   Streptococcus species NOT DETECTED NOT DETECTED Final   Streptococcus agalactiae NOT DETECTED NOT DETECTED Final   Streptococcus pneumoniae NOT DETECTED NOT DETECTED Final   Streptococcus pyogenes NOT DETECTED NOT DETECTED Final   A.calcoaceticus-baumannii NOT DETECTED NOT DETECTED Final   Bacteroides fragilis NOT DETECTED NOT DETECTED Final   Enterobacterales NOT DETECTED NOT DETECTED Final   Enterobacter cloacae complex NOT DETECTED NOT DETECTED Final   Escherichia coli NOT DETECTED NOT DETECTED Final   Klebsiella aerogenes NOT DETECTED NOT DETECTED Final   Klebsiella oxytoca NOT DETECTED NOT DETECTED Final   Klebsiella pneumoniae NOT DETECTED NOT DETECTED Final   Proteus species NOT DETECTED NOT DETECTED Final   Salmonella species NOT DETECTED NOT DETECTED Final   Serratia marcescens NOT DETECTED NOT DETECTED Final    Haemophilus influenzae NOT DETECTED NOT DETECTED Final   Neisseria meningitidis NOT DETECTED NOT DETECTED Final   Pseudomonas aeruginosa NOT DETECTED NOT DETECTED Final   Stenotrophomonas maltophilia NOT DETECTED NOT DETECTED Final   Candida albicans NOT DETECTED NOT DETECTED Final   Candida auris NOT DETECTED NOT DETECTED Final   Candida glabrata NOT DETECTED NOT DETECTED Final   Candida krusei NOT DETECTED NOT DETECTED Final   Candida parapsilosis NOT DETECTED NOT DETECTED Final   Candida tropicalis NOT DETECTED NOT DETECTED Final   Cryptococcus neoformans/gattii NOT DETECTED NOT DETECTED Final   Methicillin resistance mecA/C DETECTED (A) NOT DETECTED Final    Comment: CRITICAL RESULT CALLED TO, READ BACK BY AND VERIFIED WITH: [facility] WILL LENON [id number] AT 1035 BY CM Performed at [facility], [address], [place], [place] [address]   Blood Culture (routine x 2)     Status: None (Preliminary result)   Collection Time: 05/10/23 10:11 AM   Specimen: BLOOD  Result Value Ref Range Status   Specimen Description BLOOD BLOOD LEFT HAND  Final   Special Requests   Final    BOTTLES DRAWN AEROBIC AND ANAEROBIC Blood Culture adequate volume   Culture   Final    NO GROWTH 2 DAYS Performed at [facility], [address],  [place], [place] [address]    Report Status PENDING  Incomplete  Resp panel by RT-PCR (RSV, Flu A&B, Covid) Anterior Nasal Swab     Status: None   Collection Time: 05/10/23 12:28 PM   Specimen: Anterior Nasal Swab  Result Value Ref Range Status   SARS Coronavirus 2 by RT PCR NEGATIVE NEGATIVE Final    Comment: (NOTE) SARS-CoV-2 target nucleic acids are NOT DETECTED.  The SARS-CoV-2 RNA is generally detectable in upper respiratory specimens during the acute phase of infection. The lowest concentration of SARS-CoV-2 viral copies this assay can detect is 138 copies/mL. A negative result does not preclude SARS-Cov-2 infection and should not be used as the sole basis  for treatment or other patient management decisions. A negative result may occur with  improper specimen collection/handling, submission of specimen other than nasopharyngeal swab, presence of viral mutation(s) within the areas targeted by this assay, and inadequate number of viral copies(<138 copies/mL). A negative result must be combined with clinical observations, patient history, and epidemiological information. The expected result is Negative.  Fact Sheet for Patients:  bloggercourse.com  Fact Sheet for Healthcare Providers:  seriousbroker.it  This test is no t yet approved or cleared by the [place]  FDA and  has been authorized for detection and/or diagnosis of SARS-CoV-2 by FDA under an Emergency Use Authorization (EUA). This EUA will remain  in effect (meaning this test can be used) for the duration of the COVID-19 declaration under Section 564(b)(1) of the Act, 21 U.S.C.section 360bbb-3(b)(1), unless the authorization is terminated  or revoked sooner.       Influenza A by PCR NEGATIVE NEGATIVE Final   Influenza B by PCR NEGATIVE NEGATIVE Final    Comment: (NOTE) The Xpert Xpress SARS-CoV-2/FLU/RSV plus assay is intended as an aid in the diagnosis of influenza from Nasopharyngeal swab specimens and should not be used as a sole basis for treatment. Nasal washings and aspirates are unacceptable for Xpert Xpress SARS-CoV-2/FLU/RSV testing.  Fact Sheet for Patients: bloggercourse.com  Fact Sheet for Healthcare Providers: seriousbroker.it  This test is not yet approved or cleared by the [place]  FDA and has been authorized for detection and/or diagnosis of SARS-CoV-2 by FDA under an Emergency Use Authorization (EUA). This EUA will remain in effect (meaning this test can be used) for the duration of the COVID-19 declaration under Section 564(b)(1) of the Act, 21  U.S.C. section 360bbb-3(b)(1), unless the authorization is terminated or revoked.     Resp Syncytial Virus by PCR NEGATIVE NEGATIVE Final    Comment: (NOTE) Fact Sheet for Patients: bloggercourse.com  Fact Sheet for Healthcare Providers: seriousbroker.it  This test is not yet approved or cleared by the [place]  FDA and has been authorized for detection and/or diagnosis of SARS-CoV-2 by FDA under an Emergency Use Authorization (EUA). This EUA will remain in effect (meaning this test can be used) for the duration of the COVID-19 declaration under Section 564(b)(1) of the Act, 21 U.S.C. section 360bbb-3(b)(1), unless the authorization is terminated or revoked.  Performed at [facility], [address], [place], [place] [address]     Labs: CBC: Recent Labs  Lab 05/10/23 1002 05/11/23 0345  WBC 12.4* 10.4  NEUTROABS 9.1*  --   HGB 15.5* 13.6  HCT 48.3* 43.0  MCV 89.6 91.1  PLT 342 276   Basic Metabolic Panel: Recent Labs  Lab 05/10/23 1002 05/11/23 0345 05/12/23 0430  NA 142 141 140  K 4.2 3.3* 3.9  CL 105 109 110  CO2  26 24 18*  GLUCOSE 211* 164* 145*  BUN 28* 21 15  CREATININE 0.62 <0.30* 0.35*  CALCIUM  9.2 8.7* 8.5*   Liver Function Tests: Recent Labs  Lab 05/10/23 1002  AST 17  ALT 16  ALKPHOS 56  BILITOT 0.5  PROT 7.3  ALBUMIN 3.4*   CBG: Recent Labs  Lab 05/11/23 1125 05/11/23 1617 05/11/23 2121 05/12/23 0733 05/12/23 1123  GLUCAP 138* 130* 135* 163* 262*    Discharge time spent: greater than 30 minutes.  Signed: Eric Nunnery, MD [facility] 05/12/2023

## 2023-05-12 NOTE — Discharge Instructions (Signed)
 Wound Care Instructions: -Remove external dressing and packing from left abdominal wall incision and drainage site.   -Cleanse with water -Pack with a dry 2x2 and cover with 4x4 and medipore tape -If area no longer large enough for packing, cover with 4x4 and medipore tape -Avoid subcutaneous fluid administration

## 2023-05-12 NOTE — Plan of Care (Signed)
   Problem: Education: Goal: Ability to describe self-care measures that may prevent or decrease complications (Diabetes Survival Skills Education) will improve Outcome: Not Progressing Goal: Individualized Educational Video(s) Outcome: Not Progressing

## 2023-05-12 NOTE — Progress Notes (Signed)
 Patient being discharged to [facility] nursing [facility],report called and given to [employer]. IV discontinued,catheter intact. [facility] to transport patient to awaiting facility.

## 2023-05-12 NOTE — Progress Notes (Signed)
 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 Dressing dry and intact.Plan of care on going.

## 2023-05-15 LAB — CULTURE, BLOOD (ROUTINE X 2)
Culture: NO GROWTH
Special Requests: ADEQUATE

## 2023-07-02 ENCOUNTER — Encounter: Payer: Self-pay | Admitting: Internal Medicine

## 2023-07-02 ENCOUNTER — Ambulatory Visit: Payer: Medicare Other | Admitting: Internal Medicine

## 2023-07-02 VITALS — BP 141/86 | HR 106 | Ht 67.0 in | Wt 131.0 lb

## 2023-07-02 DIAGNOSIS — R0609 Other forms of dyspnea: Secondary | ICD-10-CM | POA: Diagnosis not present

## 2023-07-02 DIAGNOSIS — Z87891 Personal history of nicotine dependence: Secondary | ICD-10-CM

## 2023-07-02 DIAGNOSIS — I10 Essential (primary) hypertension: Secondary | ICD-10-CM

## 2023-07-02 HISTORY — DX: Other forms of dyspnea: R06.09

## 2023-07-02 MED ORDER — VALSARTAN 160 MG PO TABS: 160.0000 mg | ORAL_TABLET | Freq: Every day | ORAL | 11 refills | Status: AC

## 2023-07-02 NOTE — Progress Notes (Signed)
 Monica Ayers, female    DOB: 04/19/1961    MRN: [id number]   Brief patient profile:  62   yowf quit smoking 2020  at time of admit for CVA referred to pulmonary clinic in [place]  07/02/2023 by Dr Antonietta Jewel  for sob    Nothing known about  resp status prior to cva > [facility] > [facility]  w/c hoyer dep/ non verbal and general healthy downhill since 2024/ NCB status    History of Present Illness  07/02/2023  Pulmonary/ 1st office eval/ [facility] / [facility] on ACEi Chief Complaint  Patient presents with   Consult    Consult , discuss ct scan   Dyspnea:  few minutes of spells of sob not related to activity/ eating / position Cough: D 3 diet  s obvious asp Sleep: bed is flat one pillow no noct resp cc  SABA use: none  02: none     No obvious patterns in  day to day or daytime  variability or assoc excess/ purulent sputum or mucus plugs or hemoptysis or cp or chest tightness, subjective wheeze or overt sinus or hb symptoms.    Also denies any obvious fluctuation of symptoms with weather or environmental changes or other aggravating or alleviating factors except as outlined above   No unusual exposure hx or h/o childhood pna/ asthma or knowledge of premature birth.  Current Allergies, Complete Past Medical History, Past Surgical History, Family History, and Social History were reviewed in [employer] record.    ROS reported by staff: Dysphagia, poor appetite       Outpatient Medications Prior to Visit  Medication Sig Dispense Refill   acetaminophen (TYLENOL) 325 MG tablet Take 2 tablets (650 mg total) by mouth every 6 (six) hours as needed for mild pain, fever or headache (or Fever >/= 101). 15 tablet 0   AMINO ACIDS COMPLEX PO Take by mouth.     ascorbic acid (VITAMIN C) 500 MG tablet Take 500 mg by mouth daily.     cholecalciferol (VITAMIN D3) 25 MCG (1000 UNIT) tablet Take 2,000 Units by mouth daily.     lisinopril (ZESTRIL) 10 MG tablet  Take 10 mg by mouth daily.     melatonin 3 MG TABS tablet Take 9 mg by mouth at bedtime.     methocarbamol (ROBAXIN) 750 MG tablet Take 750 mg by mouth every evening.     saccharomyces boulardii (FLORASTOR) 250 MG capsule Take 250 mg by mouth 2 (two) times daily.     senna (SENOKOT) 8.6 MG TABS tablet Take 1 tablet (8.6 mg total) by mouth 2 (two) times daily as needed for mild constipation.     Zinc 220 (50 Zn) MG CAPS Take by mouth.     atorvastatin (LIPITOR) 40 MG tablet Take 1 tablet (40 mg total) by mouth daily at 6 PM. 30 tablet 1   metFORMIN (GLUCOPHAGE) 500 MG tablet Take 1 tablet (500 mg total) by mouth daily. (Patient taking differently: Take 1,000 mg by mouth 2 (two) times daily with a meal.) 30 tablet 11   potassium chloride (KLOR-CON) 10 MEQ tablet Take 1 tablet (10 mEq total) by mouth daily. (Patient taking differently: Take 10 mEq by mouth every morning.) 30 tablet 2   No facility-administered medications prior to visit.    Past Medical History:  Diagnosis Date   Allergic state 10/02/2016   Allergy    Dementia (HCC)    Diabetes mellitus without complication (  HCC)    Hypertension    Hyperthyroidism    Lactic acidosis 11/17/2018   Pressure injury of skin 11/18/2018   Sepsis (HCC) 12/28/2018   Sepsis due to undetermined organism (HCC) 11/17/2018      Objective:     BP (!) 141/86   Pulse (!) 106   Ht 5\' 7"  (1.702 m)   Wt 131 lb (59.4 kg) Comment: from past visit  SpO2 94% Comment: room air  BMI 20.52 kg/m   SpO2: 94 % (room air) RA  Slt thrush, not interactive/ w/c bound elderly wf nad   HEENT : Oropharynx  mild thrush         NECK :  without  apparent JVD/ palpable Nodes/TM    LUNGS: no acc muscle use,  Nl contour chest with distant bs    CV:  RRR  no s3 or murmur or increase in P2, and no edema   ABD:  soft and nontender   MS:   sits slumped over in w/c   SKIN: warm and dry without lesions    NEURO:  appears alert but stares blankly, no eye contact  or response to verbal.      I personally reviewed images and agree with radiology impression as follows:  CXR:   portable 05/10/23  No acute finings  CT abd 05/10/2023  Dependent atelectasis noted left lower lobe . 6 mm left lower lobe pulmonary nodule identified on image 5/series 3.  Assessment   DOE (dyspnea on exertion) Quit smoking 2020 with CVA/ dementia - Onset ? 2024 while on ACEi > d/c 07/02/2023   She does not have any typical features of copd but is not ambulatory so hard to be sure and not likely to cooperate with pfts so rec   Try change ACEi to ARB ad f/u here prn if spells continue   Be aware of risk of aspiration with regular f/u by Speech therapy.  Essential hypertension D/c ACEi 07/02/2023 due to unexplained sob   In the best review of chronic cough to date ( NEJM 2016 375 4132-4401) ,  ACEi are now felt to cause cough in up to  20% of pts which is a 4 fold increase from previous reports and does not include the variety of non-specific complaints we see in pulmonary clinic in pts on ACEi but previously attributed to another dx like  Copd/asthma and  include PNDS, throat and chest congestion, "bronchitis", unexplained dyspnea and noct "strangling" sensations, and hoarseness, but also  atypical /refractory GERD symptoms like dysphagia and "bad heartburn"   The only way I know  to prove this is not an "ACEi Case" is a trial off ACEi x a minimum of 6 weeks then regroup.    >>> try valsartan 160 mg one daily and f/u in 6 weeks if not improved  Former cigarette smoker with SPN LLL Quit smoking 2020 - SPN x 6 mm noted on 05/10/23 abd ct > No f/u indicated at present health status (NCB with progressive cognifive/physical decline per staff at SNF   Rec As above, unless marked improvement in clinical status, which seems unlikely, there is no indication for surveillance of incidentally noted pulmonary nodules in this setting.          Each maintenance medication was reviewed  in detail including emphasizing most importantly the difference between maintenance and prns and under what circumstances the prns are to be triggered using an action plan format where appropriate.  Total time for H  and P, chart review,  collaborating with staff present at time  of ov,  and generating customized AVS unique to this office visit / same day charting = 46 min       Sandrea Hughs, MD 07/02/2023

## 2023-07-02 NOTE — Assessment & Plan Note (Addendum)
 Quit smoking 2020 with CVA/ dementia - Onset ? 2024 while on ACEi > d/c 07/02/2023   She does not have any typical features of copd but is not ambulatory so hard to be sure and not likely to cooperate with pfts so rec   Try change ACEi to ARB ad f/u here prn if spells continue   Be aware of risk of aspiration with regular f/u by Speech therapy.

## 2023-07-02 NOTE — Assessment & Plan Note (Addendum)
 D/c ACEi 07/02/2023 due to unexplained sob   In the best review of chronic cough to date ( NEJM 2016 375 0981-1914) ,  ACEi are now felt to cause cough in up to  20% of pts which is a 4 fold increase from previous reports and does not include the variety of non-specific complaints we see in pulmonary clinic in pts on ACEi but previously attributed to another dx like  Copd/asthma and  include PNDS, throat and chest congestion, "bronchitis", unexplained dyspnea and noct "strangling" sensations, and hoarseness, but also  atypical /refractory GERD symptoms like dysphagia and "bad heartburn"   The only way I know  to prove this is not an "ACEi Case" is a trial off ACEi x a minimum of 6 weeks then regroup.    >>> try valsartan 160 mg one daily and f/u in 6 weeks if not improved

## 2023-07-02 NOTE — Patient Instructions (Addendum)
 Stop lisinopril and start valsartan 160 mg one daily in its place   No pulmonary follow up planned  - return as needed

## 2023-07-03 DIAGNOSIS — Z87891 Personal history of nicotine dependence: Secondary | ICD-10-CM | POA: Insufficient documentation

## 2023-07-03 NOTE — Assessment & Plan Note (Signed)
 Quit smoking 2020 - SPN x 6 mm noted on 05/10/23 abd ct > No f/u indicated at present health status (NCB with progressive cognifive/physical decline per staff at SNF   Rec As above, unless marked improvement in clinical status, which seems unlikely, there is no indication for surveillance of incidentally noted pulmonary nodules in this setting.          Each maintenance medication was reviewed in detail including emphasizing most importantly the difference between maintenance and prns and under what circumstances the prns are to be triggered using an action plan format where appropriate.  Total time for H and P, chart review,  collaborating with staff present at time  of ov,  and generating customized AVS unique to this office visit / same day charting = 46 min

## 2023-07-25 ENCOUNTER — Encounter (HOSPITAL_COMMUNITY): Payer: Self-pay

## 2023-07-25 ENCOUNTER — Other Ambulatory Visit: Payer: Self-pay

## 2023-07-25 ENCOUNTER — Emergency Department (HOSPITAL_COMMUNITY)
Admission: EM | Admit: 2023-07-25 | Discharge: 2023-07-26 | Disposition: A | Discharge status: Home / Self Care | Attending: Emergency Medicine | Admitting: Emergency Medicine

## 2023-07-25 DIAGNOSIS — S0083XA Contusion of other part of head, initial encounter: Secondary | ICD-10-CM

## 2023-07-25 DIAGNOSIS — S0181XA Laceration without foreign body of other part of head, initial encounter: Secondary | ICD-10-CM | POA: Diagnosis not present

## 2023-07-25 DIAGNOSIS — I10 Essential (primary) hypertension: Secondary | ICD-10-CM | POA: Diagnosis not present

## 2023-07-25 DIAGNOSIS — Y92129 Unspecified place in nursing home as the place of occurrence of the external cause: Secondary | ICD-10-CM | POA: Diagnosis not present

## 2023-07-25 DIAGNOSIS — W050XXA Fall from non-moving wheelchair, initial encounter: Secondary | ICD-10-CM | POA: Diagnosis not present

## 2023-07-25 DIAGNOSIS — Z91040 Latex allergy status: Secondary | ICD-10-CM | POA: Diagnosis not present

## 2023-07-25 DIAGNOSIS — Z23 Encounter for immunization: Secondary | ICD-10-CM | POA: Insufficient documentation

## 2023-07-25 DIAGNOSIS — Z8659 Personal history of other mental and behavioral disorders: Secondary | ICD-10-CM | POA: Diagnosis not present

## 2023-07-25 DIAGNOSIS — Z79899 Other long term (current) drug therapy: Secondary | ICD-10-CM | POA: Insufficient documentation

## 2023-07-25 DIAGNOSIS — R030 Elevated blood-pressure reading, without diagnosis of hypertension: Secondary | ICD-10-CM

## 2023-07-25 MED ORDER — TETANUS-DIPHTH-ACELL PERTUSSIS 5-2.5-18.5 LF-MCG/0.5 IM SUSY
0.5000 mL | PREFILLED_SYRINGE | Freq: Once | INTRAMUSCULAR | Status: AC
  Administered 2023-07-25: 0.5 mL via INTRAMUSCULAR
  Filled 2023-07-25: qty 0.5

## 2023-07-25 MED ORDER — LIDOCAINE-EPINEPHRINE (PF) 2 %-1:200000 IJ SOLN
20.0000 mL | Freq: Once | INTRAMUSCULAR | Status: AC
  Administered 2023-07-25: 20 mL
  Filled 2023-07-25: qty 20

## 2023-07-25 NOTE — ED Provider Notes (Signed)
 [place] EMERGENCY DEPARTMENT AT [facility] Provider Note   CSN: [id number] Arrival date & time: 07/25/23  1421     History  Chief Complaint  Patient presents with   Monica Ayers is a 63 y.o. female.  Pt arrives via EMs s/p fall at snf. Per report, pt had fallen from wheelchair, hit head. No loc. Since incident pts mental status and functional status reported as being c/w baseline. Hx advanced dementia, not ambulatory at baseline, contractures. On arrival to ED patient appears alert, content, and in no acute distress. Last tetanus not known. Pt not able provide additional history - level 5 caveat. No report of fever. No report of recent acute illness. No report of vomiting.   The history is provided by the patient, the nursing home, medical records and the EMS personnel. The history is limited by the condition of the patient.  Fall       Home Medications Prior to Admission medications   Medication Sig Start Date End Date Taking? Authorizing Provider  acetaminophen (TYLENOL) 325 MG tablet Take 2 tablets (650 mg total) by mouth every 6 (six) hours as needed for mild pain, fever or headache (or Fever >/= 101). 12/30/18   Shon Hale, MD  AMINO ACIDS COMPLEX PO Take by mouth.    [provider]  ascorbic acid (VITAMIN C) 500 MG tablet Take 500 mg by mouth daily.    [provider]  cholecalciferol (VITAMIN D3) 25 MCG (1000 UNIT) tablet Take 2,000 Units by mouth daily.    [provider]  melatonin 3 MG TABS tablet Take 9 mg by mouth at bedtime.    [provider]  methocarbamol (ROBAXIN) 750 MG tablet Take 750 mg by mouth every evening. 02/24/22   [provider]  saccharomyces boulardii (FLORASTOR) 250 MG capsule Take 250 mg by mouth 2 (two) times daily.    [provider]  senna (SENOKOT) 8.6 MG TABS tablet Take 1 tablet (8.6 mg total) by mouth 2 (two) times daily as needed for mild constipation.  05/12/23   Vassie Loll, MD  valsartan (DIOVAN) 160 MG tablet Take 1 tablet (160 mg total) by mouth daily. 07/02/23   Nyoka Cowden, MD  Zinc 220 (50 Zn) MG CAPS Take by mouth.    [provider]      Allergies    Latex    Review of Systems   Review of Systems  Unable to perform ROS: Dementia    Physical Exam Updated Vital Signs BP (!) 176/100   Pulse (!) 114   Resp (!) 25   Ht 1.702 m (5\' 7" )   Wt 59.4 kg   SpO2 100%   BMI 20.51 kg/m  Physical Exam Vitals and nursing note reviewed.  Constitutional:      Appearance: Normal appearance. She is well-developed.  HENT:     Head:     Comments: Laceration to right eyebrow/forehead, ~ 3 cm. No fb seen or felt. Facial bones and orbits appear grossly intact. No nosebleeding, no nasal septal hematoma.     Right Ear: Tympanic membrane normal.     Left Ear: Tympanic membrane normal.     Nose: Nose normal.     Mouth/Throat:     Mouth: Mucous membranes are moist.     Pharynx: Oropharynx is clear.  Eyes:     General: No scleral icterus.    Conjunctiva/sclera: Conjunctivae normal.     Pupils: Pupils are equal,  round, and reactive to light.  Neck:     Trachea: No tracheal deviation.     Comments: Moves neck freely/comfortably in all directions. No stiffness, rigidity or apparent tenderness. Trachea midline.  Cardiovascular:     Rate and Rhythm: Normal rate and regular rhythm.     Pulses: Normal pulses.     Heart sounds: Normal heart sounds. No murmur heard.    No friction rub. No gallop.  Pulmonary:     Effort: Pulmonary effort is normal. No respiratory distress.     Breath sounds: Normal breath sounds.  Chest:     Chest wall: No tenderness.  Abdominal:     General: There is no distension.     Palpations: Abdomen is soft.     Tenderness: There is no abdominal tenderness.  Genitourinary:    Comments: No cva tenderness.  Musculoskeletal:        General: No swelling or tenderness.     Cervical back: Normal range of  motion and neck supple. No rigidity or tenderness. No muscular tenderness.     Comments: Extremity contractures (reported at baseline for pt). No focal extremity soft tissue swelling, bruising/contusion or tenderness.  No skin wounds, infection or cellulitis noted.   Skin:    General: Skin is warm and dry.     Findings: No rash.  Neurological:     Mental Status: She is alert.     Comments: Appears awake and alert, content appearing. Is reported non verbal at baseline with contractures.   Psychiatric:        Mood and Affect: Mood normal.     ED Results / Procedures / Treatments   Labs (all labs ordered are listed, but only abnormal results are displayed) Labs Reviewed - No data to display  EKG None  Radiology No results found.  Procedures .Laceration Repair  Date/Time: 07/25/2023 4:39 PM  Performed by: Cathren Laine, MD Authorized by: Cathren Laine, MD   Consent:    Consent given by:  Patient Anesthesia:    Anesthesia method:  Local infiltration   Local anesthetic:  Lidocaine 2% WITH epi Laceration details:    Location:  Face   Face location:  Forehead   Length (cm):  3 Pre-procedure details:    Preparation:  Patient was prepped and draped in usual sterile fashion Exploration:    Imaging outcome: foreign body not noted     Wound exploration: entire depth of wound visualized   Treatment:    Area cleansed with:  Saline   Amount of cleaning:  Standard   Irrigation solution:  Sterile saline Skin repair:    Repair method:  Sutures   Suture size:  5-0   Suture material:  Prolene   Suture technique:  Simple interrupted   Number of sutures:  5 Post-procedure details:    Dressing:  Antibiotic ointment   Procedure completion:  Tolerated well, no immediate complications     Medications Ordered in ED Medications  lidocaine-EPINEPHrine (XYLOCAINE W/EPI) 2 %-1:200000 (PF) injection 20 mL (20 mLs Infiltration Given 07/25/23 1530)  Tdap (BOOSTRIX) injection 0.5 mL (0.5  mLs Intramuscular Given 07/25/23 1530)    ED Course/ Medical Decision Making/ A&P                                 Medical Decision Making Problems Addressed: Contusion of forehead, initial encounter: acute illness or injury Elevated blood pressure reading: acute illness or injury Essential  hypertension: chronic illness or injury with exacerbation, progression, or side effects of treatment that poses a threat to life or bodily functions Fall from wheelchair, initial encounter: acute illness or injury with systemic symptoms that poses a threat to life or bodily functions History of dementia: chronic illness or injury that poses a threat to life or bodily functions Laceration of forehead, initial encounter: acute illness or injury  Amount and/or Complexity of Data Reviewed Independent Historian: EMS    Details: hx External Data Reviewed: notes.  Risk Prescription drug management.   Reviewed nursing notes and prior charts for additional history.   No loc reported, and mental status/functional ability reported as remaining c/w baseline since fall.  From prior visits/records, it appears pts baseline hr in 100-10 range. (Currently 104). Hx htn.   Laceration repair.   Recheck, continues to be content, alert, no distress.   Pt currently appears stable for d/c back to SNF.   Rec pcp f/u.  Return precautions provided.           Final Clinical Impression(s) / ED Diagnoses Final diagnoses:  None    Rx / DC Orders ED Discharge Orders     None         Cathren Laine, MD 07/25/23 1644

## 2023-07-25 NOTE — ED Triage Notes (Signed)
 Pt BIB [facility] for a fall at [facility]. Pt fell out of wheel chair, laceration noted to rt eyebrow, per staff pt grimaces with movement to rt leg extension. Pt is non-verbal at baseline. Pt is contracted at baseline.

## 2023-07-25 NOTE — Discharge Instructions (Signed)
 It was our pleasure to provide your ER care today - we hope that you feel better.  Keep wound very clean and dry.   Have sutures removed, your doctor or urgent care, in 9-10  days.   Return to ER if worse, new symptoms, fevers, infection of wound, new or severe pain, persistent vomiting, change in mental status, or other concern.

## 2023-12-08 ENCOUNTER — Encounter (HOSPITAL_BASED_OUTPATIENT_CLINIC_OR_DEPARTMENT_OTHER): Attending: Internal Medicine | Admitting: Internal Medicine

## 2023-12-08 DIAGNOSIS — G3109 Other frontotemporal dementia: Secondary | ICD-10-CM | POA: Diagnosis not present

## 2023-12-08 DIAGNOSIS — E11622 Type 2 diabetes mellitus with other skin ulcer: Secondary | ICD-10-CM | POA: Diagnosis present

## 2023-12-08 DIAGNOSIS — L89154 Pressure ulcer of sacral region, stage 4: Secondary | ICD-10-CM | POA: Diagnosis not present

## 2024-01-05 ENCOUNTER — Encounter (HOSPITAL_BASED_OUTPATIENT_CLINIC_OR_DEPARTMENT_OTHER): Attending: Internal Medicine | Admitting: Internal Medicine

## 2024-01-05 DIAGNOSIS — Z993 Dependence on wheelchair: Secondary | ICD-10-CM | POA: Insufficient documentation

## 2024-01-05 DIAGNOSIS — E11622 Type 2 diabetes mellitus with other skin ulcer: Secondary | ICD-10-CM | POA: Diagnosis not present

## 2024-01-05 DIAGNOSIS — L89154 Pressure ulcer of sacral region, stage 4: Secondary | ICD-10-CM | POA: Diagnosis not present

## 2024-01-05 DIAGNOSIS — Z7984 Long term (current) use of oral hypoglycemic drugs: Secondary | ICD-10-CM | POA: Insufficient documentation

## 2024-01-05 DIAGNOSIS — G3109 Other frontotemporal dementia: Secondary | ICD-10-CM | POA: Insufficient documentation

## 2024-02-07 ENCOUNTER — Encounter (HOSPITAL_BASED_OUTPATIENT_CLINIC_OR_DEPARTMENT_OTHER): Attending: Internal Medicine | Admitting: Internal Medicine

## 2024-02-07 DIAGNOSIS — E11622 Type 2 diabetes mellitus with other skin ulcer: Secondary | ICD-10-CM | POA: Diagnosis present

## 2024-02-07 DIAGNOSIS — G3109 Other frontotemporal dementia: Secondary | ICD-10-CM | POA: Diagnosis not present

## 2024-02-07 DIAGNOSIS — L89154 Pressure ulcer of sacral region, stage 4: Secondary | ICD-10-CM | POA: Insufficient documentation

## 2024-03-06 ENCOUNTER — Encounter (HOSPITAL_BASED_OUTPATIENT_CLINIC_OR_DEPARTMENT_OTHER): Attending: Internal Medicine | Admitting: Internal Medicine

## 2024-05-04 DEATH — deceased
